# Patient Record
Sex: Male | Born: 1965
Health system: Southern US, Community
[De-identification: ages and names within clinical notes are randomized; demographics above are authoritative.]

## PROBLEM LIST (undated history)

## (undated) DIAGNOSIS — Z973 Presence of spectacles and contact lenses: Secondary | ICD-10-CM

## (undated) DIAGNOSIS — I739 Peripheral vascular disease, unspecified: Secondary | ICD-10-CM

## (undated) DIAGNOSIS — I10 Essential (primary) hypertension: Secondary | ICD-10-CM

## (undated) DIAGNOSIS — R112 Nausea with vomiting, unspecified: Secondary | ICD-10-CM

## (undated) DIAGNOSIS — I779 Disorder of arteries and arterioles, unspecified: Secondary | ICD-10-CM

## (undated) DIAGNOSIS — I2510 Atherosclerotic heart disease of native coronary artery without angina pectoris: Secondary | ICD-10-CM

## (undated) DIAGNOSIS — F32A Depression, unspecified: Secondary | ICD-10-CM

## (undated) DIAGNOSIS — Z87442 Personal history of urinary calculi: Secondary | ICD-10-CM

## (undated) DIAGNOSIS — E785 Hyperlipidemia, unspecified: Secondary | ICD-10-CM

## (undated) DIAGNOSIS — I2089 Other forms of angina pectoris: Secondary | ICD-10-CM

## (undated) DIAGNOSIS — Z9289 Personal history of other medical treatment: Secondary | ICD-10-CM

## (undated) DIAGNOSIS — Z9889 Other specified postprocedural states: Secondary | ICD-10-CM

## (undated) DIAGNOSIS — M1990 Unspecified osteoarthritis, unspecified site: Secondary | ICD-10-CM

## (undated) DIAGNOSIS — F329 Major depressive disorder, single episode, unspecified: Secondary | ICD-10-CM

## (undated) DIAGNOSIS — T148XXA Other injury of unspecified body region, initial encounter: Secondary | ICD-10-CM

## (undated) DIAGNOSIS — I119 Hypertensive heart disease without heart failure: Secondary | ICD-10-CM

## (undated) DIAGNOSIS — I208 Other forms of angina pectoris: Secondary | ICD-10-CM

## (undated) HISTORY — PX: HAND SURGERY: SHX662

## (undated) HISTORY — PX: CORONARY ARTERY BYPASS GRAFT: SHX141

## (undated) HISTORY — PX: COLONOSCOPY: SHX174

## (undated) HISTORY — DX: Hyperlipidemia, unspecified: E78.5

## (undated) HISTORY — PX: SHOULDER SURGERY: SHX246

## (undated) HISTORY — DX: Disorder of arteries and arterioles, unspecified: I77.9

## (undated) HISTORY — DX: Other forms of angina pectoris: I20.89

## (undated) HISTORY — DX: Essential (primary) hypertension: I10

## (undated) HISTORY — DX: Atherosclerotic heart disease of native coronary artery without angina pectoris: I25.10

## (undated) HISTORY — DX: Peripheral vascular disease, unspecified: I73.9

## (undated) HISTORY — DX: Hypertensive heart disease without heart failure: I11.9

## (undated) HISTORY — DX: Personal history of other medical treatment: Z92.89

## (undated) HISTORY — DX: Other forms of angina pectoris: I20.8

## (undated) HISTORY — PX: KIDNEY STONE SURGERY: SHX686

## (undated) HISTORY — PX: HIP ARTHROPLASTY: SHX981

---
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
## 1999-12-27 HISTORY — PX: SHOULDER ARTHROSCOPY: SHX128

## 2003-12-27 HISTORY — PX: CARDIAC CATHETERIZATION: SHX172

## 2011-09-19 ENCOUNTER — Other Ambulatory Visit: Payer: Self-pay | Admitting: Internal Medicine

## 2011-09-19 DIAGNOSIS — E230 Hypopituitarism: Secondary | ICD-10-CM

## 2011-09-27 ENCOUNTER — Ambulatory Visit (HOSPITAL_COMMUNITY)
Admission: RE | Admit: 2011-09-27 | Discharge: 2011-09-27 | Disposition: A | Discharge status: Home / Self Care | Payer: PRIVATE HEALTH INSURANCE | Source: Ambulatory Visit | Attending: Internal Medicine | Admitting: Internal Medicine

## 2011-09-27 DIAGNOSIS — R930 Abnormal findings on diagnostic imaging of skull and head, not elsewhere classified: Secondary | ICD-10-CM | POA: Insufficient documentation

## 2011-09-27 DIAGNOSIS — E230 Hypopituitarism: Secondary | ICD-10-CM

## 2011-09-27 DIAGNOSIS — E291 Testicular hypofunction: Secondary | ICD-10-CM | POA: Insufficient documentation

## 2011-09-27 LAB — CREATININE, SERUM
Creatinine, Ser: 1.13 mg/dL (ref 0.50–1.35)
GFR calc Af Amer: 88 mL/min — ABNORMAL LOW (ref >90)
GFR calc non Af Amer: 76 mL/min — ABNORMAL LOW (ref >90)

## 2011-09-27 MED ORDER — GADOBENATE DIMEGLUMINE 529 MG/ML IV SOLN
20.0000 mL | Freq: Once | INTRAVENOUS | Status: AC
  Administered 2011-09-27: 20 mL via INTRAVENOUS

## 2013-06-05 ENCOUNTER — Other Ambulatory Visit: Payer: Self-pay | Admitting: Internal Medicine

## 2013-06-05 NOTE — Telephone Encounter (Signed)
Need approval or denial

## 2013-06-07 NOTE — Telephone Encounter (Signed)
Just checked with the pharmacist-his medicine is still not ready-Please call-need his medicine for the week-end-Please call today!

## 2013-06-10 NOTE — Telephone Encounter (Signed)
Still have not gotten his Ambien-been waiting since last week-please call this in today-[phone]!

## 2013-06-10 NOTE — Telephone Encounter (Signed)
Advise on Ambien refills

## 2013-06-11 NOTE — Telephone Encounter (Signed)
He will need a "hard" Rx for ambien. I can sign in the office on Thursday.  -[place]

## 2013-06-12 NOTE — Telephone Encounter (Signed)
Rx for [facility] faxed to [facility] [address]

## 2013-06-12 NOTE — Telephone Encounter (Signed)
Done

## 2013-06-20 ENCOUNTER — Ambulatory Visit: Payer: PRIVATE HEALTH INSURANCE | Admitting: Internal Medicine

## 2013-06-21 ENCOUNTER — Other Ambulatory Visit: Payer: Self-pay | Admitting: Internal Medicine

## 2013-06-21 NOTE — Telephone Encounter (Signed)
Rx was sent to pharmacy electronically. 

## 2013-06-26 ENCOUNTER — Encounter: Payer: Self-pay | Admitting: Internal Medicine

## 2013-06-26 ENCOUNTER — Ambulatory Visit (INDEPENDENT_AMBULATORY_CARE_PROVIDER_SITE_OTHER): Payer: Managed Care, Other (non HMO) | Admitting: Internal Medicine

## 2013-06-26 VITALS — BP 140/100 | HR 78 | Ht 69.0 in | Wt 230.6 lb

## 2013-06-26 DIAGNOSIS — E785 Hyperlipidemia, unspecified: Secondary | ICD-10-CM

## 2013-06-26 DIAGNOSIS — I209 Angina pectoris, unspecified: Secondary | ICD-10-CM

## 2013-06-26 DIAGNOSIS — R0989 Other specified symptoms and signs involving the circulatory and respiratory systems: Secondary | ICD-10-CM

## 2013-06-26 DIAGNOSIS — R079 Chest pain, unspecified: Secondary | ICD-10-CM

## 2013-06-26 DIAGNOSIS — I10 Essential (primary) hypertension: Secondary | ICD-10-CM

## 2013-06-26 DIAGNOSIS — I208 Other forms of angina pectoris: Secondary | ICD-10-CM | POA: Insufficient documentation

## 2013-06-26 DIAGNOSIS — E669 Obesity, unspecified: Secondary | ICD-10-CM

## 2013-06-26 MED ORDER — ATORVASTATIN CALCIUM 80 MG PO TABS: ORAL_TABLET | ORAL | Status: DC

## 2013-06-26 MED ORDER — RANOLAZINE ER 1000 MG PO TB12: 1000.0000 mg | ORAL_TABLET | Freq: Two times a day (BID) | ORAL | Status: DC

## 2013-06-26 NOTE — Patient Instructions (Addendum)
Your physician has requested that you have a carotid duplex. This test is an ultrasound of the carotid arteries in your neck. It looks at blood flow through these arteries that supply the brain with blood. Allow one hour for this exam. There are no restrictions or special instructions.  We have sent a refill for Ranexa to your pharmacy.   Your physician wants you to follow-up in 2-3 weeks.

## 2013-06-26 NOTE — Progress Notes (Signed)
OFFICE NOTE  Chief Complaint:  Followup  Primary Care Physician: Romeo Rabon, MD  HPI:  Kenneth [name] is a 47 year old male who had been seen by [place] in consult previously. Dr. Rennis Golden saw him in August 2012. He had been having angina. He reportedly had normal epicardial coronaries at catheterization in [place]. He had an extensive workup there that included cardiac and GI evaluation. He was diagnosed with microvascular angina. He had been maintained on nitroglycerin, which helped. He actually even had EECP in the past which he said helped as well. He recently moved to the area and wanted to get established with a cardiologist. Dr. Rennis Golden recommended resuming his Ranexa. He also resumed his nitroglycerin patch and refilled his amlodipine. No functional studies have been done at our office. He was referred back to Dr. Yetta Barre for followup. He saw Corine Shelter, PA-C recently due to an elevated blood pressure. He has had some vague headache with this but no shortness of breath or chest pain. He had stopped taking his lisinopril. Since resumed taking his lisinopril, but his blood pressure remains elevated. Today the blood pressure was 140/100 in the left arm and 136/96 in the right arm.  [name] is also complaining about left neck pain and the feeling of pulsation in his left neck. A soft bruit has been noted at the left neck base in the past, but no evaluation has been performed. Fortunately he recently has Nurse, learning disability and is interested in restarting his Ranexa.  He has also lost 20-30 pounds recently which should help his blood pressure.  PMHx:  Past Medical History  Diagnosis Date  . Syndrome X, cardiac   . Hypertension   . Hyperlipidemia     Past Surgical History  Procedure Laterality Date  . Kidney stone surgery  age 68  . Shoulder arthroscopy  2001    L shoulder  . Cardiac catheterization  2005    FAMHx:  Family History  Problem Relation Age of Onset  . Heart attack  Father   . Cancer Father     lung  . Heart attack Mother   . Stroke Mother   . Diabetes Mother     SOCHx:   reports that he quit smoking about a year ago. His smoking use included Cigars. He has never used smokeless tobacco. He reports that he does not drink alcohol or use illicit drugs.  ALLERGIES:  Allergies  Allergen Reactions  . Tetracyclines & Related Nausea And Vomiting    ROS: A comprehensive review of systems was negative except for: Cardiovascular: positive for chest pain Musculoskeletal: positive for neck pain  HOME MEDS: Current Outpatient Prescriptions  Medication Sig Dispense Refill  . aspirin 325 MG tablet Take 325 mg by mouth 2 (two) times daily.      Marland Kitchen atorvastatin (LIPITOR) 80 MG tablet TAKE 1 TABLET AT BEDTIME  90 tablet  3  . gemfibrozil (LOPID) 600 MG tablet Take 600 mg by mouth 2 (two) times daily before a meal.      . lisinopril (PRINIVIL,ZESTRIL) 20 MG tablet Take 20 mg by mouth daily.      . nitroGLYCERIN (NITRODUR - DOSED IN MG/24 HR) 0.4 mg/hr Place 1 patch onto the skin daily.      . nitroGLYCERIN (NITROSTAT) 0.4 MG SL tablet Place 0.4 mg under the tongue every 5 (five) minutes as needed for chest pain.      . ranolazine (RANEXA) 1000 MG SR tablet Take 1 tablet (1,000 mg total) by mouth 2 (  two) times daily.  56 tablet  0  . TESTOSTERONE IM Inject 1.5 mLs into the muscle every 14 (fourteen) days.      Marland Kitchen zolpidem (AMBIEN) 10 MG tablet TAKE 1 TABLET AT BEDTIME  30 tablet  5   No current facility-administered medications for this visit.    LABS/IMAGING: No results found for this or any previous visit (from the past 48 hour(s)). No results found.  VITALS: BP 140/100  Pulse 78  Ht 5\' 9"  (1.753 m)  Wt 230 lb 9.6 oz (104.599 kg)  BMI 34.04 kg/m2  EXAM: General appearance: alert and no distress Neck: no adenopathy, no JVD, supple, symmetrical, trachea midline, thyroid not enlarged, symmetric, no tenderness/mass/nodules and Soft bruit at the left  neck base Lungs: clear to auscultation bilaterally Heart: regular rate and rhythm, S1, S2 normal, no murmur, click, rub or gallop Abdomen: soft, non-tender; bowel sounds normal; no masses,  no organomegaly Extremities: extremities normal, atraumatic, no cyanosis or edema Pulses: 2+ and symmetric Skin: Skin color, texture, turgor normal. No rashes or lesions Neurologic: Grossly normal  EKG: Normal sinus rhythm at 78  ASSESSMENT: 1. Microvascular angina 2. Hypertension-uncontrolled 3. Hyperlipidemia 4. Obesity  PLAN: 1.   Mr. Piccininni's blood pressure is improved over his last visit since restarting lisinopril, but it is not at goal. He continues to have some mild angina, but it is improved. He wishes to restart his Ranexa now that he is insurance and we will provide him with samples and a co-pay card today. I think part of his issue is high LVEDP, and he certainly could have better blood pressure control. Process seems to be a differential between both arms and blood pressure, and his could be related to the soft bruit which may in fact be subclavian stenosis. I would like to obtain a carotid Dopplers to further evaluate this. We'll see him back in a few weeks and recheck his bilateral blood pressures. We may have to add low-dose HCTZ to his lisinopril for better blood pressure control.  Chrystie Nose, MD, [facility] Attending Cardiologist [facility]  Juanmanuel Marohl C 06/26/2013, 5:19 PM

## 2013-06-27 ENCOUNTER — Other Ambulatory Visit: Payer: Self-pay | Admitting: *Deleted

## 2013-06-27 MED ORDER — LISINOPRIL 20 MG PO TABS: 20.0000 mg | ORAL_TABLET | Freq: Every day | ORAL | Status: DC

## 2013-06-27 NOTE — Telephone Encounter (Signed)
Rx refill sent to pharmacy electronically.  

## 2013-07-05 ENCOUNTER — Encounter (HOSPITAL_COMMUNITY): Payer: Managed Care, Other (non HMO)

## 2013-07-09 ENCOUNTER — Ambulatory Visit (HOSPITAL_COMMUNITY)
Admission: RE | Admit: 2013-07-09 | Discharge: 2013-07-09 | Disposition: A | Discharge status: Home / Self Care | Payer: Managed Care, Other (non HMO) | Source: Ambulatory Visit | Attending: Internal Medicine | Admitting: Internal Medicine

## 2013-07-09 DIAGNOSIS — R0989 Other specified symptoms and signs involving the circulatory and respiratory systems: Secondary | ICD-10-CM | POA: Insufficient documentation

## 2013-07-09 NOTE — Progress Notes (Signed)
Carotid Duplex Completed. °Kenneth [name] ° °

## 2013-07-16 ENCOUNTER — Encounter: Payer: Self-pay | Admitting: *Deleted

## 2013-07-18 ENCOUNTER — Ambulatory Visit (INDEPENDENT_AMBULATORY_CARE_PROVIDER_SITE_OTHER): Payer: Managed Care, Other (non HMO) | Admitting: Internal Medicine

## 2013-07-18 ENCOUNTER — Encounter: Payer: Self-pay | Admitting: Internal Medicine

## 2013-07-18 VITALS — BP 130/82 | HR 72 | Ht 69.0 in | Wt 229.5 lb

## 2013-07-18 DIAGNOSIS — E785 Hyperlipidemia, unspecified: Secondary | ICD-10-CM

## 2013-07-18 DIAGNOSIS — I208 Other forms of angina pectoris: Secondary | ICD-10-CM

## 2013-07-18 DIAGNOSIS — I209 Angina pectoris, unspecified: Secondary | ICD-10-CM

## 2013-07-18 DIAGNOSIS — I10 Essential (primary) hypertension: Secondary | ICD-10-CM

## 2013-07-18 DIAGNOSIS — M542 Cervicalgia: Secondary | ICD-10-CM

## 2013-07-18 NOTE — Patient Instructions (Addendum)
Your physician wants you to follow-up in: 1 year. You will receive a reminder letter in the mail two months in advance. If you don't receive a letter, please call our office to schedule the follow-up appointment.  Dr Rennis Golden has referred you to Dr. Gerlene Fee at [facility]

## 2013-07-18 NOTE — Progress Notes (Signed)
OFFICE NOTE  Chief Complaint:  Followup  Primary Care Physician: Romeo Rabon, MD  HPI:  Kenneth [name] is a 47 year old male who had been seen by [place] in consult previously. Dr. Rennis Golden saw him in August 2012. He had been having angina. He reportedly had normal epicardial coronaries at catheterization in [place]. He had an extensive workup there that included cardiac and GI evaluation. He was diagnosed with microvascular angina. He had been maintained on nitroglycerin, which helped. He actually even had EECP in the past which he said helped as well. He recently moved to the area and wanted to get established with a cardiologist. Dr. Rennis Golden recommended resuming his Ranexa. He also resumed his nitroglycerin patch and refilled his amlodipine. No functional studies have been done at our office. He was referred back to Dr. Yetta Barre for followup. He saw Corine Shelter, PA-C recently due to an elevated blood pressure. He has had some vague headache with this but no shortness of breath or chest pain. He had stopped taking his lisinopril. Since resumed taking his lisinopril, but his blood pressure remains elevated. Today the blood pressure was 140/100 in the left arm and 136/96 in the right arm.  Kenneth [name] is also complaining about left neck pain and the feeling of pulsation in his left neck. A soft bruit has been noted at the left neck base in the past, but no evaluation has been performed. Fortunately he recently has Nurse, learning disability and is interested in restarting his Ranexa.  He has also lost 20-30 pounds recently which should help his blood pressure.  At his last office visit, he was complaining of left posterior neck pain that radiated up the back of the left neck base. This is a somewhat throbbing in quality. It was improved with ibuprofen but not nitroglycerin. He is concerned about carotid or vertebral artery disease. We did go ahead and order carotid Dopplers which were negative for blockage, although  he did have mildly elevated velocities in both subclavian arteries.  PMHx:  Past Medical History  Diagnosis Date  . Syndrome X, cardiac   . Hypertension   . Hyperlipidemia   . History of Doppler ultrasound 07/09/2013    carotid doppler; moderate narrowing of both subclavian arteries, with normal carotid arteries    Past Surgical History  Procedure Laterality Date  . Kidney stone surgery  age 75  . Shoulder arthroscopy  2001    L shoulder  . Cardiac catheterization  2005    FAMHx:  Family History  Problem Relation Age of Onset  . Heart attack Father   . Cancer Father     lung  . Heart attack Mother   . Stroke Mother   . Diabetes Mother     SOCHx:   reports that he quit smoking about 12 months ago. His smoking use included Cigars. He has never used smokeless tobacco. He reports that he does not drink alcohol or use illicit drugs.  ALLERGIES:  Allergies  Allergen Reactions  . Tetracyclines & Related Nausea And Vomiting    ROS: A comprehensive review of systems was negative except for: Cardiovascular: positive for chest pain Musculoskeletal: positive for neck pain  HOME MEDS: Current Outpatient Prescriptions  Medication Sig Dispense Refill  . aspirin 325 MG tablet Take 325 mg by mouth 2 (two) times daily.      Marland Kitchen atorvastatin (LIPITOR) 80 MG tablet TAKE 1 TABLET AT BEDTIME  90 tablet  3  . gemfibrozil (LOPID) 600 MG tablet Take 600 mg by mouth  2 (two) times daily before a meal.      . lisinopril (PRINIVIL,ZESTRIL) 20 MG tablet Take 1 tablet (20 mg total) by mouth daily.  90 tablet  6  . nitroGLYCERIN (NITRODUR - DOSED IN MG/24 HR) 0.4 mg/hr Place 1 patch onto the skin daily.      . nitroGLYCERIN (NITROSTAT) 0.4 MG SL tablet Place 0.4 mg under the tongue every 5 (five) minutes as needed for chest pain.      . ranolazine (RANEXA) 1000 MG SR tablet Take 1 tablet (1,000 mg total) by mouth 2 (two) times daily.  56 tablet  0  . TESTOSTERONE IM Inject 1.5 mLs into the muscle  every 14 (fourteen) days.      Marland Kitchen zolpidem (AMBIEN) 10 MG tablet TAKE 1 TABLET AT BEDTIME  30 tablet  5   No current facility-administered medications for this visit.    LABS/IMAGING: No results found for this or any previous visit (from the past 48 hour(s)). No results found.  VITALS: BP 130/82  Pulse 72  Ht 5\' 9"  (1.753 m)  Wt 229 lb 8 oz (104.101 kg)  BMI 33.88 kg/m2  EXAM: deferred  EKG: deferred  ASSESSMENT: 1. Microvascular angina 2. Hypertension-uncontrolled 3. Hyperlipidemia 4. Obesity  PLAN: 1.   Kenneth [name]'s carotid Dopplers are essentially normal. 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 I recommended that he see a neurosurgeon (Dr. Gerlene Fee) for evaluation of cervical neuralgia. He may benefit from further imaging, physical therapy and/or injections. His blood pressure is better controlled today, therefore will continue his current medications. He continues to be angina free and exercise regularly. Plan to see him back annually or sooner as necessary.  Chrystie Nose, MD, [facility] Attending Cardiologist [facility]  Elza Sortor C 07/18/2013, 2:03 PM

## 2013-09-15 ENCOUNTER — Other Ambulatory Visit: Payer: Self-pay | Admitting: Internal Medicine

## 2013-09-16 NOTE — Telephone Encounter (Signed)
Rx was sent to pharmacy electronically. 

## 2013-11-01 ENCOUNTER — Other Ambulatory Visit: Payer: Self-pay | Admitting: Internal Medicine

## 2013-11-01 NOTE — Telephone Encounter (Signed)
Rx was sent to pharmacy electronically. 

## 2013-11-12 ENCOUNTER — Other Ambulatory Visit: Payer: Self-pay | Admitting: *Deleted

## 2013-11-12 MED ORDER — ZOLPIDEM TARTRATE 10 MG PO TABS: 10.0000 mg | ORAL_TABLET | Freq: Every evening | ORAL | Status: DC | PRN

## 2013-11-12 NOTE — Telephone Encounter (Signed)
Medication refilled #30 with 5 refills. Printed and faxed to [facility] [address] [place], [place]

## 2013-12-09 ENCOUNTER — Telehealth: Payer: Self-pay | Admitting: Internal Medicine

## 2013-12-09 MED ORDER — NITROGLYCERIN 0.4 MG SL SUBL: 0.4000 mg | SUBLINGUAL_TABLET | SUBLINGUAL | Status: DC | PRN

## 2013-12-09 NOTE — Telephone Encounter (Signed)
Message forwarded to J. Elkins, RN.  

## 2013-12-09 NOTE — Telephone Encounter (Signed)
Need a new prescription for sublingual nitro-glycerin 0.4mg  #100

## 2013-12-09 NOTE — Telephone Encounter (Signed)
Refill for NTG SL since #25 with 3 refills to [facility]

## 2013-12-13 ENCOUNTER — Telehealth: Payer: Self-pay | Admitting: Internal Medicine

## 2013-12-13 MED ORDER — NITROGLYCERIN 0.4 MG SL SUBL: 0.4000 mg | SUBLINGUAL_TABLET | SUBLINGUAL | Status: DC | PRN

## 2013-12-13 NOTE — Telephone Encounter (Signed)
Need new perscriptiion for Nitro stat.4mg  #100

## 2013-12-13 NOTE — Telephone Encounter (Signed)
Attempted to call patient to ask how often NTG is being taken - voicemail box is full

## 2013-12-13 NOTE — Telephone Encounter (Signed)
Refill sent for #25 w/ 3 refills on 12.15.14.  Pharmacy asking for #100.  Message forwarded to J. Jeannetta Nap, Charity fundraiser.

## 2013-12-13 NOTE — Telephone Encounter (Signed)
Rx was sent to pharmacy electronically. 

## 2014-01-28 ENCOUNTER — Encounter: Payer: Self-pay | Admitting: Internal Medicine

## 2014-01-28 ENCOUNTER — Encounter (HOSPITAL_COMMUNITY): Payer: Self-pay | Admitting: *Deleted

## 2014-01-28 ENCOUNTER — Ambulatory Visit (INDEPENDENT_AMBULATORY_CARE_PROVIDER_SITE_OTHER): Payer: Managed Care, Other (non HMO) | Admitting: Internal Medicine

## 2014-01-28 VITALS — BP 120/80 | HR 69 | Ht 69.0 in | Wt 223.9 lb

## 2014-01-28 DIAGNOSIS — R079 Chest pain, unspecified: Secondary | ICD-10-CM

## 2014-01-28 DIAGNOSIS — E785 Hyperlipidemia, unspecified: Secondary | ICD-10-CM

## 2014-01-28 DIAGNOSIS — I200 Unstable angina: Secondary | ICD-10-CM | POA: Insufficient documentation

## 2014-01-28 DIAGNOSIS — E669 Obesity, unspecified: Secondary | ICD-10-CM

## 2014-01-28 DIAGNOSIS — I10 Essential (primary) hypertension: Secondary | ICD-10-CM

## 2014-01-28 MED ORDER — NITROGLYCERIN 0.4 MG SL SUBL: 0.4000 mg | SUBLINGUAL_TABLET | SUBLINGUAL | Status: DC | PRN

## 2014-01-28 NOTE — Patient Instructions (Signed)
Your physician wants you to follow-up in: 6 months. You will receive a reminder letter in the mail two months in advance. If you don't receive a letter, please call our office to schedule the follow-up appointment.  Your physician has requested that you have en exercise stress myoview. For further information please visit [email or web address]. Please follow instruction sheet, as given.  Your physician recommends that you return for lab work at your earliest convenience. You will need to be fasting.

## 2014-01-28 NOTE — Progress Notes (Signed)
OFFICE NOTE  Chief Complaint:  Followup  Primary Care Physician: Moshe Cipro, MD  HPI:  Kenneth [name] is a 48 year old male who had been seen by [place] in consult previously. Dr. Debara Pickett saw him in August 2012. He had been having angina. He reportedly had normal epicardial coronaries at catheterization in [place]. He had an extensive workup there that included cardiac and GI evaluation. He was diagnosed with microvascular angina. He had been maintained on nitroglycerin, which helped. He actually even had EECP in the past which he said helped as well. He recently moved to the area and wanted to get established with a cardiologist. Dr. Debara Pickett recommended resuming his Ranexa. He also resumed his nitroglycerin patch and refilled his amlodipine. No functional studies have been done at our office. He was referred back to Dr. Ronnald Ramp for followup.  Since that time his blood pressure had improved significantly. Today is 120/80 as mentioned his weight has come down to 223.  He reports however about 2 weeks ago he thinks he "got out of [place] with his medications. He ran out of his nitroglycerin patch and was taking more short acting nitroglycerin. He says reestablished his medicines, but reports that he continues to have some worsening chest pain and shortness of breath. He continues to exercise but is noticing more anginal symptoms earlier in his exercise.  PMHx:  Past Medical History  Diagnosis Date  . Syndrome X, cardiac   . Hypertension   . Hyperlipidemia   . History of Doppler ultrasound 07/09/2013    carotid doppler; moderate narrowing of both subclavian arteries, with normal carotid arteries    Past Surgical History  Procedure Laterality Date  . Kidney stone surgery  age 13  . Shoulder arthroscopy  2001    L shoulder  . Cardiac catheterization  2005    FAMHx:  Family History  Problem Relation Age of Onset  . Heart attack Father   . Cancer Father     lung  . Heart attack Mother     . Stroke Mother   . Diabetes Mother     SOCHx:   reports that he quit smoking about 19 months ago. His smoking use included Cigars. He has never used smokeless tobacco. He reports that he does not drink alcohol or use illicit drugs.  ALLERGIES:  Allergies  Allergen Reactions  . Tetracyclines & Related Nausea And Vomiting    ROS: A comprehensive review of systems was negative except for: Cardiovascular: positive for exertional chest pressure/discomfort  HOME MEDS: Current Outpatient Prescriptions  Medication Sig Dispense Refill  . aspirin 81 MG tablet Take 81 mg by mouth 2 (two) times daily.      Marland Kitchen atorvastatin (LIPITOR) 80 MG tablet TAKE 1 TABLET AT BEDTIME  90 tablet  3  . gemfibrozil (LOPID) 600 MG tablet TAKE 1 TABLET BY MOUTH TWICE A DAY  60 tablet  6  . lisinopril (PRINIVIL,ZESTRIL) 20 MG tablet Take 1 tablet (20 mg total) by mouth daily.  90 tablet  6  . nitroGLYCERIN (NITRODUR - DOSED IN MG/24 HR) 0.4 mg/hr patch APPLY 2 PATCHES TO SKIN EVERY DAY  60 patch  4  . nitroGLYCERIN (NITROSTAT) 0.4 MG SL tablet Place 1 tablet (0.4 mg total) under the tongue every 5 (five) minutes as needed for chest pain.  100 tablet  2  . ranolazine (RANEXA) 1000 MG SR tablet Take 1 tablet (1,000 mg total) by mouth 2 (two) times daily.  56 tablet  0  . TESTOSTERONE  IM Inject 1.5 mLs into the muscle every 14 (fourteen) days.      Marland Kitchen zolpidem (AMBIEN) 10 MG tablet Take 1 tablet (10 mg total) by mouth at bedtime as needed for sleep.  30 tablet  5   No current facility-administered medications for this visit.    LABS/IMAGING: No results found for this or any previous visit (from the past 48 hour(s)). No results found.  VITALS: BP 120/80  Pulse 69  Ht 5\' 9"  (1.753 m)  Wt 223 lb 14.4 oz (101.56 kg)  BMI 33.05 kg/m2  EXAM: General appearance: alert and no distress Neck: no adenopathy, no JVD, supple, symmetrical, trachea midline, thyroid not enlarged, symmetric, no tenderness/mass/nodules and  Soft bruit at the left neck base Lungs: clear to auscultation bilaterally Heart: regular rate and rhythm, S1, S2 normal, no murmur, click, rub or gallop Abdomen: soft, non-tender; bowel sounds normal; no masses,  no organomegaly Extremities: extremities normal, atraumatic, no cyanosis or edema Pulses: 2+ and symmetric Skin: Skin color, texture, turgor normal. No rashes or lesions Neurologic: Grossly normal  EKG: Normal sinus rhythm at 69  ASSESSMENT: 1. Unstable angina 2. History of microvascular angina (normal coronaries by cath in 2010) - had EECP in the past 3. Hypertension-uncontrolled 4. Hyperlipidemia 5. Obesity  PLAN: 1.   Kenneth [name] is describing accelerating chest pain over the past few weeks for which he has scheduled this appointment today. He thought at first it may be due to coming off of his medicines or been out of them for short period of time, but after reestablishing his medicines his symptoms are predictably getting worse. His last stress test was in 2011 after cardiac catheterization in 2010 which was essentially normal. He has not had any workup as far as imaging test in our office since I've been seeing him in 2012. I therefore recommend a exercise nuclear stress test to further evaluate for the possible development of coronary disease, especially given his strong family history of heart disease. He is also due for another cholesterol check which will order this time. I'll contact her with the results of his stress test and cholesterol and make adjustments or further tests as necessary.  Pixie Casino, MD, [facility] Attending Cardiologist [facility] [place] [facility] 01/28/2014, 1:11 PM

## 2014-01-30 ENCOUNTER — Other Ambulatory Visit: Payer: Self-pay | Admitting: Internal Medicine

## 2014-01-30 LAB — COMPREHENSIVE METABOLIC PANEL
ALK PHOS: 45 U/L (ref 39–117)
ALT: 12 U/L (ref 0–53)
AST: 17 U/L (ref 0–37)
Albumin: 4.3 g/dL (ref 3.5–5.2)
BILIRUBIN TOTAL: 0.5 mg/dL (ref 0.2–1.2)
BUN: 23 mg/dL (ref 6–23)
CO2: 26 mEq/L (ref 19–32)
CREATININE: 1.13 mg/dL (ref 0.50–1.35)
Calcium: 9.7 mg/dL (ref 8.4–10.5)
Chloride: 102 mEq/L (ref 96–112)
Glucose, Bld: 98 mg/dL (ref 70–99)
Potassium: 4.3 mEq/L (ref 3.5–5.3)
SODIUM: 138 meq/L (ref 135–145)
TOTAL PROTEIN: 7.6 g/dL (ref 6.0–8.3)

## 2014-01-30 LAB — PSA: PSA: 0.96 ng/mL (ref <=4.00)

## 2014-01-30 LAB — CBC
HCT: 41.9 % (ref 39.0–52.0)
Hemoglobin: 14.7 g/dL (ref 13.0–17.0)
MCH: 30.7 pg (ref 26.0–34.0)
MCHC: 35.1 g/dL (ref 30.0–36.0)
MCV: 87.5 fL (ref 78.0–100.0)
PLATELETS: 288 10*3/uL (ref 150–400)
RBC: 4.79 MIL/uL (ref 4.22–5.81)
RDW: 13.9 % (ref 11.5–15.5)
WBC: 5.8 10*3/uL (ref 4.0–10.5)

## 2014-01-31 ENCOUNTER — Encounter (HOSPITAL_COMMUNITY): Payer: Managed Care, Other (non HMO)

## 2014-01-31 ENCOUNTER — Telehealth (HOSPITAL_COMMUNITY): Payer: Self-pay | Admitting: *Deleted

## 2014-01-31 LAB — NMR LIPOPROFILE WITH LIPIDS
CHOLESTEROL, TOTAL: 318 mg/dL — AB (ref <200)
HDL Particle Number: 22.8 umol/L — ABNORMAL LOW (ref >=30.5)
HDL Size: 7.8 nm — ABNORMAL LOW (ref >=9.2)
HDL-C: 42 mg/dL (ref >=40)
LDL CALC: 256 mg/dL — AB (ref <100)
LDL PARTICLE NUMBER: 2790 nmol/L — AB (ref <1000)
LDL Size: 21.1 nm (ref >20.5)
LP-IR SCORE: 43 (ref <=45)
Large HDL-P: <1.3 umol/L — ABNORMAL LOW (ref >=4.8)
Large VLDL-P: <0.8 nmol/L (ref <=2.7)
Small LDL Particle Number: 1327 nmol/L — ABNORMAL HIGH (ref <=527)
TRIGLYCERIDES: 102 mg/dL (ref <150)
VLDL SIZE: 35.5 nm (ref <=46.6)

## 2014-01-31 LAB — TESTOSTERONE, FREE, TOTAL, SHBG
SEX HORMONE BINDING: 13 nmol/L (ref 13–71)
TESTOSTERONE FREE: 82.8 pg/mL (ref 47.0–244.0)
TESTOSTERONE: 278 ng/dL — AB (ref 300–890)
Testosterone-% Free: 3 % — ABNORMAL HIGH (ref 1.6–2.9)

## 2014-02-06 NOTE — Progress Notes (Signed)
LMTCB

## 2014-02-10 ENCOUNTER — Telehealth (HOSPITAL_COMMUNITY): Payer: Self-pay | Admitting: *Deleted

## 2014-02-12 ENCOUNTER — Other Ambulatory Visit: Payer: Self-pay | Admitting: *Deleted

## 2014-02-12 DIAGNOSIS — E785 Hyperlipidemia, unspecified: Secondary | ICD-10-CM

## 2014-02-13 ENCOUNTER — Telehealth (HOSPITAL_COMMUNITY): Payer: Self-pay | Admitting: *Deleted

## 2014-02-26 ENCOUNTER — Telehealth: Payer: Self-pay | Admitting: *Deleted

## 2014-02-26 NOTE — Telephone Encounter (Signed)
Message copied by Maryan Puls on Wed Feb 26, 2014 11:16 AM ------      Message from: Fidel Levy      Created: Wed Feb 12, 2014 11:54 AM      Regarding: reschedule stress test/follow up       Kenneth [name] - patient needs to reschedule nuclear stress test            Otila Kluver - will need follow up after stress test ------

## 2014-03-07 ENCOUNTER — Telehealth (HOSPITAL_COMMUNITY): Payer: Self-pay

## 2014-03-11 ENCOUNTER — Encounter (HOSPITAL_COMMUNITY): Payer: Self-pay | Admitting: *Deleted

## 2014-03-12 ENCOUNTER — Ambulatory Visit (INDEPENDENT_AMBULATORY_CARE_PROVIDER_SITE_OTHER): Payer: Managed Care, Other (non HMO) | Admitting: Cardiovascular Disease

## 2014-03-12 ENCOUNTER — Encounter: Payer: Self-pay | Admitting: Cardiovascular Disease

## 2014-03-12 ENCOUNTER — Ambulatory Visit (HOSPITAL_COMMUNITY)
Admission: RE | Admit: 2014-03-12 | Discharge: 2014-03-12 | Disposition: A | Discharge status: Home / Self Care | Payer: Managed Care, Other (non HMO) | Source: Ambulatory Visit | Attending: Cardiovascular Disease | Admitting: Cardiovascular Disease

## 2014-03-12 VITALS — BP 131/84 | HR 84 | Ht 69.0 in | Wt 219.2 lb

## 2014-03-12 DIAGNOSIS — E669 Obesity, unspecified: Secondary | ICD-10-CM

## 2014-03-12 DIAGNOSIS — R9439 Abnormal result of other cardiovascular function study: Secondary | ICD-10-CM

## 2014-03-12 DIAGNOSIS — R5383 Other fatigue: Secondary | ICD-10-CM

## 2014-03-12 DIAGNOSIS — E785 Hyperlipidemia, unspecified: Secondary | ICD-10-CM

## 2014-03-12 DIAGNOSIS — D689 Coagulation defect, unspecified: Secondary | ICD-10-CM

## 2014-03-12 DIAGNOSIS — R079 Chest pain, unspecified: Secondary | ICD-10-CM | POA: Insufficient documentation

## 2014-03-12 DIAGNOSIS — E782 Mixed hyperlipidemia: Secondary | ICD-10-CM

## 2014-03-12 DIAGNOSIS — R5381 Other malaise: Secondary | ICD-10-CM

## 2014-03-12 DIAGNOSIS — I10 Essential (primary) hypertension: Secondary | ICD-10-CM

## 2014-03-12 MED ORDER — TECHNETIUM TC 99M SESTAMIBI GENERIC - CARDIOLITE
10.0000 | Freq: Once | INTRAVENOUS | Status: AC | PRN
  Administered 2014-03-12: 10 via INTRAVENOUS

## 2014-03-12 MED ORDER — METOPROLOL TARTRATE 25 MG PO TABS: 25.0000 mg | ORAL_TABLET | Freq: Two times a day (BID) | ORAL | Status: DC

## 2014-03-12 MED ORDER — TECHNETIUM TC 99M SESTAMIBI GENERIC - CARDIOLITE
30.0000 | Freq: Once | INTRAVENOUS | Status: AC | PRN
  Administered 2014-03-12: 30 via INTRAVENOUS

## 2014-03-12 NOTE — Patient Instructions (Signed)
Your physician has requested that you have a cardiac catheterization. Cardiac catheterization is used to diagnose and/or treat various heart conditions. Doctors may recommend this procedure for a number of different reasons. The most common reason is to evaluate chest pain. Chest pain can be a symptom of coronary artery disease (CAD), and cardiac catheterization can show whether plaque is narrowing or blocking your heart's arteries. This procedure is also used to evaluate the valves, as well as measure the blood flow and oxygen levels in different parts of your heart. For further information please visit [email or web address]. Please follow instruction sheet, as given.   A chest x-ray takes a picture of the organs and structures inside the chest, including the heart, lungs, and blood vessels. This test can show several things, including, whether the heart is enlarges; whether fluid is building up in the lungs; and whether pacemaker / defibrillator leads are still in place.  Your physician recommends that you return for lab work tomorrow.  Your physician recommends that you schedule a follow-up appointment in: 2 weeks with Dr. Debara Pickett or extender.

## 2014-03-12 NOTE — Progress Notes (Signed)
Patient ID: Karas Pickerill, male   DOB: May 19, 1966, 48 y.o.   MRN: [id number]     HPI: Javonta Gronau is a 48 y.o. male who is seen today as an add-on after he underwent an exercise Myoview study which was felt to be high risk.  Mr. Santiel Topper is a 48 year old male who is originally from [place]. He states in 2001 he first developed episodes of intermittent chest discomfort. Over several years he underwent several evaluations both cardiac as well as GI. He underwent a cardiac catheterization at least 5 years previously and was told of having normal epicardial coronary arteries. He was diagnosed with microvascular angina. He has been treated with nitroglycerin with improvement. He also completed in [facility] treatment regimen. Since moving to the [place] area, he had seen Dr.Hilty by his report several years ago and was maintained on medical regimen. Patient has continued to experience exertional chest pain that seems to have increased over the past year. There was a period when he had missed taking some of his medications. He saw Dr. Roanna Epley on 01/28/2014. At that time he describes accelerating chest pain pattern. He was referred for an exercise Myoview study which he had today in our office.  On his exercise stress test, he exercised for 10 minutes and 57 seconds achieved a 12.5 METs workload. He developed chest pain with stress and had a hypertensive diastolic blood pressure response to 140/110. He did not develop significant ST changes of ischemia. However, scintigraphic images were markedly abnormal and a significant stress-induced large in size and severe in intensity defect was present commencing in the distal inferolateral wall extending basally to involve the basal septal inferior inferior extending to the lateral inferior segment. The extent of defect was 26%. There was possible mild basal nontransmural inferior scar. Because of this scan I am now seeing him in the office for followup  evaluation.  Past Medical History  Diagnosis Date  . Syndrome X, cardiac   . Hypertension   . Hyperlipidemia   . History of Doppler ultrasound 07/09/2013    carotid doppler; moderate narrowing of both subclavian arteries, with normal carotid arteries    Past Surgical History  Procedure Laterality Date  . Kidney stone surgery  age 19  . Shoulder arthroscopy  2001    L shoulder  . Cardiac catheterization  2005    Allergies  Allergen Reactions  . Tetracyclines & Related Nausea And Vomiting    Current Outpatient Prescriptions  Medication Sig Dispense Refill  . amLODipine (NORVASC) 5 MG tablet Take 5 mg by mouth daily.      Marland Kitchen aspirin 81 MG tablet Take 81 mg by mouth 2 (two) times daily.      Marland Kitchen atorvastatin (LIPITOR) 80 MG tablet TAKE 1 TABLET AT BEDTIME  90 tablet  3  . gemfibrozil (LOPID) 600 MG tablet TAKE 1 TABLET BY MOUTH TWICE A DAY  60 tablet  6  . lisinopril (PRINIVIL,ZESTRIL) 20 MG tablet Take 1 tablet (20 mg total) by mouth daily.  90 tablet  6  . nitroGLYCERIN (NITRODUR - DOSED IN MG/24 HR) 0.4 mg/hr patch APPLY 2 PATCHES TO SKIN EVERY DAY  60 patch  4  . nitroGLYCERIN (NITROSTAT) 0.4 MG SL tablet Place 1 tablet (0.4 mg total) under the tongue every 5 (five) minutes as needed for chest pain.  100 tablet  2  . ranolazine (RANEXA) 1000 MG SR tablet Take 1 tablet (1,000 mg total) by mouth 2 (two) times daily.  56 tablet  0  . TESTOSTERONE IM Inject 1.5 mLs into the muscle every 14 (fourteen) days.      Marland Kitchen zolpidem (AMBIEN) 10 MG tablet Take 1 tablet (10 mg total) by mouth at bedtime as needed for sleep.  30 tablet  5  . metoprolol tartrate (LOPRESSOR) 25 MG tablet Take 1 tablet (25 mg total) by mouth 2 (two) times daily.  60 tablet  6   No current facility-administered medications for this visit.    History   Social History  . Marital Status: Single    Spouse Name: N/A    Number of Children: N/A  . Years of Education: N/A   Occupational History  . Not on file.    Social History Main Topics  . Smoking status: Former Smoker    Types: Cigars    Quit date: 06/26/2012  . Smokeless tobacco: Never Used  . Alcohol Use: No  . Drug Use: No  . Sexual Activity: Not on file   Other Topics Concern  . Not on file   Social History Narrative  . No narrative on file   Social history is notable that he is in his third marriage. He has 3 children of his own. He quit tobacco in 2013. He works in [employer], political [employer] and [place] to [place] works at home at his computer.   Family History  Problem Relation Age of Onset  . Heart attack Father   . Cancer Father     lung  . Heart attack Mother   . Stroke Mother   . Diabetes Mother     ROS is negative for fevers, chills or night sweats. He denies skin rash. He gets to weight gain over the years. He denies change in vision or hearing. He is unaware of lymphadenopathy. He does note exertional shortness of breath and exertional chest tightness. He denies presyncope or syncope. He denies cough or increased sputum production. He denies wheezing. There is no history of indigestion. He denies nausea vomiting or diarrhea. He is unaware of blood in stool or urine. He denies claudication. He does have hyperlipidemia and a significant history of elevated triglycerides in excess of 400 in the past. He denies edema. He is unaware of sleep disturbed breathing. He is unaware of diabetes or hypothyroidism.  Other comprehensive 14 point system review is negative.  PE BP 131/84  Pulse 84  Ht 5\' 9"  (1.753 m)  Wt 219 lb 3.2 oz (99.428 kg)  BMI 32.36 kg/m2  General: Alert, oriented, no distress.  Skin: normal turgor, no rashes HEENT: Normocephalic, atraumatic. Pupils round and reactive; sclera anicteric;no lid lag. Extraocular muscles intact;; no xanthelasmas. Nose without nasal septal hypertrophy Mouth/Parynx benign; Mallinpatti scale 2 Neck: No JVD, no carotid bruits; normal carotid upstroke Lungs: clear to ausculatation and  percussion; no wheezing or rales Chest wall: no tenderness to palpitation Heart: RRR, s1 s2 normal; faint 1/6 systolic murmur;no diastolic murmur, rub thrills or heaves Abdomen: soft, nontender; no hepatosplenomehaly, BS+; abdominal aorta nontender and not dilated by palpation. Back: no CVA tenderness Pulses 2+ Extremities: no clubbing cyanosis or edema, Homan's sign negative  Neurologic: grossly nonfocal; cranial nerves grossly normal. Psychologic: normal affect and mood.   LABS:  BMET    Component Value Date/Time   NA 138 01/28/2014 0930   K 4.3 01/28/2014 0930   CL 102 01/28/2014 0930   CO2 26 01/28/2014 0930   GLUCOSE 98 01/28/2014 0930   BUN 23 01/28/2014 0930   CREATININE 1.13 01/28/2014 0930  CREATININE 1.13 09/27/2011 1735   CALCIUM 9.7 01/28/2014 0930   GFRNONAA 76* 09/27/2011 1735   GFRAA 88* 09/27/2011 1735     Hepatic Function Panel     Component Value Date/Time   PROT 7.6 01/28/2014 0930   ALBUMIN 4.3 01/28/2014 0930   AST 17 01/28/2014 0930   ALT 12 01/28/2014 0930   ALKPHOS 45 01/28/2014 0930   BILITOT 0.5 01/28/2014 0930     CBC    Component Value Date/Time   WBC 5.8 01/28/2014 0930   RBC 4.79 01/28/2014 0930   HGB 14.7 01/28/2014 0930   HCT 41.9 01/28/2014 0930   PLT 288 01/28/2014 0930   MCV 87.5 01/28/2014 0930   MCH 30.7 01/28/2014 0930   MCHC 35.1 01/28/2014 0930   RDW 13.9 01/28/2014 0930     BNP No results found for this basename: probnp    Lipid Panel     Component Value Date/Time   TRIG 102 01/28/2014 0930   LDLCALC 256* 01/28/2014 0930     RADIOLOGY: No results found.    ASSESSMENT AND PLAN: Mr. Juddson Cobern is a 48 year old male who has a history of chest pain over at least 14 years. Prior workup in [place] apparently did not demonstrate any significant epicardial coronary obstructive disease and he was felt to have microvascular angina. Recently, he has noticed a definite change with the development of increasing exertional chest discomfort. He has been on a  medical regimen consisting of amlodipine 5 mg, ACE inhibition with lisinopril 20 mg, Ranexa 1000 mg twice a day, in addition to two 0.4 mg per hour nitrate  patches. It is does not wear the patch his chest pain symptoms are significantly more frequent and intense. I spent considerable time with him today and reviewed his nuclear perfusion stress test and exercise data. He did experience exercise induced chest pain without definitive ST-T abnormalities on the exercise electrocardiogram. However, scintigraphic images demonstrate significant ischemia highly suggestive of left circumflex or RCA involvement. Presently, I have added low-dose beta blockade with metoprolol tartrate to initiate at 25 mg twice a day. I discussed with him at length definitive cardiac catheterization with need for possible percutaneous coronary intervention. Due to his high risk nuclear perfusion study, I am scheduling this test to be done on this Friday, 03/14/2014. Tomorrow admission laboratory will be obtained. I discussed with him the potential need for percutaneous intervention if high grade obstructive disease is identified. He is aware of the risks benefits of the procedure and wishes to proceed with this definitive evaluation.     Troy Sine, MD, [facility]  03/12/2014 7:14 PM

## 2014-03-12 NOTE — Procedures (Addendum)
[facility] [address] [place] [id number] [phone]  Cardiology Nuclear Med Study  Kenneth [name] is a 48 y.o. male     MRN : [id number]     DOB: 11-19-1966  Procedure Date: 03/12/2014  Nuclear Med Background Indication for Stress Test:  Evaluation for Ischemia and n/a History:  cardiac syndrome x;cath in 2005 and 2010 both normal;microvascular angina; no respiratory history reported.;pt last had NUC study in 2011 Cardiac Risk Factors: Family History - CAD, History of Smoking, Hypertension, Lipids and Obesity  Symptoms:  Chest Pain, DOE, Fatigue, Palpitations and SOB   Nuclear Pre-Procedure Caffeine/Decaff Intake:  1:00am NPO After: 11am   IV Site: R Antecubital  IV 0.9% NS with Angio Cath:  22g  Chest Size (in):  42"  IV Started by: Azucena Cecil, RN  Height: 5\' 9"  (1.753 m)  Cup Size: n/a  BMI:  Body mass index is 32.92 kg/(m^2). Weight:  223 lb (101.152 kg)   Tech Comments:  n/a    Nuclear Med Study 1 or 2 day study: 1 day  Stress Test Type:  Stress  Order Authorizing Provider:  Lyman Bishop, MD   Resting Radionuclide: Technetium 87m Sestamibi  Resting Radionuclide Dose: 10.1 mCi   Stress Radionuclide:  Technetium 68m Sestamibi  Stress Radionuclide Dose: 30.2 mCi           Stress Protocol Rest HR:74 Stress HR: 139  Rest BP:147/92 Stress BP:202/96  Exercise Time (min): 10:57 METS: 12.50   Predicted Max HR: 171 bpm % Max HR: 81.29 bpm Rate Pressure Product: 28078  Dose of Adenosine (mg):  n/a Dose of Lexiscan: n/a mg  Dose of Atropine (mg): n/a Dose of Dobutamine: n/a mcg/kg/min (at max HR)  Stress Test Technologist: Mellody Memos, CCT Nuclear Technologist: Otho Perl, CNMT   Rest Procedure:  Myocardial perfusion imaging was performed at rest 45 minutes following the intravenous administration of Technetium 41m Sestamibi. Stress Procedure:  The patient performed treadmill exercise using a Bruce   Protocol for 10 minutes and 57 seconds. The patient stopped due to chest pain, shortness of breath and fatigue.Patient did complain of left sided chest pain and tightness.  There were no significant ST-T wave changes.  Technetium 61m Sestamibi was injected at peak exercise and myocardial perfusion imaging was performed after a brief delay.  Transient Ischemic Dilatation (Normal <1.22):  0.84 Lung/Heart Ratio (Normal <0.45):  0.31 QGS EDV:  133 ml QGS ESV:  71 ml LV Ejection Fraction: 47%      Rest ECG: NSR - Normal EKG; Q wave in avR and avL  Stress ECG: No significant change from baseline ECG  QPS Raw Data Images:  Normal; no motion artifact; normal heart/lung ratio. Stress Images:  Large,severe defect commencing in the diatal inferolateral segment and extending proximally to the basal inferoseptal, inferior, inferolateral and lateral inferior wall (extent 26%) Rest Images:  Significantly improved perfusion with mild residual basal inferior defect. Subtraction (SDS):  large area of inferior to inferolateral ischemia with mild basal inferior scar.   Impression Exercise Capacity:  Good exercise capacity. BP Response:  Hypertensive diastolic BP response to 355/732 Clinical Symptoms:  Typical chest pain. ECG Impression:  No significant ST segment change suggestive of ischemia. Comparison with Prior Nuclear Study: No images to compare  Overall Impression:  High risk stress nuclear study demonstrating a large in size and severe ischemic defect commencing in the inferolateral wall distally and extending to the basal inferoseptal, inferior, and  lateral inferior walls.  LV Wall Motion:  Mildly reduced EF at 47% without definitive wall motion abnormality   Troy Sine, MD  03/12/2014 5:57 PM

## 2014-03-13 ENCOUNTER — Encounter (HOSPITAL_COMMUNITY): Payer: Self-pay | Admitting: Pharmacy Technician

## 2014-03-13 ENCOUNTER — Ambulatory Visit
Admission: RE | Admit: 2014-03-13 | Discharge: 2014-03-13 | Disposition: A | Discharge status: Home / Self Care | Payer: Managed Care, Other (non HMO) | Source: Ambulatory Visit | Attending: Cardiovascular Disease | Admitting: Cardiovascular Disease

## 2014-03-13 ENCOUNTER — Other Ambulatory Visit: Payer: Self-pay | Admitting: *Deleted

## 2014-03-13 DIAGNOSIS — Z01818 Encounter for other preprocedural examination: Secondary | ICD-10-CM

## 2014-03-13 DIAGNOSIS — R079 Chest pain, unspecified: Secondary | ICD-10-CM

## 2014-03-13 LAB — TSH: TSH: 1.511 u[IU]/mL (ref 0.350–4.500)

## 2014-03-13 LAB — LIPID PANEL
CHOL/HDL RATIO: 5.5 ratio
CHOLESTEROL: 225 mg/dL — AB (ref 0–200)
HDL: 41 mg/dL (ref >39)
LDL Cholesterol: 162 mg/dL — ABNORMAL HIGH (ref 0–99)
Triglycerides: 112 mg/dL (ref <150)
VLDL: 22 mg/dL (ref 0–40)

## 2014-03-13 LAB — CBC
HEMATOCRIT: 40.6 % (ref 39.0–52.0)
HEMOGLOBIN: 14.1 g/dL (ref 13.0–17.0)
MCH: 30.4 pg (ref 26.0–34.0)
MCHC: 34.7 g/dL (ref 30.0–36.0)
MCV: 87.5 fL (ref 78.0–100.0)
Platelets: 252 10*3/uL (ref 150–400)
RBC: 4.64 MIL/uL (ref 4.22–5.81)
RDW: 13.4 % (ref 11.5–15.5)
WBC: 5.8 10*3/uL (ref 4.0–10.5)

## 2014-03-13 LAB — COMPREHENSIVE METABOLIC PANEL
ALK PHOS: 44 U/L (ref 39–117)
ALT: 16 U/L (ref 0–53)
AST: 16 U/L (ref 0–37)
Albumin: 3.9 g/dL (ref 3.5–5.2)
BILIRUBIN TOTAL: 0.3 mg/dL (ref 0.2–1.2)
BUN: 25 mg/dL — AB (ref 6–23)
CO2: 26 meq/L (ref 19–32)
CREATININE: 1.36 mg/dL — AB (ref 0.50–1.35)
Calcium: 9.4 mg/dL (ref 8.4–10.5)
Chloride: 104 mEq/L (ref 96–112)
GLUCOSE: 92 mg/dL (ref 70–99)
Potassium: 4.6 mEq/L (ref 3.5–5.3)
SODIUM: 139 meq/L (ref 135–145)
TOTAL PROTEIN: 6.9 g/dL (ref 6.0–8.3)

## 2014-03-13 LAB — PROTIME-INR
INR: 1.12 (ref <1.50)
Prothrombin Time: 14.3 seconds (ref 11.6–15.2)

## 2014-03-13 LAB — APTT: APTT: 29 s (ref 24–37)

## 2014-03-14 ENCOUNTER — Encounter (HOSPITAL_COMMUNITY)
Admission: RE | Disposition: A | Discharge status: Home / Self Care | Payer: Managed Care, Other (non HMO) | Source: Ambulatory Visit | Attending: Surgery

## 2014-03-14 ENCOUNTER — Inpatient Hospital Stay (HOSPITAL_COMMUNITY)
Admission: RE | Admit: 2014-03-14 | Discharge: 2014-03-24 | DRG: 234 | Disposition: A | Discharge status: Home / Self Care | Payer: Managed Care, Other (non HMO) | Source: Ambulatory Visit | Attending: Surgery | Admitting: Surgery

## 2014-03-14 ENCOUNTER — Other Ambulatory Visit: Payer: Self-pay | Admitting: *Deleted

## 2014-03-14 DIAGNOSIS — Z01818 Encounter for other preprocedural examination: Secondary | ICD-10-CM

## 2014-03-14 DIAGNOSIS — I208 Other forms of angina pectoris: Secondary | ICD-10-CM | POA: Diagnosis present

## 2014-03-14 DIAGNOSIS — I10 Essential (primary) hypertension: Secondary | ICD-10-CM | POA: Diagnosis present

## 2014-03-14 DIAGNOSIS — Z87891 Personal history of nicotine dependence: Secondary | ICD-10-CM

## 2014-03-14 DIAGNOSIS — Y832 Surgical operation with anastomosis, bypass or graft as the cause of abnormal reaction of the patient, or of later complication, without mention of misadventure at the time of the procedure: Secondary | ICD-10-CM | POA: Diagnosis not present

## 2014-03-14 DIAGNOSIS — K5900 Constipation, unspecified: Secondary | ICD-10-CM | POA: Diagnosis not present

## 2014-03-14 DIAGNOSIS — Z8249 Family history of ischemic heart disease and other diseases of the circulatory system: Secondary | ICD-10-CM

## 2014-03-14 DIAGNOSIS — Z823 Family history of stroke: Secondary | ICD-10-CM

## 2014-03-14 DIAGNOSIS — Z951 Presence of aortocoronary bypass graft: Secondary | ICD-10-CM

## 2014-03-14 DIAGNOSIS — I2582 Chronic total occlusion of coronary artery: Secondary | ICD-10-CM | POA: Diagnosis present

## 2014-03-14 DIAGNOSIS — F411 Generalized anxiety disorder: Secondary | ICD-10-CM | POA: Diagnosis present

## 2014-03-14 DIAGNOSIS — E669 Obesity, unspecified: Secondary | ICD-10-CM | POA: Diagnosis present

## 2014-03-14 DIAGNOSIS — Z881 Allergy status to other antibiotic agents status: Secondary | ICD-10-CM

## 2014-03-14 DIAGNOSIS — D62 Acute posthemorrhagic anemia: Secondary | ICD-10-CM | POA: Diagnosis not present

## 2014-03-14 DIAGNOSIS — Z833 Family history of diabetes mellitus: Secondary | ICD-10-CM

## 2014-03-14 DIAGNOSIS — I2510 Atherosclerotic heart disease of native coronary artery without angina pectoris: Principal | ICD-10-CM | POA: Diagnosis present

## 2014-03-14 DIAGNOSIS — J95811 Postprocedural pneumothorax: Secondary | ICD-10-CM | POA: Diagnosis not present

## 2014-03-14 DIAGNOSIS — Z7982 Long term (current) use of aspirin: Secondary | ICD-10-CM

## 2014-03-14 DIAGNOSIS — E8779 Other fluid overload: Secondary | ICD-10-CM | POA: Diagnosis not present

## 2014-03-14 DIAGNOSIS — R9439 Abnormal result of other cardiovascular function study: Secondary | ICD-10-CM

## 2014-03-14 DIAGNOSIS — I200 Unstable angina: Secondary | ICD-10-CM | POA: Diagnosis present

## 2014-03-14 DIAGNOSIS — E785 Hyperlipidemia, unspecified: Secondary | ICD-10-CM | POA: Diagnosis present

## 2014-03-14 DIAGNOSIS — Z6831 Body mass index (BMI) 31.0-31.9, adult: Secondary | ICD-10-CM

## 2014-03-14 DIAGNOSIS — R012 Other cardiac sounds: Secondary | ICD-10-CM

## 2014-03-14 DIAGNOSIS — J9382 Other air leak: Secondary | ICD-10-CM | POA: Diagnosis not present

## 2014-03-14 HISTORY — PX: LEFT HEART CATHETERIZATION WITH CORONARY ANGIOGRAM: SHX5451

## 2014-03-14 SURGERY — LEFT HEART CATHETERIZATION WITH CORONARY ANGIOGRAM
Anesthesia: LOCAL

## 2014-03-14 MED ORDER — FENTANYL CITRATE 0.05 MG/ML IJ SOLN
INTRAMUSCULAR | Status: AC
  Filled 2014-03-14: qty 2

## 2014-03-14 MED ORDER — AMLODIPINE BESYLATE 5 MG PO TABS
5.0000 mg | ORAL_TABLET | Freq: Every day | ORAL | Status: DC
  Administered 2014-03-15 – 2014-03-17 (×3): 5 mg via ORAL
  Filled 2014-03-14 (×4): qty 1

## 2014-03-14 MED ORDER — LIDOCAINE HCL (PF) 1 % IJ SOLN
INTRAMUSCULAR | Status: AC
  Filled 2014-03-14: qty 30

## 2014-03-14 MED ORDER — SODIUM CHLORIDE 0.9 % IJ SOLN: 3.0000 mL | Freq: Two times a day (BID) | INTRAMUSCULAR | Status: DC

## 2014-03-14 MED ORDER — DIAZEPAM 5 MG PO TABS
5.0000 mg | ORAL_TABLET | ORAL | Status: AC
  Administered 2014-03-14: 5 mg via ORAL
  Filled 2014-03-14: qty 1

## 2014-03-14 MED ORDER — GEMFIBROZIL 600 MG PO TABS
600.0000 mg | ORAL_TABLET | Freq: Two times a day (BID) | ORAL | Status: DC
  Filled 2014-03-14 (×2): qty 1

## 2014-03-14 MED ORDER — METOPROLOL TARTRATE 25 MG PO TABS
25.0000 mg | ORAL_TABLET | Freq: Three times a day (TID) | ORAL | Status: DC
  Administered 2014-03-14 – 2014-03-16 (×8): 25 mg via ORAL
  Filled 2014-03-14 (×11): qty 1

## 2014-03-14 MED ORDER — ASPIRIN 81 MG PO CHEW: 81.0000 mg | CHEWABLE_TABLET | ORAL | Status: DC

## 2014-03-14 MED ORDER — BUPROPION HCL ER (XL) 300 MG PO TB24
300.0000 mg | ORAL_TABLET | Freq: Every day | ORAL | Status: DC
  Administered 2014-03-15 – 2014-03-24 (×9): 300 mg via ORAL
  Filled 2014-03-14 (×10): qty 1

## 2014-03-14 MED ORDER — ASPIRIN EC 81 MG PO TBEC
81.0000 mg | DELAYED_RELEASE_TABLET | Freq: Every day | ORAL | Status: DC
  Administered 2014-03-14 – 2014-03-17 (×4): 81 mg via ORAL
  Filled 2014-03-14 (×5): qty 1

## 2014-03-14 MED ORDER — SODIUM CHLORIDE 0.9 % IJ SOLN: 3.0000 mL | INTRAMUSCULAR | Status: DC | PRN

## 2014-03-14 MED ORDER — LISINOPRIL 20 MG PO TABS
20.0000 mg | ORAL_TABLET | Freq: Every day | ORAL | Status: DC
  Administered 2014-03-15 – 2014-03-17 (×3): 20 mg via ORAL
  Filled 2014-03-14 (×4): qty 1

## 2014-03-14 MED ORDER — NITROGLYCERIN 0.2 MG/ML ON CALL CATH LAB
INTRAVENOUS | Status: AC
  Filled 2014-03-14: qty 1

## 2014-03-14 MED ORDER — VERAPAMIL HCL 2.5 MG/ML IV SOLN
INTRAVENOUS | Status: AC
  Filled 2014-03-14: qty 2

## 2014-03-14 MED ORDER — MIDAZOLAM HCL 2 MG/2ML IJ SOLN
INTRAMUSCULAR | Status: AC
  Filled 2014-03-14: qty 2

## 2014-03-14 MED ORDER — DEXTROSE-NACL 5-0.45 % IV SOLN: INTRAVENOUS | Status: DC

## 2014-03-14 MED ORDER — HEPARIN SODIUM (PORCINE) 1000 UNIT/ML IJ SOLN
INTRAMUSCULAR | Status: AC
  Filled 2014-03-14: qty 1

## 2014-03-14 MED ORDER — NITROGLYCERIN 0.4 MG/HR TD PT24: 0.4000 mg | MEDICATED_PATCH | Freq: Every day | TRANSDERMAL | Status: DC

## 2014-03-14 MED ORDER — SODIUM CHLORIDE 0.9 % IV SOLN
INTRAVENOUS | Status: DC
  Administered 2014-03-14: 13:00:00 via INTRAVENOUS

## 2014-03-14 MED ORDER — HEPARIN (PORCINE) IN NACL 2-0.9 UNIT/ML-% IJ SOLN
INTRAMUSCULAR | Status: AC
  Filled 2014-03-14: qty 1000

## 2014-03-14 MED ORDER — ISOSORBIDE MONONITRATE ER 60 MG PO TB24
60.0000 mg | ORAL_TABLET | ORAL | Status: DC
  Administered 2014-03-14: 60 mg via ORAL
  Filled 2014-03-14 (×2): qty 1

## 2014-03-14 MED ORDER — NITROGLYCERIN 0.4 MG/HR TD PT24
0.4000 mg | MEDICATED_PATCH | Freq: Every day | TRANSDERMAL | Status: DC
  Filled 2014-03-14: qty 1

## 2014-03-14 MED ORDER — NITROGLYCERIN 0.4 MG SL SUBL
0.4000 mg | SUBLINGUAL_TABLET | SUBLINGUAL | Status: DC | PRN
  Administered 2014-03-14 – 2014-03-15 (×2): 0.4 mg via SUBLINGUAL
  Filled 2014-03-14 (×2): qty 1

## 2014-03-14 MED ORDER — AMOXICILLIN 500 MG PO CAPS
500.0000 mg | ORAL_CAPSULE | Freq: Three times a day (TID) | ORAL | Status: DC
  Administered 2014-03-14 – 2014-03-15 (×3): 500 mg via ORAL
  Filled 2014-03-14 (×6): qty 1

## 2014-03-14 MED ORDER — ASPIRIN 81 MG PO TABS: 81.0000 mg | ORAL_TABLET | Freq: Every day | ORAL | Status: DC

## 2014-03-14 MED ORDER — ATORVASTATIN CALCIUM 80 MG PO TABS
80.0000 mg | ORAL_TABLET | Freq: Every day | ORAL | Status: DC
  Administered 2014-03-14 – 2014-03-23 (×9): 80 mg via ORAL
  Filled 2014-03-14 (×11): qty 1

## 2014-03-14 MED ORDER — RANOLAZINE ER 500 MG PO TB12
1000.0000 mg | ORAL_TABLET | Freq: Two times a day (BID) | ORAL | Status: DC
  Administered 2014-03-14 – 2014-03-17 (×7): 1000 mg via ORAL
  Filled 2014-03-14 (×9): qty 2

## 2014-03-14 MED ORDER — EZETIMIBE 10 MG PO TABS
10.0000 mg | ORAL_TABLET | ORAL | Status: DC
  Administered 2014-03-14 – 2014-03-19 (×5): 10 mg via ORAL
  Filled 2014-03-14 (×7): qty 1

## 2014-03-14 MED ORDER — MORPHINE SULFATE 2 MG/ML IJ SOLN
2.0000 mg | Freq: Once | INTRAMUSCULAR | Status: AC
  Administered 2014-03-14: 2 mg via INTRAVENOUS
  Filled 2014-03-14: qty 1

## 2014-03-14 MED ORDER — ZOLPIDEM TARTRATE 5 MG PO TABS
10.0000 mg | ORAL_TABLET | Freq: Every evening | ORAL | Status: DC | PRN
  Administered 2014-03-14 – 2014-03-16 (×3): 10 mg via ORAL
  Filled 2014-03-14 (×4): qty 2

## 2014-03-14 MED ORDER — METOPROLOL TARTRATE 25 MG PO TABS
25.0000 mg | ORAL_TABLET | Freq: Two times a day (BID) | ORAL | Status: DC
  Filled 2014-03-14: qty 1

## 2014-03-14 MED ORDER — SODIUM CHLORIDE 0.9 % IV SOLN: 250.0000 mL | INTRAVENOUS | Status: DC | PRN

## 2014-03-14 NOTE — Progress Notes (Signed)
TOOK 81MG  ASA AT 0800

## 2014-03-14 NOTE — CV Procedure (Signed)
Kenneth [name] is a 48 y.o. male    [id number]  [id number] LOCATION:  FACILITY: [facility]  PHYSICIAN: Troy Sine, MD, [facility] 05-13-1966   DATE OF PROCEDURE:  03/14/2014    CARDIAC CATHETERIZATION     HISTORY:    Kenneth [name] is a 48 year old white male who has a history of hypertension, hyper- lipidemia, and strong family history for premature coronary disease. Approximately 6 years ago, he was diagnosed as having microvascular angina while living in [place] and apparently had a catheterization which reportedly demonstrated normal epicardial coronary arteries. He has recently developed increasing episodes of chest tightness on medical therapy. He underwent a nuclear perfusion study which demonstrated marked ischemia in the inferior, lateral to lateral inferior region. Definitive cardiac catheterization was recommended.   PROCEDURE:  The patient was brought to the second floor [facility] cath lab in the postabsorptive state. He was premedicated with Versed 2 mg and fentanyl 50 mcg. A right radial approach was utilized for the procedure after the patient was found to have a positive Allen's test. A 5 French Glidesheath Slender was inserted without difficulty into the right radial artery after the right radial artery was punctured via the Seldinger technique. Radial cocktail consisting of verapamil, nitroglycerin, and lidocaine was administered. Weight-based heparin 5000 units was administered intravenously after the safety J. Wire and JR 4 catheter were danced into the central aorta. Selective angiography was done with a JR were in JL 3.5 diagnostic catheters. A 5 French pigtail catheter was used for left ventriculography. The catheters were removed from the patient. The TR band was applied at 1222 at 12 cc of air for hemostasis. The patient tolerated the procedure well.  HEMODYNAMICS:   Central Aorta: 100/63   Left Ventricle: 100/19  ANGIOGRAPHY:  The left main coronary  artery was angiographically normal and trifurcated into an LAD, a ramus intermediate vessel, and left circumflex coronary artery.  The LAD gave rise to a proximal septal perforating artery and then had a 60-70% stenosis beyond a septal vessel. There was an additional 50% mid LAD stenosis. There was significant septal collaterals supplying the distal RCA.  The Ramus  Intermediate vessel was angiographically normal.  The left circumflex cardiotomy ice to the proximal marginal vessel. This marginal vessel had 95% stenosis at its origin. It was subtotally stenosed in the distal aspect. The circumflex vessel was totally occluded almost immediately beyond the takeoff of the small marginal vessel. This appeared to be a chronic total occlusion with a long occluded segment. However, there was filling of a distal marginal and distal circumflex via collaterals.  The right coronary artery appeared to have a prior diffuse new subtotal stenosis proximally and there was extensive bridging collateralization and small vessels. There also was an aneurysmal segment with bridging collaterals seen to enter. The distal RCA was totally occluded after the acute margin branch. There were extensive left to right collaterals supplying the distal PDA and PLA vessel.  Left ventriculography revealed an ejection fraction of approximately 50%. There was mild anterolateral hypocontractility   IMPRESSION:  Low normal to mild LV dysfunction with an ejection fraction of 50% with subtle mild anterolateral hypocontractility.  Significant multivessel coronary obstructive disease with 60-70% LAD stenosis after the first septal perforating artery and 50% mid LAD stenosis, probable chronic occlusion of the left circumflex vessel with collateralization to the distal marginal branch and 95% stenosis in the first marginal branch with 99% stenosis in its distal segment; and probable previous subtotal stenosis of the  proximal RCA with bridging  collaterals and evidence for a total distal RCA stenosis with evidence for left to right collaterals.   RECOMMENDATION:  Surgical consultation for CABG revascularization surgery will be recommended.  Troy Sine, MD, [facility] 03/14/2014 7:01 PM

## 2014-03-14 NOTE — Progress Notes (Signed)
Appreciate Dr. Vivi Martens evaluation. Pt stable post cath. I have dc ntg patch and started Imdur 60 mg, increased lopressor to 25 tid and dc'c lopid and added zetia to atorvastatin. Tentative surgical date is Tuesday. He can poosible be dc'd tomorrow if surgical pre-op studies can be done and re-admit on Tuesday for CABG if ok with TTS. Troy Sine, MD 03/14/2014 8:37 PM

## 2014-03-14 NOTE — H&P (View-Only) (Signed)
Patient ID: [name], male   DOB: May 19, 1966, 48 y.o.   MRN: [id number]     HPI: [name] is a 48 y.o. male who is seen today as an add-on after he underwent an exercise Myoview study which was felt to be high risk.  [name] is a 48 year old male who is originally from [name]. He states in 2001 he first developed episodes of intermittent chest discomfort. Over several years he underwent several evaluations both cardiac as well as GI. He underwent a cardiac catheterization at least 5 years previously and was told of having normal epicardial coronary arteries. He was diagnosed with microvascular angina. He has been treated with nitroglycerin with improvement. He also completed in [facility] treatment regimen. Since moving to the [name] area, he had seen [name] by his report several years ago and was maintained on medical regimen. Patient has continued to experience exertional chest pain that seems to have increased over the past year. There was a period when he had missed taking some of his medications. He saw [name] on 01/28/2014. At that time he describes accelerating chest pain pattern. He was referred for an exercise Myoview study which he had today in our office.  On his exercise stress test, he exercised for 10 minutes and 57 seconds achieved a 12.5 METs workload. He developed chest pain with stress and had a hypertensive diastolic blood pressure response to 140/110. He did not develop significant ST changes of ischemia. However, scintigraphic images were markedly abnormal and a significant stress-induced large in size and severe in intensity defect was present commencing in the distal inferolateral wall extending basally to involve the basal septal inferior inferior extending to the lateral inferior segment. The extent of defect was 26%. There was possible mild basal nontransmural inferior scar. Because of this scan I am now seeing him in the office for followup  evaluation.  Past Medical History  Diagnosis Date  . Syndrome X, cardiac   . Hypertension   . Hyperlipidemia   . History of Doppler ultrasound 07/09/2013    carotid doppler; moderate narrowing of both subclavian arteries, with normal carotid arteries    Past Surgical History  Procedure Laterality Date  . Kidney stone surgery  age 19  . Shoulder arthroscopy  2001    L shoulder  . Cardiac catheterization  2005    Allergies  Allergen Reactions  . Tetracyclines & Related Nausea And Vomiting    Current Outpatient Prescriptions  Medication Sig Dispense Refill  . amLODipine (NORVASC) 5 MG tablet Take 5 mg by mouth daily.      Marland Kitchen aspirin 81 MG tablet Take 81 mg by mouth 2 (two) times daily.      Marland Kitchen atorvastatin (LIPITOR) 80 MG tablet TAKE 1 TABLET AT BEDTIME  90 tablet  3  . gemfibrozil (LOPID) 600 MG tablet TAKE 1 TABLET BY MOUTH TWICE A DAY  60 tablet  6  . lisinopril (PRINIVIL,ZESTRIL) 20 MG tablet Take 1 tablet (20 mg total) by mouth daily.  90 tablet  6  . nitroGLYCERIN (NITRODUR - DOSED IN MG/24 HR) 0.4 mg/hr patch APPLY 2 PATCHES TO SKIN EVERY DAY  60 patch  4  . nitroGLYCERIN (NITROSTAT) 0.4 MG SL tablet Place 1 tablet (0.4 mg total) under the tongue every 5 (five) minutes as needed for chest pain.  100 tablet  2  . ranolazine (RANEXA) 1000 MG SR tablet Take 1 tablet (1,000 mg total) by mouth 2 (two) times daily.  56 tablet  0  . TESTOSTERONE IM Inject 1.5 mLs into the muscle every 14 (fourteen) days.      . zolpidem (AMBIEN) 10 MG tablet Take 1 tablet (10 mg total) by mouth at bedtime as needed for sleep.  30 tablet  5  . metoprolol tartrate (LOPRESSOR) 25 MG tablet Take 1 tablet (25 mg total) by mouth 2 (two) times daily.  60 tablet  6   No current facility-administered medications for this visit.    History   Social History  . Marital Status: Single    Spouse Name: N/A    Number of Children: N/A  . Years of Education: N/A   Occupational History  . Not on file.    Social History Main Topics  . Smoking status: Former Smoker    Types: Cigars    Quit date: 06/26/2012  . Smokeless tobacco: Never Used  . Alcohol Use: No  . Drug Use: No  . Sexual Activity: Not on file   Other Topics Concern  . Not on file   Social History Narrative  . No narrative on file   Social history is notable that he is in his third marriage. He has 3 children of his own. He quit tobacco in 2013. 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   Family History  Problem Relation Age of Onset  . Heart attack Father   . Cancer Father     lung  . Heart attack Mother   . Stroke Mother   . Diabetes Mother     ROS is negative for fevers, chills or night sweats. He denies skin rash. He gets to weight gain over the years. He denies change in vision or hearing. He is unaware of lymphadenopathy. He does note exertional shortness of breath and exertional chest tightness. He denies presyncope or syncope. He denies cough or increased sputum production. He denies wheezing. There is no history of indigestion. He denies nausea vomiting or diarrhea. He is unaware of blood in stool or urine. He denies claudication. He does have hyperlipidemia and a significant history of elevated triglycerides in excess of 400 in the past. He denies edema. He is unaware of sleep disturbed breathing. He is unaware of diabetes or hypothyroidism.  Other comprehensive 14 point system review is negative.  PE BP 131/84  Pulse 84  Ht 5' 9" (1.753 m)  Wt 219 lb 3.2 oz (99.428 kg)  BMI 32.36 kg/m2  General: Alert, oriented, no distress.  Skin: normal turgor, no rashes HEENT: Normocephalic, atraumatic. Pupils round and reactive; sclera anicteric;no lid lag. Extraocular muscles intact;; no xanthelasmas. Nose without nasal septal hypertrophy Mouth/Parynx benign; Mallinpatti scale 2 Neck: No JVD, no carotid bruits; normal carotid upstroke Lungs: clear to ausculatation and  percussion; no wheezing or rales Chest wall: no tenderness to palpitation Heart: RRR, s1 s2 normal; faint 1/6 systolic murmur;no diastolic murmur, rub thrills or heaves Abdomen: soft, nontender; no hepatosplenomehaly, BS+; abdominal aorta nontender and not dilated by palpation. Back: no CVA tenderness Pulses 2+ Extremities: no clubbing cyanosis or edema, Homan's sign negative  Neurologic: grossly nonfocal; cranial nerves grossly normal. Psychologic: normal affect and mood.   LABS:  BMET    Component Value Date/Time   NA 138 01/28/2014 0930   K 4.3 01/28/2014 0930   CL 102 01/28/2014 0930   CO2 26 01/28/2014 0930   GLUCOSE 98 01/28/2014 0930   BUN 23 01/28/2014 0930   CREATININE 1.13 01/28/2014 0930     CREATININE 1.13 09/27/2011 1735   CALCIUM 9.7 01/28/2014 0930   GFRNONAA 76* 09/27/2011 1735   GFRAA 88* 09/27/2011 1735     Hepatic Function Panel     Component Value Date/Time   PROT 7.6 01/28/2014 0930   ALBUMIN 4.3 01/28/2014 0930   AST 17 01/28/2014 0930   ALT 12 01/28/2014 0930   ALKPHOS 45 01/28/2014 0930   BILITOT 0.5 01/28/2014 0930     CBC    Component Value Date/Time   WBC 5.8 01/28/2014 0930   RBC 4.79 01/28/2014 0930   HGB 14.7 01/28/2014 0930   HCT 41.9 01/28/2014 0930   PLT 288 01/28/2014 0930   MCV 87.5 01/28/2014 0930   MCH 30.7 01/28/2014 0930   MCHC 35.1 01/28/2014 0930   RDW 13.9 01/28/2014 0930     BNP No results found for this basename: probnp    Lipid Panel     Component Value Date/Time   TRIG 102 01/28/2014 0930   LDLCALC 256* 01/28/2014 0930     RADIOLOGY: No results found.    ASSESSMENT AND PLAN: [name] is a 48 year old male who has a history of chest pain over at least 14 years. Prior workup in [name] apparently did not demonstrate any significant epicardial coronary obstructive disease and he was felt to have microvascular angina. Recently, he has noticed a definite change with the development of increasing exertional chest discomfort. He has been on a  medical regimen consisting of amlodipine 5 mg, ACE inhibition with lisinopril 20 mg, Ranexa 1000 mg twice a day, in addition to two 0.4 mg per hour nitrate  patches. It is does not wear the patch his chest pain symptoms are significantly more frequent and intense. I spent considerable time with him today and reviewed his nuclear perfusion stress test and exercise data. He did experience exercise induced chest pain without definitive ST-T abnormalities on the exercise electrocardiogram. However, scintigraphic images demonstrate significant ischemia highly suggestive of left circumflex or RCA involvement. Presently, I have added low-dose beta blockade with metoprolol tartrate to initiate at 25 mg twice a day. I discussed with him at length definitive cardiac catheterization with need for possible percutaneous coronary intervention. Due to his high risk nuclear perfusion study, I am scheduling this test to be done on this Friday, 03/14/2014. Tomorrow admission laboratory will be obtained. I discussed with him the potential need for percutaneous intervention if high grade obstructive disease is identified. He is aware of the risks benefits of the procedure and wishes to proceed with this definitive evaluation.     Troy Sine, MD, [facility]  03/12/2014 7:14 PM

## 2014-03-14 NOTE — Progress Notes (Signed)
UR Completed Amai Cappiello Graves-Bigelow, RN,BSN [phone]  

## 2014-03-14 NOTE — Consult Note (Signed)
[address]       [name] [address]             [phone]       Reason for Consult: Severe multi-vessel coronary artery disease with progressive angina Referring Physician:  Dr. Shelva Majestic  [name] is an 48 y.o. male.  HPI:   The patient is a 48 year old gentleman with hypertension and hyperlipidemia and a strong family history of premature coronary disease who reports a history of intermittent chest discomfort since 2001 while living in [place]. He underwent several evaluations both cardiac as well as GI. He underwent a cardiac catheterization at least 5 years previously in [place] and was told of having normal epicardial coronary arteries. He was diagnosed with microvascular angina. He has been treated with nitroglycerin with improvement. He also completed in E[facility]P treatment regimen. Since moving to the [place] area, he had seen Dr.Hilty by his report several years ago and was maintained on medical regimen. He has continued to experience exertional chest pain that seems to have increased over the past year. He has been having chest pain with minimal exertion as well as after eating.  He saw Dr. Claiborne Billings on 01/28/2014. At that time he described accelerating chest pain pattern. He was referred for an exercise Myoview study that was high risk for inferior and lateral ischemia with chest pain. Cath today shows severe multi-vessel disease with occlusion of the LCX and RCA with high grade proximal LAD stenosis.  There are collat to the distal RCA and OM. LV function is good.   Past Medical History  Diagnosis Date  . Syndrome X, cardiac   . Hypertension   . Hyperlipidemia   . History of Doppler ultrasound 07/09/2013    carotid doppler; moderate narrowing of both subclavian arteries, with normal carotid arteries    Past Surgical History  Procedure Laterality Date  . Kidney stone surgery  age 58  . Shoulder arthroscopy  2001    L shoulder  . Cardiac  catheterization  2005    Family History  Problem Relation Age of Onset  . Heart attack Father   . Cancer Father     lung  . Heart attack Mother   . Stroke Mother   . Diabetes Mother     Social History:  reports that he quit smoking about 20 months ago. His smoking use included Cigars. He has never used smokeless tobacco. He reports that he does not drink alcohol or use illicit drugs.  Allergies:  Allergies  Allergen Reactions  . Tetracyclines & Related Nausea And Vomiting    Medications:  I have reviewed the patient's current medications. Prior to Admission:  Prescriptions prior to admission  Medication Sig Dispense Refill  . amLODipine (NORVASC) 5 MG tablet Take 5 mg by mouth daily.      Marland Kitchen amoxicillin (AMOXIL) 500 MG capsule Take 500 mg by mouth every 8 (eight) hours. Started 02/26/14.  14 day course of therapy.  (Pre-medication for dental work)      . aspirin 81 MG tablet Take 81 mg by mouth daily.       Marland Kitchen atorvastatin (LIPITOR) 80 MG tablet Take 80 mg by mouth at bedtime.      Marland Kitchen buPROPion (WELLBUTRIN XL) 300 MG 24 hr tablet Take 300 mg by mouth daily.      Marland Kitchen gemfibrozil (LOPID) 600 MG tablet Take 600 mg by mouth 2 (two) times daily before a meal.      .  lisinopril (PRINIVIL,ZESTRIL) 20 MG tablet Take 1 tablet (20 mg total) by mouth daily.  90 tablet  6  . metoprolol tartrate (LOPRESSOR) 25 MG tablet Take 1 tablet (25 mg total) by mouth 2 (two) times daily.  60 tablet  6  . nitroGLYCERIN (NITRODUR - DOSED IN MG/24 HR) 0.4 mg/hr patch Place 0.4-0.8 mg onto the skin daily. Patient uses 1 patch every day.  Increases to 2 patches when exercising.      . nitroGLYCERIN (NITROSTAT) 0.4 MG SL tablet Place 0.4 mg under the tongue every 5 (five) minutes as needed for chest pain.      . ranolazine (RANEXA) 1000 MG SR tablet Take 1 tablet (1,000 mg total) by mouth 2 (two) times daily.  56 tablet  0  . TESTOSTERONE IM Inject 1.5 mLs into the muscle every 7 (seven) days.       Marland Kitchen zolpidem  (AMBIEN) 10 MG tablet Take 10 mg by mouth at bedtime as needed for sleep.       Scheduled: . [START ON 03/15/2014] amLODipine  5 mg Oral Daily  . amoxicillin  500 mg Oral 3 times per day  . aspirin EC  81 mg Oral Daily  . atorvastatin  80 mg Oral QHS  . [START ON 03/15/2014] buPROPion  300 mg Oral Daily  . gemfibrozil  600 mg Oral BID AC  . [START ON 03/15/2014] lisinopril  20 mg Oral Daily  . metoprolol tartrate  25 mg Oral BID  . nitroGLYCERIN  0.4 mg Transdermal Daily  . ranolazine  1,000 mg Oral BID   Continuous: . sodium chloride 150 mL/hr at 03/14/14 1251   [id number], zolpidem  Results for orders placed in visit on 03/12/14 (from the past 48 hour(s))  CBC     Status: None   Collection Time    03/13/14 11:00 AM      Result Value Ref Range   WBC 5.8  4.0 - 10.5 K/uL   RBC 4.64  4.22 - 5.81 MIL/uL   Hemoglobin 14.1  13.0 - 17.0 g/dL   HCT 40.6  39.0 - 52.0 %   MCV 87.5  78.0 - 100.0 fL   MCH 30.4  26.0 - 34.0 pg   MCHC 34.7  30.0 - 36.0 g/dL   RDW 13.4  11.5 - 15.5 %   Platelets 252  150 - 400 K/uL  PROTIME-INR     Status: None   Collection Time    03/13/14 11:00 AM      Result Value Ref Range   Prothrombin Time 14.3  11.6 - 15.2 seconds   INR 1.12  <1.50   Comment: The INR is of principal utility in following patients on stable doses     of oral anticoagulants.  The therapeutic range is generally 2.0 to     3.0, but may be 3.0 to 4.0 in patients with mechanical cardiac valves,     recurrent embolisms and antiphospholipid antibodies (including lupus     inhibitors).  LIPID PANEL     Status: Abnormal   Collection Time    03/13/14 11:00 AM      Result Value Ref Range   Cholesterol 225 (*) 0 - 200 mg/dL   Comment: ATP III Classification:           < 200        mg/dL        Desirable          200 - 239  mg/dL        Borderline High          >= 240        mg/dL        High         Triglycerides 112  <150 mg/dL   HDL 41  >39 mg/dL   Total CHOL/HDL Ratio  5.5     VLDL 22  0 - 40 mg/dL   LDL Cholesterol 162 (*) 0 - 99 mg/dL   Comment:       Total Cholesterol/HDL Ratio:CHD Risk                            Coronary Heart Disease Risk Table                                            Men       Women              1/2 Average Risk              3.4        3.3                  Average Risk              5.0        4.4               2X Average Risk              9.6        7.1               3X Average Risk             23.4       11.0     Use the calculated Patient Ratio above and the CHD Risk table      to determine the patient's CHD Risk.     ATP III Classification (LDL):           < 100        mg/dL         Optimal          100 - 129     mg/dL         Near or Above Optimal          130 - 159     mg/dL         Borderline High          160 - 189     mg/dL         High           > 190        mg/dL         Very High        TSH     Status: None   Collection Time    03/13/14 11:00 AM      Result Value Ref Range   TSH 1.511  0.350 - 4.500 uIU/mL  COMPREHENSIVE METABOLIC PANEL     Status: Abnormal   Collection Time    03/13/14 11:00 AM      Result Value Ref Range   Sodium 139  135 - 145 mEq/L   Potassium 4.6  3.5 - 5.3 mEq/L  Chloride 104  96 - 112 mEq/L   CO2 26  19 - 32 mEq/L   Glucose, Bld 92  70 - 99 mg/dL   BUN 25 (*) 6 - 23 mg/dL   Creat 1.36 (*) 0.50 - 1.35 mg/dL   Total Bilirubin 0.3  0.2 - 1.2 mg/dL   Alkaline Phosphatase 44  39 - 117 U/L   AST 16  0 - 37 U/L   ALT 16  0 - 53 U/L   Total Protein 6.9  6.0 - 8.3 g/dL   Albumin 3.9  3.5 - 5.2 g/dL   Calcium 9.4  8.4 - 10.5 mg/dL  APTT     Status: None   Collection Time    03/13/14 11:00 AM      Result Value Ref Range   aPTT 29  24 - 37 seconds   Comment: This test is for screening purposes only; it should not be used for     therapeutic unfractionated heparin monitoring.  Please refer to     Heparin Anti-Xa ([id number]).    Dg Chest 2 View  03/13/2014   CLINICAL DATA:  Chest pain,  pre cardiac catheterization  EXAM: CHEST  2 VIEW  COMPARISON:  None.  FINDINGS: No active infiltrate or effusion is seen. Mediastinal contours and the heart are within normal limits. No bony abnormality is seen.  IMPRESSION: No active cardiopulmonary disease.   Electronically Signed   By: Ivar Drape M.D.   On: 03/13/2014 11:39    Review of Systems  Constitutional: Positive for malaise/fatigue. Negative for fever, chills and weight loss.       Has gained weight due to inactivity.  HENT: Negative.   Eyes: Negative.   Respiratory: Positive for shortness of breath. Negative for cough, hemoptysis and wheezing.   Cardiovascular: Positive for chest pain. Negative for palpitations, orthopnea, leg swelling and PND.  Gastrointestinal: Negative.   Genitourinary: Negative.   Musculoskeletal: Negative.   Skin: Negative.   Neurological: Negative.   Endo/Heme/Allergies: Negative.   Psychiatric/Behavioral: Negative.    Blood pressure 111/68, pulse 56, temperature 98 F (36.7 C), temperature source Oral, resp. rate 18, height 5\' 9"  (1.753 m), weight 99.338 kg (219 lb), SpO2 94.00%. Physical Exam  Constitutional: He is oriented to person, place, and time. He appears well-developed and well-nourished. No distress.  HENT:  Head: Normocephalic and atraumatic.  Mouth/Throat: Oropharynx is clear and moist.  Eyes: EOM are normal. Pupils are equal, round, and reactive to light.  Neck: Normal range of motion. Neck supple. No JVD present. No thyromegaly present.  Cardiovascular: Normal rate, regular rhythm, normal heart sounds and intact distal pulses.   No murmur heard. Respiratory: Effort normal and breath sounds normal. No respiratory distress. He has no rales.  GI: Soft. Bowel sounds are normal. He exhibits no distension and no mass. There is no tenderness.  Musculoskeletal: Normal range of motion. He exhibits no edema and no tenderness.  Lymphadenopathy:    He has no cervical adenopathy.  Neurological:  He is alert and oriented to person, place, and time. He has normal strength. No cranial nerve deficit or sensory deficit.  Skin: Skin is warm and dry.  Psychiatric: He has a normal mood and affect.    Assessment/Plan:  He has severe multi-vessel coronary disease with progressive life style limiting angina. I agree that CABG is the best treatment to improve his quality of life and prevent further ischemia and infarction. I discussed the operative procedure with the patient and his  wife including alternatives, benefits and risks; including but not limited to bleeding, blood transfusion, infection, stroke, myocardial infarction, graft failure, heart block requiring a permanent pacemaker, organ dysfunction, and death.  [name] understands and agrees to proceed.  We will schedule surgery for next Tuesday morning.  Cleaven Demario K 03/14/2014, 2:25 PM

## 2014-03-14 NOTE — Interval H&P Note (Signed)
Cath Lab Visit (complete for each Cath Lab visit)  Clinical Evaluation Leading to the Procedure:   ACS: no  Non-ACS:    Anginal Classification: CCS III  Anti-ischemic medical therapy: Maximal Therapy (2 or more classes of medications)  Non-Invasive Test Results: High-risk stress test findings: cardiac mortality >3%/year  Prior CABG: No previous CABG      History and Physical Interval Note:  03/14/2014 11:26 AM  [name]  has presented today for surgery, with the diagnosis of Abnormal Nuclear Stress Test  The various methods of treatment have been discussed with the patient and family. After consideration of risks, benefits and other options for treatment, the patient has consented to  Procedure(s): LEFT HEART CATHETERIZATION WITH CORONARY ANGIOGRAM (N/A) as a surgical intervention .  The patient's history has been reviewed, patient examined, no change in status, stable for surgery.  I have reviewed the patient's chart and labs.  Questions were answered to the patient's satisfaction.     [name] A

## 2014-03-15 ENCOUNTER — Encounter (HOSPITAL_COMMUNITY): Payer: Self-pay

## 2014-03-15 ENCOUNTER — Other Ambulatory Visit: Payer: Self-pay

## 2014-03-15 DIAGNOSIS — I2510 Atherosclerotic heart disease of native coronary artery without angina pectoris: Secondary | ICD-10-CM | POA: Diagnosis present

## 2014-03-15 DIAGNOSIS — Z01810 Encounter for preprocedural cardiovascular examination: Secondary | ICD-10-CM

## 2014-03-15 LAB — HEPARIN LEVEL (UNFRACTIONATED): Heparin Unfractionated: 0.32 IU/mL (ref 0.30–0.70)

## 2014-03-15 MED ORDER — OXYCODONE-ACETAMINOPHEN 5-325 MG PO TABS: 1.0000 | ORAL_TABLET | Freq: Once | ORAL | Status: AC | PRN

## 2014-03-15 MED ORDER — NITROGLYCERIN IN D5W 200-5 MCG/ML-% IV SOLN
2.0000 ug/min | INTRAVENOUS | Status: DC
  Administered 2014-03-15: 5 ug/min via INTRAVENOUS

## 2014-03-15 MED ORDER — LORAZEPAM 1 MG PO TABS
1.0000 mg | ORAL_TABLET | Freq: Three times a day (TID) | ORAL | Status: DC | PRN
  Administered 2014-03-15 – 2014-03-17 (×4): 1 mg via ORAL
  Filled 2014-03-15 (×2): qty 1
  Filled 2014-03-15: qty 2
  Filled 2014-03-15: qty 1

## 2014-03-15 MED ORDER — ALPRAZOLAM 0.5 MG PO TABS: 0.5000 mg | ORAL_TABLET | Freq: Two times a day (BID) | ORAL | Status: DC | PRN

## 2014-03-15 MED ORDER — ISOSORBIDE MONONITRATE ER 60 MG PO TB24: 60.0000 mg | ORAL_TABLET | ORAL | Status: DC

## 2014-03-15 MED ORDER — HEPARIN (PORCINE) IN NACL 100-0.45 UNIT/ML-% IJ SOLN
1300.0000 [IU]/h | INTRAMUSCULAR | Status: DC
  Administered 2014-03-15: 1300 [IU]/h via INTRAVENOUS
  Filled 2014-03-15 (×5): qty 250

## 2014-03-15 MED ORDER — ALPRAZOLAM 0.25 MG PO TABS
0.2500 mg | ORAL_TABLET | Freq: Two times a day (BID) | ORAL | Status: DC | PRN
  Administered 2014-03-15: 0.25 mg via ORAL
  Filled 2014-03-15: qty 1

## 2014-03-15 MED ORDER — ISOSORBIDE MONONITRATE ER 60 MG PO TB24
60.0000 mg | ORAL_TABLET | ORAL | Status: DC
  Administered 2014-03-15 – 2014-03-17 (×3): 60 mg via ORAL
  Filled 2014-03-15 (×4): qty 1

## 2014-03-15 MED ORDER — ALPRAZOLAM 0.25 MG PO TABS
0.2500 mg | ORAL_TABLET | Freq: Once | ORAL | Status: AC
  Administered 2014-03-15: 0.25 mg via ORAL
  Filled 2014-03-15: qty 1

## 2014-03-15 MED ORDER — NITROGLYCERIN 0.4 MG/HR TD PT24
0.4000 mg | MEDICATED_PATCH | Freq: Every day | TRANSDERMAL | Status: DC
  Filled 2014-03-15: qty 1

## 2014-03-15 MED ORDER — NITROGLYCERIN IN D5W 200-5 MCG/ML-% IV SOLN
INTRAVENOUS | Status: AC
  Administered 2014-03-15: 5 ug/min via INTRAVENOUS
  Filled 2014-03-15: qty 250

## 2014-03-15 NOTE — Progress Notes (Signed)
Called by nurse to reassess amoxil order. Started 3/4 for 14-day course premedication for dental work, can now be discontinued. [employer] PA-C

## 2014-03-15 NOTE — Progress Notes (Signed)
Pt c/o of midsternal chest pain X3, pain 4/10, non radiating, "some" shortness of breath. No diaphoresis. 1NTG tablet given with relief to 2/10 Dr. Radford Pax made aware and ordered 2mg  of morphine, pt had relief to 1/10 Pt woke up again with chest pain at 0130, Dr Radford Pax paged and ordered NTG gtt. Pt is now chest pain free. VSS

## 2014-03-15 NOTE — Progress Notes (Signed)
ANTICOAGULATION CONSULT NOTE - Initial Consult  Pharmacy Consult for heparin Indication: chest pain/ACS  Allergies  Allergen Reactions  . Tetracyclines & Related Nausea And Vomiting    Patient Measurements: Height: 5\' 9"  (175.3 cm) Weight: 219 lb (99.338 kg) IBW/kg (Calculated) : 70.7 Heparin Dosing Weight: 91kg  Vital Signs: Temp: 97.9 F (36.6 C) (03/21 0428) Temp src: Oral (03/21 0428) BP: 116/74 mmHg (03/21 0926) Pulse Rate: 58 (03/21 0926)  Labs:  Recent Labs  03/13/14 1100  HGB 14.1  HCT 40.6  PLT 252  APTT 29  LABPROT 14.3  INR 1.12  CREATININE 1.36*    Estimated Creatinine Clearance: 76.3 ml/min (by C-G formula based on Cr of 1.36).   Medical History: Past Medical History  Diagnosis Date  . Syndrome X, cardiac   . Hypertension   . Hyperlipidemia   . History of Doppler ultrasound 07/09/2013    carotid doppler; moderate narrowing of both subclavian arteries, with normal carotid arteries    Medications:  Prescriptions prior to admission  Medication Sig Dispense Refill  . amLODipine (NORVASC) 5 MG tablet Take 5 mg by mouth daily.      Marland Kitchen amoxicillin (AMOXIL) 500 MG capsule Take 500 mg by mouth every 8 (eight) hours. Started 02/26/14.  14 day course of therapy.  (Pre-medication for dental work)      . aspirin 81 MG tablet Take 81 mg by mouth daily.       Marland Kitchen atorvastatin (LIPITOR) 80 MG tablet Take 80 mg by mouth at bedtime.      Marland Kitchen buPROPion (WELLBUTRIN XL) 300 MG 24 hr tablet Take 300 mg by mouth daily.      Marland Kitchen gemfibrozil (LOPID) 600 MG tablet Take 600 mg by mouth 2 (two) times daily before a meal.      . lisinopril (PRINIVIL,ZESTRIL) 20 MG tablet Take 1 tablet (20 mg total) by mouth daily.  90 tablet  6  . metoprolol tartrate (LOPRESSOR) 25 MG tablet Take 1 tablet (25 mg total) by mouth 2 (two) times daily.  60 tablet  6  . nitroGLYCERIN (NITRODUR - DOSED IN MG/24 HR) 0.4 mg/hr patch Place 0.4-0.8 mg onto the skin daily. Patient uses 1 patch every day.   Increases to 2 patches when exercising.      . nitroGLYCERIN (NITROSTAT) 0.4 MG SL tablet Place 0.4 mg under the tongue every 5 (five) minutes as needed for chest pain.      . ranolazine (RANEXA) 1000 MG SR tablet Take 1 tablet (1,000 mg total) by mouth 2 (two) times daily.  56 tablet  0  . TESTOSTERONE IM Inject 1.5 mLs into the muscle every 7 (seven) days.       Marland Kitchen zolpidem (AMBIEN) 10 MG tablet Take 10 mg by mouth at bedtime as needed for sleep.       Scheduled:  . amLODipine  5 mg Oral Daily  . amoxicillin  500 mg Oral 3 times per day  . aspirin EC  81 mg Oral Daily  . atorvastatin  80 mg Oral QHS  . buPROPion  300 mg Oral Daily  . ezetimibe  10 mg Oral Q24H  . isosorbide mononitrate  60 mg Oral Q24H  . lisinopril  20 mg Oral Daily  . metoprolol tartrate  25 mg Oral TID  . ranolazine  1,000 mg Oral BID    Assessment: 48 yo male s/p cath (03/14/14) with 3V CAD noted for CABG on 3/24 to begin heparin until CABG.  Goal of Therapy:  Heparin level 0.3-0.7 units/ml Monitor platelets by anticoagulation protocol: Yes   Plan:  -No heparin bolus due to recent cath -Begin heparin at 1300 units/hr -Heparin level in 6 hours and daily wth CBC daily  Hildred Laser, Pharm D 03/15/2014 11:32 AM

## 2014-03-15 NOTE — Progress Notes (Signed)
Cardiac Rehab Phase I  Reviewed pre-op education with pt.  Discussed importance of ambulation and not using arms post-op.  Gave pt information sheet and encouraged him to watch pre-op video before Tues.   Alberteen Sam, MA, ACSM RCEP

## 2014-03-15 NOTE — Progress Notes (Signed)
Subjective:  Chest pain at rest occurring last night requiring intravenous nitroglycerin. He had some additional rest chest discomfort this morning. He states that he has pain at rest at home that sometimes will occur after he takes his nitroglycerin patch off. He was upset that his medicines have been changed but was having recurrent pain this morning. His surgery is scheduled for Tuesday.  Objective:  Vital Signs in the last 24 hours: BP 116/74  Pulse 58  Temp(Src) 97.9 F (36.6 C) (Oral)  Resp 18  Ht 5\' 9"  (1.753 m)  Wt 99.338 kg (219 lb)  BMI 32.33 kg/m2  SpO2 96%  Physical Exam: Somewhat anxious white male in no acute distress Lungs:  Clear Cardiac:  Regular rhythm, normal S1 and S2, no S3 Extremities:  Radial cath site stable  Intake/Output from previous day: 03/20 0701 - 03/21 0700 In: 600 [P.O.:600] Out: -   Weight Filed Weights   03/14/14 0933  Weight: 99.338 kg (219 lb)    Lab Results: Basic Metabolic Panel:  Recent Labs  03/13/14 1100  NA 139  K 4.6  CL 104  CO2 26  GLUCOSE 92  BUN 25*  CREATININE 1.36*   CBC:  Recent Labs  03/13/14 1100  WBC 5.8  HGB 14.1  HCT 40.6  MCV 87.5  PLT 252   Telemetry: Sinus rhythm  Assessment/Plan:  1. Unstable angina with rest angina 2. Severe three-vessel coronary artery disease  Recommendations:  Long discussion with patient and wife concerning rest angina. He is upset that his patch was discontinued. He continues to have angina on her venous nitroglycerin and recommended he stay in the hospital and be placed on intravenous anticoagulation as well as titration of nitroglycerin for recurrent pain. He after a long discussion was agreeable to stay.  Kerry Hough  MD [facility]  03/15/2014, 10:55 AM

## 2014-03-15 NOTE — Progress Notes (Signed)
Pt in chest pain. Scheduled Imdur given see MAR. Vitals stable. EKG obtained and seen by oncall cardiologist. New order received. Advised to not increase Ntg gtt with PO Imdur. Pt informed and requesting SL ntg. Pt states, " that's what helped me last night". MD paged and said it was ok to give if SBP>100. Will continue to monitor.

## 2014-03-15 NOTE — Progress Notes (Addendum)
Pt became extremely anxious about staying and was considering leaving AMA. Dr. Wynonia Lawman had long discussion with him about this this morning. I had a discussion remotely over the phone with the patient to discuss his thoughts - he is understandably quite anxious about being in the hospital and having to undergo heart surgery. I explained why we would like to keep him in the hospital (continues to have intermittent rest pain). He is on Wellbutrin chronically which for some patients can ramp up their anxiety further in stressful situations. Xanax was ineffective this morning. Offered the patient to try a different anxiolytic - Ativan - and he is amenable. I told him we are not trying to sedate him completely, but do have options to help him be less nervous. I reaffirmed that the nervousness he is feeling is completely normal. Will try giving Ativan and told him to let us know if there is anything else we can do to make his stay less stressful/anxious. He appreciates the care that the team has given him including his nurse.   Kenneth Hreha PA-C  Addendum: this morning he was upset that his NTG paste had been changed to Imdur. D/w Dr. Wynonia Lawman this morning to confirm that it was OK to continue Imdur while on low dose NTG drip since he was stable on this. We initially offered to change him back to his NTG paste but he later declined and said he would like to continue the Imdur because he's felt OK today since getting it last night.   Kenneth Cypress PA-C

## 2014-03-15 NOTE — Progress Notes (Signed)
[place] for heparin Indication: CAD  Allergies  Allergen Reactions  . Tetracyclines & Related Nausea And Vomiting    Patient Measurements: Height: 5\' 9"  (175.3 cm) Weight: 219 lb (99.338 kg) IBW/kg (Calculated) : 70.7 Heparin Dosing Weight: 91kg  Vital Signs: Temp: 97.5 F (36.4 C) (03/21 2030) Temp src: Oral (03/21 1324) BP: 135/70 mmHg (03/21 2157) Pulse Rate: 59 (03/21 2157)  Labs:  Recent Labs  03/13/14 1100 03/15/14 2205  HGB 14.1  --   HCT 40.6  --   PLT 252  --   APTT 29  --   LABPROT 14.3  --   INR 1.12  --   HEPARINUNFRC  --  0.32  CREATININE 1.36*  --     Estimated Creatinine Clearance: 76.3 ml/min (by C-G formula based on Cr of 1.36).  Assessment: 48 yo male with 3V CAD s/p cath, awaiting CABG 3/24, for heparin  Goal of Therapy:  Heparin level 0.3-0.7 units/ml Monitor platelets by anticoagulation protocol: Yes   Plan:  Continue Heparin at current rate Follow-up am labs.   Phillis Knack, PharmD, BCPS    03/15/2014 11:25 PM

## 2014-03-15 NOTE — Progress Notes (Addendum)
Pre-op Cardiac Surgery  Carotid Findings:  1-39% ICA stenosis.  Vertebral artery flow is antegrade.  Upper Extremity Right Left  Brachial Pressures 112T 115T  Radial Waveforms T T  Ulnar Waveforms T T  Palmar Arch (Allen's Test) WNL WNL   Findings:      Lower  Extremity Right Left  Dorsalis Pedis    Anterior Tibial    Posterior Tibial    Ankle/Brachial Indices      Findings:

## 2014-03-15 NOTE — Progress Notes (Signed)
Utilization Review Completed.Nobie Alleyne T3/21/2015  

## 2014-03-16 LAB — BASIC METABOLIC PANEL
BUN: 23 mg/dL (ref 6–23)
CALCIUM: 9.5 mg/dL (ref 8.4–10.5)
CO2: 23 meq/L (ref 19–32)
CREATININE: 1.05 mg/dL (ref 0.50–1.35)
Chloride: 102 mEq/L (ref 96–112)
GFR calc non Af Amer: 82 mL/min — ABNORMAL LOW (ref >90)
Glucose, Bld: 99 mg/dL (ref 70–99)
Potassium: 4.5 mEq/L (ref 3.7–5.3)
Sodium: 138 mEq/L (ref 137–147)

## 2014-03-16 LAB — CBC
HEMATOCRIT: 40.7 % (ref 39.0–52.0)
Hemoglobin: 14.2 g/dL (ref 13.0–17.0)
MCH: 30.7 pg (ref 26.0–34.0)
MCHC: 34.9 g/dL (ref 30.0–36.0)
MCV: 87.9 fL (ref 78.0–100.0)
PLATELETS: 215 10*3/uL (ref 150–400)
RBC: 4.63 MIL/uL (ref 4.22–5.81)
RDW: 12.5 % (ref 11.5–15.5)
WBC: 6.8 10*3/uL (ref 4.0–10.5)

## 2014-03-16 LAB — HEPARIN LEVEL (UNFRACTIONATED): Heparin Unfractionated: 0.48 IU/mL (ref 0.30–0.70)

## 2014-03-16 NOTE — Progress Notes (Signed)
[place] for heparin Indication: CAD  Allergies  Allergen Reactions  . Tetracyclines & Related Nausea And Vomiting    Patient Measurements: Height: 5\' 9"  (175.3 cm) Weight: 219 lb (99.338 kg) IBW/kg (Calculated) : 70.7 Heparin Dosing Weight: 91kg  Vital Signs: Temp: 97.5 F (36.4 C) (03/22 0500) BP: 99/60 mmHg (03/22 0500) Pulse Rate: 50 (03/22 0500)  Labs:  Recent Labs  03/13/14 1100 03/15/14 2205 03/16/14 0340  HGB 14.1  --  14.2  HCT 40.6  --  40.7  PLT 252  --  215  APTT 29  --   --   LABPROT 14.3  --   --   INR 1.12  --   --   HEPARINUNFRC  --  0.32 0.48  CREATININE 1.36*  --   --     Estimated Creatinine Clearance: 76.3 ml/min (by C-G formula based on Cr of 1.36).  Assessment: 48 yo male with 3V CAD s/p cath, awaiting CABG 3/24 on heparin and noted at goal (HL= 0.48)  Goal of Therapy:  Heparin level 0.3-0.7 units/ml Monitor platelets by anticoagulation protocol: Yes   Plan:  -Continue Heparin at current rate -Follow-up am labs.   Hildred Laser, Pharm D 03/16/2014 9:55 AM

## 2014-03-16 NOTE — Progress Notes (Signed)
Subjective:   he was seen a couple of times by the physician's assistant yesterday for reassurance. One episode of chest pain at rest relieved with nitroglycerin sublingual last night. He appears to be willing to stay at the present time. No rest pain today.  Objective:  Vital Signs in the last 24 hours: BP 99/60  Pulse 50  Temp(Src) 97.5 F (36.4 C) (Oral)  Resp 18  Ht 5\' 9"  (1.753 m)  Wt 99.338 kg (219 lb)  BMI 32.33 kg/m2  SpO2 98%  Physical Exam: Pleasant white male in no acute distress Lungs:  Clear Cardiac:  Regular rhythm, normal S1 and S2, no S3 Extremities:  Radial cath site stable  Intake/Output from previous day: 03/21 0701 - 03/22 0700 In: 360 [P.O.:360] Out: -   Weight Filed Weights   03/14/14 0933  Weight: 99.338 kg (219 lb)   CBC:  Recent Labs  03/16/14 0340  WBC 6.8  HGB 14.2  HCT 40.7  MCV 87.9  PLT 215   Telemetry: Sinus rhythm  Assessment/Plan:  1. Unstable angina with rest angina 2. Severe three-vessel coronary artery disease  Recommendations:  Pain appears to settle down on intravenous nitroglycerin and heparin. CABG planned for Tuesday.  Kerry Hough  MD [facility]  03/16/2014, 11:22 AM

## 2014-03-17 ENCOUNTER — Encounter: Payer: Self-pay | Admitting: Cardiovascular Disease

## 2014-03-17 ENCOUNTER — Inpatient Hospital Stay (HOSPITAL_COMMUNITY): Payer: Managed Care, Other (non HMO)

## 2014-03-17 DIAGNOSIS — I200 Unstable angina: Secondary | ICD-10-CM

## 2014-03-17 LAB — PULMONARY FUNCTION TEST
DL/VA % pred: 126 %
DL/VA: 5.77 ml/min/mmHg/L
DLCO cor % pred: 130 %
DLCO cor: 40.45 ml/min/mmHg
DLCO unc % pred: 127 %
DLCO unc: 39.51 ml/min/mmHg
FEF 25-75 Post: 4.08 L/sec
FEF 25-75 Pre: 2.6 L/sec
FEF2575-%CHANGE-POST: 57 %
FEF2575-%Pred-Post: 119 %
FEF2575-%Pred-Pre: 76 %
FEV1-%Change-Post: 12 %
FEV1-%PRED-PRE: 85 %
FEV1-%Pred-Post: 96 %
FEV1-POST: 3.67 L
FEV1-Pre: 3.26 L
FEV1FVC-%CHANGE-POST: 4 %
FEV1FVC-%Pred-Pre: 96 %
FEV6-%Change-Post: 8 %
FEV6-%Pred-Post: 97 %
FEV6-%Pred-Pre: 90 %
FEV6-PRE: 4.27 L
FEV6-Post: 4.64 L
FEV6FVC-%Change-Post: 0 %
FEV6FVC-%Pred-Post: 103 %
FEV6FVC-%Pred-Pre: 102 %
FVC-%Change-Post: 7 %
FVC-%Pred-Post: 94 %
FVC-%Pred-Pre: 87 %
FVC-PRE: 4.31 L
FVC-Post: 4.64 L
POST FEV1/FVC RATIO: 79 %
POST FEV6/FVC RATIO: 100 %
Pre FEV1/FVC ratio: 76 %
Pre FEV6/FVC Ratio: 99 %
RV % pred: 120 %
RV: 2.38 L
TLC % PRED: 102 %
TLC: 6.9 L

## 2014-03-17 LAB — BLOOD GAS, ARTERIAL
Acid-Base Excess: 1 mmol/L (ref 0.0–2.0)
BICARBONATE: 25.4 meq/L — AB (ref 20.0–24.0)
Drawn by: 347641
FIO2: 0.21 %
O2 Saturation: 95.7 %
PCO2 ART: 42.3 mmHg (ref 35.0–45.0)
PH ART: 7.395 (ref 7.350–7.450)
PO2 ART: 78.6 mmHg — AB (ref 80.0–100.0)
Patient temperature: 98.6
TCO2: 26.6 mmol/L (ref 0–100)

## 2014-03-17 LAB — URINALYSIS, ROUTINE W REFLEX MICROSCOPIC
Bilirubin Urine: NEGATIVE
Glucose, UA: NEGATIVE mg/dL
Hgb urine dipstick: NEGATIVE
Ketones, ur: NEGATIVE mg/dL
LEUKOCYTES UA: NEGATIVE
NITRITE: NEGATIVE
PROTEIN: 30 mg/dL — AB
Specific Gravity, Urine: 1.024 (ref 1.005–1.030)
Urobilinogen, UA: 0.2 mg/dL (ref 0.0–1.0)
pH: 5 (ref 5.0–8.0)

## 2014-03-17 LAB — URINE MICROSCOPIC-ADD ON

## 2014-03-17 LAB — CBC
HEMATOCRIT: 39.3 % (ref 39.0–52.0)
Hemoglobin: 13.8 g/dL (ref 13.0–17.0)
MCH: 31 pg (ref 26.0–34.0)
MCHC: 35.1 g/dL (ref 30.0–36.0)
MCV: 88.3 fL (ref 78.0–100.0)
Platelets: 208 10*3/uL (ref 150–400)
RBC: 4.45 MIL/uL (ref 4.22–5.81)
RDW: 12.5 % (ref 11.5–15.5)
WBC: 7.9 10*3/uL (ref 4.0–10.5)

## 2014-03-17 LAB — TYPE AND SCREEN
ABO/RH(D): A POS
ANTIBODY SCREEN: NEGATIVE

## 2014-03-17 LAB — HEPARIN LEVEL (UNFRACTIONATED): Heparin Unfractionated: 0.46 IU/mL (ref 0.30–0.70)

## 2014-03-17 MED ORDER — BISACODYL 5 MG PO TBEC
5.0000 mg | DELAYED_RELEASE_TABLET | Freq: Once | ORAL | Status: AC
  Administered 2014-03-17: 5 mg via ORAL
  Filled 2014-03-17: qty 1

## 2014-03-17 MED ORDER — PHENYLEPHRINE HCL 10 MG/ML IJ SOLN
30.0000 ug/min | INTRAMUSCULAR | Status: DC
  Filled 2014-03-17: qty 2

## 2014-03-17 MED ORDER — ALBUTEROL SULFATE (2.5 MG/3ML) 0.083% IN NEBU
2.5000 mg | INHALATION_SOLUTION | Freq: Once | RESPIRATORY_TRACT | Status: AC
  Administered 2014-03-17: 2.5 mg via RESPIRATORY_TRACT

## 2014-03-17 MED ORDER — CHLORHEXIDINE GLUCONATE 4 % EX LIQD
CUTANEOUS | Status: AC
  Administered 2014-03-17: 23:00:00
  Filled 2014-03-17: qty 30

## 2014-03-17 MED ORDER — DOPAMINE-DEXTROSE 3.2-5 MG/ML-% IV SOLN
2.0000 ug/kg/min | INTRAVENOUS | Status: DC
  Filled 2014-03-17: qty 250

## 2014-03-17 MED ORDER — VANCOMYCIN HCL 10 G IV SOLR
1250.0000 mg | INTRAVENOUS | Status: DC
  Administered 2014-03-18: 1250 mg via INTRAVENOUS
  Filled 2014-03-17: qty 1250

## 2014-03-17 MED ORDER — DIAZEPAM 5 MG PO TABS
5.0000 mg | ORAL_TABLET | ORAL | Status: DC | PRN
  Administered 2014-03-17 – 2014-03-20 (×3): 5 mg via ORAL
  Filled 2014-03-17 (×3): qty 1

## 2014-03-17 MED ORDER — METOPROLOL TARTRATE 25 MG PO TABS
25.0000 mg | ORAL_TABLET | Freq: Two times a day (BID) | ORAL | Status: DC
  Administered 2014-03-17 (×2): 25 mg via ORAL
  Filled 2014-03-17 (×4): qty 1

## 2014-03-17 MED ORDER — DEXMEDETOMIDINE HCL IN NACL 400 MCG/100ML IV SOLN
0.1000 ug/kg/h | INTRAVENOUS | Status: DC
  Administered 2014-03-18: 0.2 ug/kg/h via INTRAVENOUS
  Filled 2014-03-17: qty 100

## 2014-03-17 MED ORDER — EPINEPHRINE HCL 1 MG/ML IJ SOLN
0.5000 ug/min | INTRAVENOUS | Status: DC
  Filled 2014-03-17: qty 4

## 2014-03-17 MED ORDER — PLASMA-LYTE 148 IV SOLN
INTRAVENOUS | Status: DC
  Filled 2014-03-17: qty 2.5

## 2014-03-17 MED ORDER — SODIUM CHLORIDE 0.9 % IV SOLN
INTRAVENOUS | Status: DC
  Administered 2014-03-18: 1 [IU]/h via INTRAVENOUS
  Filled 2014-03-17: qty 1

## 2014-03-17 MED ORDER — NITROGLYCERIN IN D5W 200-5 MCG/ML-% IV SOLN
2.0000 ug/min | INTRAVENOUS | Status: DC
  Administered 2014-03-18 (×2): 10 ug/min via INTRAVENOUS
  Filled 2014-03-17: qty 250

## 2014-03-17 MED ORDER — METOPROLOL TARTRATE 12.5 MG HALF TABLET
12.5000 mg | ORAL_TABLET | Freq: Once | ORAL | Status: DC
  Filled 2014-03-17: qty 1

## 2014-03-17 MED ORDER — DIAZEPAM 5 MG PO TABS
10.0000 mg | ORAL_TABLET | Freq: Once | ORAL | Status: AC
  Administered 2014-03-18: 10 mg via ORAL
  Filled 2014-03-17: qty 2

## 2014-03-17 MED ORDER — POTASSIUM CHLORIDE 2 MEQ/ML IV SOLN
80.0000 meq | INTRAVENOUS | Status: DC
  Filled 2014-03-17: qty 40

## 2014-03-17 MED ORDER — CHLORHEXIDINE GLUCONATE CLOTH 2 % EX PADS
6.0000 | MEDICATED_PAD | Freq: Once | CUTANEOUS | Status: AC
  Administered 2014-03-17: 6 via TOPICAL

## 2014-03-17 MED ORDER — DEXTROSE 5 % IV SOLN
750.0000 mg | INTRAVENOUS | Status: DC
  Filled 2014-03-17 (×2): qty 750

## 2014-03-17 MED ORDER — CHLORHEXIDINE GLUCONATE CLOTH 2 % EX PADS
6.0000 | MEDICATED_PAD | Freq: Once | CUTANEOUS | Status: AC
  Administered 2014-03-18: 6 via TOPICAL

## 2014-03-17 MED ORDER — HEPARIN SODIUM (PORCINE) 1000 UNIT/ML IJ SOLN
INTRAMUSCULAR | Status: DC
Start: 2014-03-18 — End: 2014-03-18
  Filled 2014-03-17: qty 30

## 2014-03-17 MED ORDER — DEXTROSE 5 % IV SOLN
1.5000 g | INTRAVENOUS | Status: DC
  Administered 2014-03-18: 1.5 g via INTRAVENOUS
  Administered 2014-03-18: .75 g via INTRAVENOUS
  Filled 2014-03-17 (×2): qty 1.5

## 2014-03-17 MED ORDER — MAGNESIUM SULFATE 50 % IJ SOLN
40.0000 meq | INTRAMUSCULAR | Status: DC
  Filled 2014-03-17: qty 10

## 2014-03-17 MED ORDER — TEMAZEPAM 15 MG PO CAPS: 15.0000 mg | ORAL_CAPSULE | Freq: Once | ORAL | Status: AC | PRN

## 2014-03-17 MED ORDER — SODIUM CHLORIDE 0.9 % IV SOLN
INTRAVENOUS | Status: DC
  Administered 2014-03-18: 69.8 mL/h via INTRAVENOUS
  Filled 2014-03-17: qty 40

## 2014-03-17 NOTE — Progress Notes (Signed)
3 Days Post-Op Procedure(s) (LRB): LEFT HEART CATHETERIZATION WITH CORONARY ANGIOGRAM (N/A) Subjective: Doing well today. Had some chest pain Saturday responding to SL NTG, no chest pain yesterday.  Objective: Vital signs in last 24 hours: Temp:  [97.6 F (36.4 C)-98.3 F (36.8 C)] 97.6 F (36.4 C) (03/23 0546) Pulse Rate:  [64-68] 68 (03/23 0546) Cardiac Rhythm:  [-] Sinus bradycardia (03/23 0745) Resp:  [18] 18 (03/23 0546) BP: (98-122)/(51-66) 98/51 mmHg (03/23 0546) SpO2:  [98 %-100 %] 98 % (03/23 0546)  Hemodynamic parameters for last 24 hours:    Intake/Output from previous day: 03/22 0701 - 03/23 0700 In: 926.8 [P.O.:360; I.V.:566.8] Out: -  Intake/Output this shift:    General appearance: alert and cooperative Heart: regular rate and rhythm, S1, S2 normal, no murmur, click, rub or gallop Lungs: clear to auscultation bilaterally  Lab Results:  Recent Labs  03/16/14 0340 03/17/14 0635  WBC 6.8 7.9  HGB 14.2 13.8  HCT 40.7 39.3  PLT 215 208   BMET:  Recent Labs  03/16/14 1125  NA 138  K 4.5  CL 102  CO2 23  GLUCOSE 99  BUN 23  CREATININE 1.05  CALCIUM 9.5    PT/INR: No results found for this basename: LABPROT, INR,  in the last 72 hours ABG No results found for this basename: phart, pco2, po2, hco3, tco2, acidbasedef, o2sat   CBG (last 3)  No results found for this basename: GLUCAP,  in the last 72 hours  Assessment/Plan: S/P Procedure(s) (LRB): LEFT HEART CATHETERIZATION WITH CORONARY ANGIOGRAM (N/A) Severe multi-vessel coronary disease  Plan CABG in the am. I discussed the operative procedure with the patient including alternatives, benefits and risks; including but not limited to bleeding, blood transfusion, infection, stroke, myocardial infarction, graft failure, heart block requiring a permanent pacemaker, organ dysfunction, and death.  Kenneth [name] understands and agrees to proceed.     LOS: 3 days    Gilford Raid  K 03/17/2014

## 2014-03-17 NOTE — Progress Notes (Signed)
    Subjective:  No chest pain overnight  Objective:  Vital Signs in the last 24 hours: Temp:  [97.6 F (36.4 C)-98.3 F (36.8 C)] 97.6 F (36.4 C) (03/23 0546) Pulse Rate:  [56-68] 68 (03/23 0546) Resp:  [18-19] 18 (03/23 0546) BP: (98-122)/(51-67) 98/51 mmHg (03/23 0546) SpO2:  [98 %-100 %] 98 % (03/23 0546)  Intake/Output from previous day:  Intake/Output Summary (Last 24 hours) at 03/17/14 0655 Last data filed at 03/16/14 0900  Gross per 24 hour  Intake    360 ml  Output      0 ml  Net    360 ml    Physical Exam: General appearance: alert, cooperative and no distress Lungs: clear to auscultation bilaterally Heart: regular rate and rhythm   Rate: 42-55  Rhythm: normal sinus rhythm and sinus bradycardia  Lab Results:  Recent Labs  03/16/14 0340  WBC 6.8  HGB 14.2  PLT 215    Recent Labs  03/16/14 1125  NA 138  K 4.5  CL 102  CO2 23  GLUCOSE 99  BUN 23  CREATININE 1.05   No results found for this basename: TROPONINI, CK, MB,  in the last 72 hours No results found for this basename: INR,  in the last 72 hours  Imaging: Imaging results have been reviewed  Cardiac Studies:  Assessment/Plan:   Principal Problem:   Unstable angina Active Problems:   Abnormal nuclear stress test   Coronary artery disease-3 V CAD at cath   HTN (hypertension)   Syndrome X, cardiac- prior cath '05, 2010   Dyslipidemia   Obesity (BMI 30-39.9)    PLAN: I reduced his Metoprolol to 25 mg BID, (HR down to 42 overnight). CABG in am.   Kerin Ransom PA-C [facility] [phone] 03/17/2014, 6:55 AM   Patient seen, examined. Available data reviewed. Agree with findings, assessment, and plan as outlined by Kerin Ransom, PA-C. Exam reveals an alert, oriented male in no acute distress. His lungs are clear. Heart is regular rate and rhythm without murmur or gallop. There is no peripheral edema. The patient has been chest pain-free overnight. Telemetry demonstrated episodes of  bradycardia, and his beta blocker has been reduced. Plans noted for CABG tomorrow morning. The patient has had resting chest pain requiring IV heparin and nitroglycerin. He is now stable and awaiting cardiac surgery. All of his questions were answered this morning. We'll continue same therapies with plans for surgery tomorrow.  Sherren Mocha, M.D. 03/17/2014 7:36 AM

## 2014-03-18 ENCOUNTER — Inpatient Hospital Stay (HOSPITAL_COMMUNITY): Payer: Managed Care, Other (non HMO)

## 2014-03-18 ENCOUNTER — Inpatient Hospital Stay (HOSPITAL_COMMUNITY): Payer: Managed Care, Other (non HMO) | Admitting: Certified Registered Nurse Anesthetist

## 2014-03-18 ENCOUNTER — Encounter (HOSPITAL_COMMUNITY): Payer: Self-pay | Admitting: Certified Registered Nurse Anesthetist

## 2014-03-18 ENCOUNTER — Encounter (HOSPITAL_COMMUNITY): Payer: Managed Care, Other (non HMO) | Admitting: Certified Registered Nurse Anesthetist

## 2014-03-18 ENCOUNTER — Encounter (HOSPITAL_COMMUNITY)
Admission: RE | Disposition: A | Discharge status: Home / Self Care | Payer: Managed Care, Other (non HMO) | Source: Ambulatory Visit | Attending: Surgery

## 2014-03-18 DIAGNOSIS — Z951 Presence of aortocoronary bypass graft: Secondary | ICD-10-CM

## 2014-03-18 HISTORY — PX: CORONARY ARTERY BYPASS GRAFT: SHX141

## 2014-03-18 HISTORY — PX: INTRAOPERATIVE TRANSESOPHAGEAL ECHOCARDIOGRAM: SHX5062

## 2014-03-18 LAB — POCT I-STAT 3, ART BLOOD GAS (G3+)
Acid-Base Excess: 3 mmol/L — ABNORMAL HIGH (ref 0.0–2.0)
Acid-Base Excess: 3 mmol/L — ABNORMAL HIGH (ref 0.0–2.0)
Acid-Base Excess: 4 mmol/L — ABNORMAL HIGH (ref 0.0–2.0)
Acid-base deficit: 1 mmol/L (ref 0.0–2.0)
BICARBONATE: 28.7 meq/L — AB (ref 20.0–24.0)
BICARBONATE: 28 meq/L — AB (ref 20.0–24.0)
BICARBONATE: 24.7 meq/L — AB (ref 20.0–24.0)
Bicarbonate: 23.8 mEq/L (ref 20.0–24.0)
Bicarbonate: 26.3 mEq/L — ABNORMAL HIGH (ref 20.0–24.0)
Bicarbonate: 30.2 mEq/L — ABNORMAL HIGH (ref 20.0–24.0)
O2 SAT: 100 %
O2 Saturation: 100 %
O2 Saturation: 100 %
O2 Saturation: 97 %
O2 Saturation: 96 %
O2 Saturation: 94 %
PCO2 ART: 37.9 mmHg (ref 35.0–45.0)
PH ART: 7.407 (ref 7.350–7.450)
PH ART: 7.4 (ref 7.350–7.450)
PH ART: 7.362 (ref 7.350–7.450)
PH ART: 7.357 (ref 7.350–7.450)
PO2 ART: 524 mmHg — AB (ref 80.0–100.0)
PO2 ART: 170 mmHg — AB (ref 80.0–100.0)
PO2 ART: 86 mmHg (ref 80.0–100.0)
Patient temperature: 35.9
Patient temperature: 37.1
TCO2: 30 mmol/L (ref 0–100)
TCO2: 29 mmol/L (ref 0–100)
TCO2: 26 mmol/L (ref 0–100)
TCO2: 25 mmol/L (ref 0–100)
TCO2: 28 mmol/L (ref 0–100)
TCO2: 32 mmol/L (ref 0–100)
pCO2 arterial: 48.4 mmHg — ABNORMAL HIGH (ref 35.0–45.0)
pCO2 arterial: 44.4 mmHg (ref 35.0–45.0)
pCO2 arterial: 41.2 mmHg (ref 35.0–45.0)
pCO2 arterial: 46.3 mmHg — ABNORMAL HIGH (ref 35.0–45.0)
pCO2 arterial: 53.9 mmHg — ABNORMAL HIGH (ref 35.0–45.0)
pH, Arterial: 7.381 (ref 7.350–7.450)
pH, Arterial: 7.385 (ref 7.350–7.450)
pO2, Arterial: 468 mmHg — ABNORMAL HIGH (ref 80.0–100.0)
pO2, Arterial: 89 mmHg (ref 80.0–100.0)
pO2, Arterial: 74 mmHg — ABNORMAL LOW (ref 80.0–100.0)

## 2014-03-18 LAB — BASIC METABOLIC PANEL
BUN: 21 mg/dL (ref 6–23)
CO2: 26 meq/L (ref 19–32)
Calcium: 9.4 mg/dL (ref 8.4–10.5)
Chloride: 102 mEq/L (ref 96–112)
Creatinine, Ser: 1.26 mg/dL (ref 0.50–1.35)
GFR calc Af Amer: 76 mL/min — ABNORMAL LOW (ref >90)
GFR calc non Af Amer: 65 mL/min — ABNORMAL LOW (ref >90)
GLUCOSE: 83 mg/dL (ref 70–99)
POTASSIUM: 4.1 meq/L (ref 3.7–5.3)
Sodium: 141 mEq/L (ref 137–147)

## 2014-03-18 LAB — CBC
HCT: 33.9 % — ABNORMAL LOW (ref 39.0–52.0)
HEMATOCRIT: 40.1 % (ref 39.0–52.0)
HEMATOCRIT: 33.3 % — AB (ref 39.0–52.0)
HEMOGLOBIN: 14 g/dL (ref 13.0–17.0)
Hemoglobin: 11.9 g/dL — ABNORMAL LOW (ref 13.0–17.0)
Hemoglobin: 11.8 g/dL — ABNORMAL LOW (ref 13.0–17.0)
MCH: 30.6 pg (ref 26.0–34.0)
MCH: 31.2 pg (ref 26.0–34.0)
MCH: 30.3 pg (ref 26.0–34.0)
MCHC: 34.9 g/dL (ref 30.0–36.0)
MCHC: 35.7 g/dL (ref 30.0–36.0)
MCHC: 34.8 g/dL (ref 30.0–36.0)
MCV: 87.6 fL (ref 78.0–100.0)
MCV: 87.2 fL (ref 78.0–100.0)
MCV: 86.9 fL (ref 78.0–100.0)
PLATELETS: 155 10*3/uL (ref 150–400)
Platelets: 214 10*3/uL (ref 150–400)
Platelets: 134 10*3/uL — ABNORMAL LOW (ref 150–400)
RBC: 4.58 MIL/uL (ref 4.22–5.81)
RBC: 3.82 MIL/uL — AB (ref 4.22–5.81)
RBC: 3.9 MIL/uL — ABNORMAL LOW (ref 4.22–5.81)
RDW: 12.6 % (ref 11.5–15.5)
RDW: 12.5 % (ref 11.5–15.5)
RDW: 12.5 % (ref 11.5–15.5)
WBC: 7.6 10*3/uL (ref 4.0–10.5)
WBC: 11.6 10*3/uL — ABNORMAL HIGH (ref 4.0–10.5)
WBC: 17 10*3/uL — ABNORMAL HIGH (ref 4.0–10.5)

## 2014-03-18 LAB — POCT I-STAT 4, (NA,K, GLUC, HGB,HCT)
GLUCOSE: 117 mg/dL — AB (ref 70–99)
GLUCOSE: 114 mg/dL — AB (ref 70–99)
GLUCOSE: 118 mg/dL — AB (ref 70–99)
GLUCOSE: 100 mg/dL — AB (ref 70–99)
Glucose, Bld: 111 mg/dL — ABNORMAL HIGH (ref 70–99)
Glucose, Bld: 104 mg/dL — ABNORMAL HIGH (ref 70–99)
Glucose, Bld: 118 mg/dL — ABNORMAL HIGH (ref 70–99)
HCT: 39 % (ref 39.0–52.0)
HCT: 33 % — ABNORMAL LOW (ref 39.0–52.0)
HCT: 32 % — ABNORMAL LOW (ref 39.0–52.0)
HCT: 32 % — ABNORMAL LOW (ref 39.0–52.0)
HCT: 30 % — ABNORMAL LOW (ref 39.0–52.0)
HCT: 33 % — ABNORMAL LOW (ref 39.0–52.0)
HEMATOCRIT: 37 % — AB (ref 39.0–52.0)
HEMOGLOBIN: 13.3 g/dL (ref 13.0–17.0)
HEMOGLOBIN: 10.9 g/dL — AB (ref 13.0–17.0)
HEMOGLOBIN: 11.2 g/dL — AB (ref 13.0–17.0)
Hemoglobin: 12.6 g/dL — ABNORMAL LOW (ref 13.0–17.0)
Hemoglobin: 11.2 g/dL — ABNORMAL LOW (ref 13.0–17.0)
Hemoglobin: 10.9 g/dL — ABNORMAL LOW (ref 13.0–17.0)
Hemoglobin: 10.2 g/dL — ABNORMAL LOW (ref 13.0–17.0)
POTASSIUM: 4.3 meq/L (ref 3.7–5.3)
POTASSIUM: 4.4 meq/L (ref 3.7–5.3)
POTASSIUM: 5.8 meq/L — AB (ref 3.7–5.3)
POTASSIUM: 3.9 meq/L (ref 3.7–5.3)
Potassium: 4.4 mEq/L (ref 3.7–5.3)
Potassium: 5 mEq/L (ref 3.7–5.3)
Potassium: 5 mEq/L (ref 3.7–5.3)
Sodium: 139 mEq/L (ref 137–147)
Sodium: 138 mEq/L (ref 137–147)
Sodium: 139 mEq/L (ref 137–147)
Sodium: 135 mEq/L — ABNORMAL LOW (ref 137–147)
Sodium: 136 mEq/L — ABNORMAL LOW (ref 137–147)
Sodium: 136 mEq/L — ABNORMAL LOW (ref 137–147)
Sodium: 138 mEq/L (ref 137–147)

## 2014-03-18 LAB — PROTIME-INR
INR: 1.37 (ref 0.00–1.49)
Prothrombin Time: 16.5 seconds — ABNORMAL HIGH (ref 11.6–15.2)

## 2014-03-18 LAB — GLUCOSE, CAPILLARY
GLUCOSE-CAPILLARY: 129 mg/dL — AB (ref 70–99)
Glucose-Capillary: 102 mg/dL — ABNORMAL HIGH (ref 70–99)
Glucose-Capillary: 108 mg/dL — ABNORMAL HIGH (ref 70–99)
Glucose-Capillary: 111 mg/dL — ABNORMAL HIGH (ref 70–99)
Glucose-Capillary: 118 mg/dL — ABNORMAL HIGH (ref 70–99)
Glucose-Capillary: 99 mg/dL (ref 70–99)
Glucose-Capillary: 132 mg/dL — ABNORMAL HIGH (ref 70–99)
Glucose-Capillary: 114 mg/dL — ABNORMAL HIGH (ref 70–99)

## 2014-03-18 LAB — POCT I-STAT, CHEM 8
BUN: 17 mg/dL (ref 6–23)
CALCIUM ION: 1.22 mmol/L (ref 1.12–1.23)
Chloride: 104 mEq/L (ref 96–112)
Creatinine, Ser: 1.2 mg/dL (ref 0.50–1.35)
Glucose, Bld: 118 mg/dL — ABNORMAL HIGH (ref 70–99)
HCT: 34 % — ABNORMAL LOW (ref 39.0–52.0)
HEMOGLOBIN: 11.6 g/dL — AB (ref 13.0–17.0)
Potassium: 4.4 mEq/L (ref 3.7–5.3)
SODIUM: 142 meq/L (ref 137–147)
TCO2: 24 mmol/L (ref 0–100)

## 2014-03-18 LAB — MAGNESIUM: Magnesium: 2.9 mg/dL — ABNORMAL HIGH (ref 1.5–2.5)

## 2014-03-18 LAB — CREATININE, SERUM
Creatinine, Ser: 1.11 mg/dL (ref 0.50–1.35)
GFR calc Af Amer: 88 mL/min — ABNORMAL LOW (ref >90)
GFR calc non Af Amer: 76 mL/min — ABNORMAL LOW (ref >90)

## 2014-03-18 LAB — ABO/RH: ABO/RH(D): A POS

## 2014-03-18 LAB — POCT I-STAT 3, VENOUS BLOOD GAS (G3P V)
Acid-base deficit: 1 mmol/L (ref 0.0–2.0)
BICARBONATE: 25.4 meq/L — AB (ref 20.0–24.0)
O2 Saturation: 83 %
TCO2: 27 mmol/L (ref 0–100)
pCO2, Ven: 47.7 mmHg (ref 45.0–50.0)
pH, Ven: 7.335 — ABNORMAL HIGH (ref 7.250–7.300)
pO2, Ven: 51 mmHg — ABNORMAL HIGH (ref 30.0–45.0)

## 2014-03-18 LAB — APTT: aPTT: 30 seconds (ref 24–37)

## 2014-03-18 LAB — HEMOGLOBIN AND HEMATOCRIT, BLOOD
HCT: 31.7 % — ABNORMAL LOW (ref 39.0–52.0)
Hemoglobin: 11.3 g/dL — ABNORMAL LOW (ref 13.0–17.0)

## 2014-03-18 LAB — HEMOGLOBIN A1C
HEMOGLOBIN A1C: 5.5 % (ref <5.7)
MEAN PLASMA GLUCOSE: 111 mg/dL (ref <117)

## 2014-03-18 LAB — PLATELET COUNT: PLATELETS: 169 10*3/uL (ref 150–400)

## 2014-03-18 LAB — HEPARIN LEVEL (UNFRACTIONATED): Heparin Unfractionated: 0.35 IU/mL (ref 0.30–0.70)

## 2014-03-18 SURGERY — CORONARY ARTERY BYPASS GRAFTING (CABG)
Anesthesia: General | Site: Chest

## 2014-03-18 MED ORDER — VANCOMYCIN HCL IN DEXTROSE 1-5 GM/200ML-% IV SOLN
1000.0000 mg | Freq: Once | INTRAVENOUS | Status: AC
  Administered 2014-03-18: 1000 mg via INTRAVENOUS
  Filled 2014-03-18: qty 200

## 2014-03-18 MED ORDER — SODIUM CHLORIDE 0.9 % IV SOLN
INTRAVENOUS | Status: DC
  Administered 2014-03-18: 14:00:00 via INTRAVENOUS

## 2014-03-18 MED ORDER — PLASMA-LYTE 148 IV SOLN
INTRAVENOUS | Status: DC | PRN
  Administered 2014-03-18: 09:00:00 via INTRAVASCULAR

## 2014-03-18 MED ORDER — PANTOPRAZOLE SODIUM 40 MG PO TBEC
40.0000 mg | DELAYED_RELEASE_TABLET | Freq: Every day | ORAL | Status: DC
  Administered 2014-03-20: 40 mg via ORAL
  Filled 2014-03-18 (×2): qty 1

## 2014-03-18 MED ORDER — PROPOFOL 10 MG/ML IV BOLUS
INTRAVENOUS | Status: AC
  Filled 2014-03-18: qty 20

## 2014-03-18 MED ORDER — LACTATED RINGERS IV SOLN
INTRAVENOUS | Status: DC | PRN
  Administered 2014-03-18: 07:00:00 via INTRAVENOUS

## 2014-03-18 MED ORDER — METOCLOPRAMIDE HCL 5 MG/ML IJ SOLN
10.0000 mg | Freq: Four times a day (QID) | INTRAMUSCULAR | Status: AC
  Administered 2014-03-18 – 2014-03-19 (×3): 10 mg via INTRAVENOUS
  Filled 2014-03-18 (×2): qty 2

## 2014-03-18 MED ORDER — POTASSIUM CHLORIDE 10 MEQ/50ML IV SOLN: 10.0000 meq | INTRAVENOUS | Status: DC | PRN

## 2014-03-18 MED ORDER — HEMOSTATIC AGENTS (NO CHARGE) OPTIME
TOPICAL | Status: DC | PRN
  Administered 2014-03-18: 1 via TOPICAL

## 2014-03-18 MED ORDER — DEXMEDETOMIDINE HCL IN NACL 400 MCG/100ML IV SOLN
0.4000 ug/kg/h | INTRAVENOUS | Status: DC
Start: 2014-03-18 — End: 2014-03-18
  Filled 2014-03-18: qty 100

## 2014-03-18 MED ORDER — GLYCOPYRROLATE 0.2 MG/ML IJ SOLN
INTRAMUSCULAR | Status: AC
  Filled 2014-03-18: qty 2

## 2014-03-18 MED ORDER — ROCURONIUM BROMIDE 100 MG/10ML IV SOLN
INTRAVENOUS | Status: DC | PRN
  Administered 2014-03-18: 40 mg via INTRAVENOUS
  Administered 2014-03-18: 50 mg via INTRAVENOUS
  Administered 2014-03-18: 60 mg via INTRAVENOUS
  Administered 2014-03-18: 50 mg via INTRAVENOUS

## 2014-03-18 MED ORDER — VECURONIUM BROMIDE 10 MG IV SOLR
INTRAVENOUS | Status: DC | PRN
  Administered 2014-03-18 (×2): 5 mg via INTRAVENOUS

## 2014-03-18 MED ORDER — ACETAMINOPHEN 160 MG/5ML PO SOLN: 1000.0000 mg | Freq: Four times a day (QID) | ORAL | Status: DC

## 2014-03-18 MED ORDER — FENTANYL CITRATE 0.05 MG/ML IJ SOLN
INTRAMUSCULAR | Status: AC
  Filled 2014-03-18: qty 5

## 2014-03-18 MED ORDER — MAGNESIUM SULFATE 40 MG/ML IJ SOLN
INTRAMUSCULAR | Status: AC
  Administered 2014-03-18: 4 g via INTRAVENOUS
  Filled 2014-03-18: qty 100

## 2014-03-18 MED ORDER — BISACODYL 5 MG PO TBEC
10.0000 mg | DELAYED_RELEASE_TABLET | Freq: Every day | ORAL | Status: DC
  Administered 2014-03-19 – 2014-03-20 (×2): 10 mg via ORAL
  Filled 2014-03-18 (×2): qty 2

## 2014-03-18 MED ORDER — SODIUM CHLORIDE 0.9 % IV SOLN: 250.0000 mL | INTRAVENOUS | Status: DC

## 2014-03-18 MED ORDER — PROPOFOL 10 MG/ML IV BOLUS
INTRAVENOUS | Status: DC | PRN
  Administered 2014-03-18: 150 mg via INTRAVENOUS

## 2014-03-18 MED ORDER — 0.9 % SODIUM CHLORIDE (POUR BTL) OPTIME
TOPICAL | Status: DC | PRN
  Administered 2014-03-18: 6000 mL

## 2014-03-18 MED ORDER — BISACODYL 10 MG RE SUPP: 10.0000 mg | Freq: Every day | RECTAL | Status: DC

## 2014-03-18 MED ORDER — THROMBIN 20000 UNITS EX SOLR
CUTANEOUS | Status: AC
  Filled 2014-03-18: qty 20000

## 2014-03-18 MED ORDER — MORPHINE SULFATE 2 MG/ML IJ SOLN
1.0000 mg | INTRAMUSCULAR | Status: AC | PRN
  Filled 2014-03-18: qty 1

## 2014-03-18 MED ORDER — SODIUM CHLORIDE 0.9 % IV SOLN
INTRAVENOUS | Status: DC
  Administered 2014-03-18: 1.7 [IU]/h via INTRAVENOUS
  Filled 2014-03-18: qty 1

## 2014-03-18 MED ORDER — LACTATED RINGERS IV SOLN
INTRAVENOUS | Status: DC
  Administered 2014-03-18: 14:00:00 via INTRAVENOUS

## 2014-03-18 MED ORDER — DOCUSATE SODIUM 100 MG PO CAPS
200.0000 mg | ORAL_CAPSULE | Freq: Every day | ORAL | Status: DC
  Administered 2014-03-19 – 2014-03-20 (×2): 200 mg via ORAL
  Filled 2014-03-18 (×2): qty 2

## 2014-03-18 MED ORDER — METOPROLOL TARTRATE 1 MG/ML IV SOLN: 2.5000 mg | INTRAVENOUS | Status: DC | PRN

## 2014-03-18 MED ORDER — VECURONIUM BROMIDE 10 MG IV SOLR
INTRAVENOUS | Status: AC
  Filled 2014-03-18: qty 10

## 2014-03-18 MED ORDER — SODIUM CHLORIDE 0.9 % IV SOLN
INTRAVENOUS | Status: DC | PRN
  Administered 2014-03-18: 12:00:00 via INTRAVENOUS

## 2014-03-18 MED ORDER — DEXTROSE 5 % IV SOLN
1.5000 g | Freq: Two times a day (BID) | INTRAVENOUS | Status: AC
  Administered 2014-03-18 – 2014-03-20 (×4): 1.5 g via INTRAVENOUS
  Filled 2014-03-18 (×4): qty 1.5

## 2014-03-18 MED ORDER — METOPROLOL TARTRATE 12.5 MG HALF TABLET
ORAL_TABLET | ORAL | Status: DC | PRN
  Administered 2014-03-18: 12.5 mg via ORAL

## 2014-03-18 MED ORDER — LIDOCAINE HCL (CARDIAC) 20 MG/ML IV SOLN
INTRAVENOUS | Status: AC
  Filled 2014-03-18: qty 5

## 2014-03-18 MED ORDER — ALBUMIN HUMAN 5 % IV SOLN
INTRAVENOUS | Status: DC | PRN
  Administered 2014-03-18 (×2): via INTRAVENOUS

## 2014-03-18 MED ORDER — LACTATED RINGERS IV SOLN: 500.0000 mL | Freq: Once | INTRAVENOUS | Status: AC | PRN

## 2014-03-18 MED ORDER — THROMBIN 20000 UNITS EX SOLR
OROMUCOSAL | Status: DC | PRN
  Administered 2014-03-18 (×3): via TOPICAL

## 2014-03-18 MED ORDER — ALBUMIN HUMAN 5 % IV SOLN
250.0000 mL | INTRAVENOUS | Status: AC | PRN
  Administered 2014-03-18 (×4): 250 mL via INTRAVENOUS
  Filled 2014-03-18 (×2): qty 250

## 2014-03-18 MED ORDER — ONDANSETRON HCL 4 MG/2ML IJ SOLN
4.0000 mg | Freq: Four times a day (QID) | INTRAMUSCULAR | Status: DC | PRN
  Administered 2014-03-18 – 2014-03-20 (×2): 4 mg via INTRAVENOUS
  Filled 2014-03-18 (×2): qty 2

## 2014-03-18 MED ORDER — MIDAZOLAM HCL 2 MG/2ML IJ SOLN
INTRAMUSCULAR | Status: AC
  Filled 2014-03-18: qty 2

## 2014-03-18 MED ORDER — SUCCINYLCHOLINE CHLORIDE 20 MG/ML IJ SOLN
INTRAMUSCULAR | Status: AC
  Filled 2014-03-18: qty 1

## 2014-03-18 MED ORDER — ACETAMINOPHEN 500 MG PO TABS
1000.0000 mg | ORAL_TABLET | Freq: Four times a day (QID) | ORAL | Status: DC
  Administered 2014-03-18 – 2014-03-20 (×7): 1000 mg via ORAL
  Filled 2014-03-18 (×11): qty 2

## 2014-03-18 MED ORDER — MAGNESIUM SULFATE 4000MG/100ML IJ SOLN
4.0000 g | Freq: Once | INTRAMUSCULAR | Status: AC
  Administered 2014-03-18: 4 g via INTRAVENOUS

## 2014-03-18 MED ORDER — ARTIFICIAL TEARS OP OINT
TOPICAL_OINTMENT | OPHTHALMIC | Status: AC
  Filled 2014-03-18: qty 3.5

## 2014-03-18 MED ORDER — METOPROLOL TARTRATE 25 MG/10 ML ORAL SUSPENSION
12.5000 mg | Freq: Two times a day (BID) | ORAL | Status: DC
  Administered 2014-03-18: 12.5 mg
  Filled 2014-03-18 (×5): qty 5

## 2014-03-18 MED ORDER — ASPIRIN 81 MG PO CHEW: 324.0000 mg | CHEWABLE_TABLET | Freq: Every day | ORAL | Status: DC

## 2014-03-18 MED ORDER — ROCURONIUM BROMIDE 50 MG/5ML IV SOLN
INTRAVENOUS | Status: AC
  Filled 2014-03-18: qty 1

## 2014-03-18 MED ORDER — SODIUM CHLORIDE 0.45 % IV SOLN
INTRAVENOUS | Status: DC
  Administered 2014-03-18: 14:00:00 via INTRAVENOUS

## 2014-03-18 MED ORDER — DEXMEDETOMIDINE HCL IN NACL 200 MCG/50ML IV SOLN
0.1000 ug/kg/h | INTRAVENOUS | Status: DC
  Administered 2014-03-18: 0.1 ug/kg/h via INTRAVENOUS

## 2014-03-18 MED ORDER — SODIUM CHLORIDE 0.9 % IJ SOLN
3.0000 mL | Freq: Two times a day (BID) | INTRAMUSCULAR | Status: DC
  Administered 2014-03-19: 3 mL via INTRAVENOUS
  Administered 2014-03-20: 09:00:00 via INTRAVENOUS

## 2014-03-18 MED ORDER — SODIUM CHLORIDE 0.9 % IJ SOLN: 3.0000 mL | INTRAMUSCULAR | Status: DC | PRN

## 2014-03-18 MED ORDER — GLYCOPYRROLATE 0.2 MG/ML IJ SOLN
INTRAMUSCULAR | Status: DC | PRN
  Administered 2014-03-18: 0.4 mg via INTRAVENOUS

## 2014-03-18 MED ORDER — MIDAZOLAM HCL 2 MG/2ML IJ SOLN: 2.0000 mg | INTRAMUSCULAR | Status: DC | PRN

## 2014-03-18 MED ORDER — PROTAMINE SULFATE 10 MG/ML IV SOLN
INTRAVENOUS | Status: DC | PRN
  Administered 2014-03-18: 350 mg via INTRAVENOUS

## 2014-03-18 MED ORDER — PHENYLEPHRINE HCL 10 MG/ML IJ SOLN
10.0000 mg | INTRAVENOUS | Status: DC | PRN
  Administered 2014-03-18: 20 ug/min via INTRAVENOUS

## 2014-03-18 MED ORDER — ASPIRIN EC 325 MG PO TBEC
325.0000 mg | DELAYED_RELEASE_TABLET | Freq: Every day | ORAL | Status: DC
  Administered 2014-03-19 – 2014-03-20 (×2): 325 mg via ORAL
  Filled 2014-03-18 (×2): qty 1

## 2014-03-18 MED ORDER — HEPARIN SODIUM (PORCINE) 1000 UNIT/ML IJ SOLN
INTRAMUSCULAR | Status: AC
  Filled 2014-03-18: qty 1

## 2014-03-18 MED ORDER — OXYCODONE HCL 5 MG PO TABS
5.0000 mg | ORAL_TABLET | ORAL | Status: DC | PRN
  Administered 2014-03-18 – 2014-03-19 (×4): 10 mg via ORAL
  Administered 2014-03-19: 5 mg via ORAL
  Administered 2014-03-19 – 2014-03-20 (×3): 10 mg via ORAL
  Administered 2014-03-20: 5 mg via ORAL
  Filled 2014-03-18 (×8): qty 2
  Filled 2014-03-18: qty 1

## 2014-03-18 MED ORDER — METOPROLOL TARTRATE 12.5 MG HALF TABLET
12.5000 mg | ORAL_TABLET | Freq: Two times a day (BID) | ORAL | Status: DC
  Administered 2014-03-19 – 2014-03-20 (×2): 12.5 mg via ORAL
  Filled 2014-03-18 (×5): qty 1

## 2014-03-18 MED ORDER — NITROGLYCERIN IN D5W 200-5 MCG/ML-% IV SOLN
0.0000 ug/min | INTRAVENOUS | Status: DC
  Administered 2014-03-18: 10 ug/min via INTRAVENOUS

## 2014-03-18 MED ORDER — ACETAMINOPHEN 650 MG RE SUPP: 650.0000 mg | Freq: Once | RECTAL | Status: AC

## 2014-03-18 MED ORDER — MIDAZOLAM HCL 5 MG/5ML IJ SOLN
INTRAMUSCULAR | Status: DC | PRN
  Administered 2014-03-18: 3 mg via INTRAVENOUS
  Administered 2014-03-18: 1 mg via INTRAVENOUS
  Administered 2014-03-18: 2 mg via INTRAVENOUS
  Administered 2014-03-18 (×2): 3 mg via INTRAVENOUS

## 2014-03-18 MED ORDER — PROTAMINE SULFATE 10 MG/ML IV SOLN
INTRAVENOUS | Status: AC
  Filled 2014-03-18: qty 25

## 2014-03-18 MED ORDER — ACETAMINOPHEN 160 MG/5ML PO SOLN
650.0000 mg | Freq: Once | ORAL | Status: AC
  Administered 2014-03-18: 650 mg

## 2014-03-18 MED ORDER — MIDAZOLAM HCL 10 MG/2ML IJ SOLN
INTRAMUSCULAR | Status: AC
  Filled 2014-03-18: qty 2

## 2014-03-18 MED ORDER — KETOROLAC TROMETHAMINE 15 MG/ML IJ SOLN
15.0000 mg | Freq: Four times a day (QID) | INTRAMUSCULAR | Status: AC | PRN
  Administered 2014-03-18 – 2014-03-22 (×9): 15 mg via INTRAVENOUS
  Filled 2014-03-18 (×9): qty 1

## 2014-03-18 MED ORDER — HEPARIN SODIUM (PORCINE) 1000 UNIT/ML IJ SOLN
INTRAMUSCULAR | Status: DC | PRN
  Administered 2014-03-18: 35000 [IU] via INTRAVENOUS

## 2014-03-18 MED ORDER — INSULIN REGULAR BOLUS VIA INFUSION
0.0000 [IU] | Freq: Three times a day (TID) | INTRAVENOUS | Status: DC
  Filled 2014-03-18: qty 10

## 2014-03-18 MED ORDER — FENTANYL CITRATE 0.05 MG/ML IJ SOLN
INTRAMUSCULAR | Status: DC | PRN
  Administered 2014-03-18 (×2): 100 ug via INTRAVENOUS
  Administered 2014-03-18: 50 ug via INTRAVENOUS
  Administered 2014-03-18 (×4): 100 ug via INTRAVENOUS
  Administered 2014-03-18: 150 ug via INTRAVENOUS
  Administered 2014-03-18: 425 ug via INTRAVENOUS
  Administered 2014-03-18: 100 ug via INTRAVENOUS
  Administered 2014-03-18: 75 ug via INTRAVENOUS
  Administered 2014-03-18: 100 ug via INTRAVENOUS

## 2014-03-18 MED ORDER — MORPHINE SULFATE 2 MG/ML IJ SOLN
2.0000 mg | INTRAMUSCULAR | Status: DC | PRN
  Administered 2014-03-18 (×2): 2 mg via INTRAVENOUS
  Administered 2014-03-18: 4 mg via INTRAVENOUS
  Administered 2014-03-18: 2 mg via INTRAVENOUS
  Administered 2014-03-18 – 2014-03-20 (×12): 4 mg via INTRAVENOUS
  Administered 2014-03-20: 2 mg via INTRAVENOUS
  Administered 2014-03-20 (×3): 4 mg via INTRAVENOUS
  Administered 2014-03-20: 2 mg via INTRAVENOUS
  Filled 2014-03-18: qty 2
  Filled 2014-03-18: qty 1
  Filled 2014-03-18 (×6): qty 2
  Filled 2014-03-18 (×2): qty 1
  Filled 2014-03-18 (×6): qty 2
  Filled 2014-03-18: qty 1
  Filled 2014-03-18 (×4): qty 2

## 2014-03-18 MED ORDER — FAMOTIDINE IN NACL 20-0.9 MG/50ML-% IV SOLN
20.0000 mg | Freq: Two times a day (BID) | INTRAVENOUS | Status: AC
  Administered 2014-03-18: 20 mg via INTRAVENOUS

## 2014-03-18 MED ORDER — POTASSIUM CHLORIDE 10 MEQ/50ML IV SOLN
10.0000 meq | INTRAVENOUS | Status: AC
  Administered 2014-03-18 (×3): 10 meq via INTRAVENOUS

## 2014-03-18 MED ORDER — PHENYLEPHRINE HCL 10 MG/ML IJ SOLN: INTRAMUSCULAR | Status: DC | PRN

## 2014-03-18 MED ORDER — ARTIFICIAL TEARS OP OINT
TOPICAL_OINTMENT | OPHTHALMIC | Status: DC | PRN
  Administered 2014-03-18: 1 via OPHTHALMIC

## 2014-03-18 MED ORDER — PHENYLEPHRINE HCL 10 MG/ML IJ SOLN
0.0000 ug/min | INTRAVENOUS | Status: DC
  Administered 2014-03-18: 10 ug/min via INTRAVENOUS
  Administered 2014-03-19: 40 ug/min via INTRAVENOUS
  Filled 2014-03-18 (×2): qty 2

## 2014-03-18 MED FILL — Sodium Chloride IV Soln 0.9%: INTRAVENOUS | Qty: 2000 | Status: AC

## 2014-03-18 MED FILL — Electrolyte-R (PH 7.4) Solution: INTRAVENOUS | Qty: 3000 | Status: AC

## 2014-03-18 MED FILL — Sodium Bicarbonate IV Soln 8.4%: INTRAVENOUS | Qty: 50 | Status: AC

## 2014-03-18 MED FILL — Mannitol IV Soln 20%: INTRAVENOUS | Qty: 500 | Status: AC

## 2014-03-18 MED FILL — Lidocaine HCl IV Inj 20 MG/ML: INTRAVENOUS | Qty: 5 | Status: AC

## 2014-03-18 MED FILL — Heparin Sodium (Porcine) Inj 1000 Unit/ML: INTRAMUSCULAR | Qty: 10 | Status: AC

## 2014-03-18 SURGICAL SUPPLY — 106 items
ATTRACTOMAT 16X20 MAGNETIC DRP (DRAPES) ×2 IMPLANT
BAG DECANTER FOR FLEXI CONT (MISCELLANEOUS) ×2 IMPLANT
BANDAGE ELASTIC 4 VELCRO ST LF (GAUZE/BANDAGES/DRESSINGS) ×2 IMPLANT
BANDAGE ELASTIC 6 VELCRO ST LF (GAUZE/BANDAGES/DRESSINGS) ×2 IMPLANT
BANDAGE GAUZE ELAST BULKY 4 IN (GAUZE/BANDAGES/DRESSINGS) ×2 IMPLANT
BASKET HEART (ORDER IN 25'S) (MISCELLANEOUS) ×1
BASKET HEART (ORDER IN 25S) (MISCELLANEOUS) ×1 IMPLANT
BLADE STERNUM SYSTEM 6 (BLADE) ×2 IMPLANT
BLADE SURG 11 STRL SS (BLADE) ×2 IMPLANT
CANISTER SUCTION 2500CC (MISCELLANEOUS) ×2 IMPLANT
CANNULA ARTERIAL VENT 3/8 20FR (CANNULA) ×2 IMPLANT
CARDIAC SUCTION (MISCELLANEOUS) ×2 IMPLANT
CATH ROBINSON RED A/P 18FR (CATHETERS) ×4 IMPLANT
CATH THORACIC 28FR (CATHETERS) ×2 IMPLANT
CATH THORACIC 36FR (CATHETERS) ×2 IMPLANT
CATH THORACIC 36FR RT ANG (CATHETERS) ×2 IMPLANT
CLIP TI MEDIUM 24 (CLIP) IMPLANT
CLIP TI WIDE RED SMALL 24 (CLIP) ×4 IMPLANT
COVER SURGICAL LIGHT HANDLE (MISCELLANEOUS) ×2 IMPLANT
CRADLE DONUT ADULT HEAD (MISCELLANEOUS) ×2 IMPLANT
DERMABOND ADVANCED (GAUZE/BANDAGES/DRESSINGS) ×1
DERMABOND ADVANCED .7 DNX12 (GAUZE/BANDAGES/DRESSINGS) ×1 IMPLANT
DRAPE CARDIOVASCULAR INCISE (DRAPES) ×1
DRAPE SLUSH/WARMER DISC (DRAPES) ×2 IMPLANT
DRAPE SRG 135X102X78XABS (DRAPES) ×1 IMPLANT
DRSG COVADERM 4X14 (GAUZE/BANDAGES/DRESSINGS) ×2 IMPLANT
ELECT CAUTERY BLADE 6.4 (BLADE) ×2 IMPLANT
ELECT REM PT RETURN 9FT ADLT (ELECTROSURGICAL) ×4
ELECTRODE REM PT RTRN 9FT ADLT (ELECTROSURGICAL) ×2 IMPLANT
GLOVE BIO SURGEON STRL SZ 6 (GLOVE) ×6 IMPLANT
GLOVE BIO SURGEON STRL SZ 6.5 (GLOVE) ×14 IMPLANT
GLOVE BIO SURGEON STRL SZ7 (GLOVE) IMPLANT
GLOVE BIO SURGEON STRL SZ7.5 (GLOVE) IMPLANT
GLOVE BIOGEL PI IND STRL 6 (GLOVE) ×5 IMPLANT
GLOVE BIOGEL PI IND STRL 6.5 (GLOVE) IMPLANT
GLOVE BIOGEL PI IND STRL 7.0 (GLOVE) IMPLANT
GLOVE BIOGEL PI INDICATOR 6 (GLOVE) ×5
GLOVE BIOGEL PI INDICATOR 6.5 (GLOVE)
GLOVE BIOGEL PI INDICATOR 7.0 (GLOVE)
GLOVE EUDERMIC 7 POWDERFREE (GLOVE) ×4 IMPLANT
GLOVE ORTHO TXT STRL SZ7.5 (GLOVE) IMPLANT
GOWN STRL REUS W/ TWL LRG LVL3 (GOWN DISPOSABLE) ×4 IMPLANT
GOWN STRL REUS W/ TWL XL LVL3 (GOWN DISPOSABLE) ×1 IMPLANT
GOWN STRL REUS W/TWL LRG LVL3 (GOWN DISPOSABLE) ×4
GOWN STRL REUS W/TWL XL LVL3 (GOWN DISPOSABLE) ×1
HEMOSTAT POWDER SURGIFOAM 1G (HEMOSTASIS) ×6 IMPLANT
HEMOSTAT SURGICEL 2X14 (HEMOSTASIS) ×2 IMPLANT
INSERT FOGARTY 61MM (MISCELLANEOUS) IMPLANT
INSERT FOGARTY XLG (MISCELLANEOUS) IMPLANT
KIT BASIN OR (CUSTOM PROCEDURE TRAY) ×2 IMPLANT
KIT CATH CPB BARTLE (MISCELLANEOUS) ×2 IMPLANT
KIT ROOM TURNOVER OR (KITS) ×2 IMPLANT
KIT SUCTION CATH 14FR (SUCTIONS) ×2 IMPLANT
KIT VASOVIEW W/TROCAR VH 2000 (KITS) ×2 IMPLANT
NS IRRIG 1000ML POUR BTL (IV SOLUTION) ×12 IMPLANT
PACK OPEN HEART (CUSTOM PROCEDURE TRAY) ×2 IMPLANT
PAD ARMBOARD 7.5X6 YLW CONV (MISCELLANEOUS) ×4 IMPLANT
PAD ELECT DEFIB RADIOL ZOLL (MISCELLANEOUS) ×2 IMPLANT
PENCIL BUTTON HOLSTER BLD 10FT (ELECTRODE) ×2 IMPLANT
PUNCH AORTIC ROTATE 4.0MM (MISCELLANEOUS) IMPLANT
PUNCH AORTIC ROTATE 4.5MM 8IN (MISCELLANEOUS) ×2 IMPLANT
PUNCH AORTIC ROTATE 5MM 8IN (MISCELLANEOUS) IMPLANT
SEALANT SURG COSEAL 4ML (VASCULAR PRODUCTS) ×2 IMPLANT
SENSOR MYOCARDIAL TEMP (MISCELLANEOUS) ×2 IMPLANT
SET CARDIOPLEGIA MPS 5001102 (MISCELLANEOUS) ×2 IMPLANT
SOLUTION ANTI FOG 6CC (MISCELLANEOUS) ×2 IMPLANT
SPONGE GAUZE 4X4 12PLY (GAUZE/BANDAGES/DRESSINGS) ×4 IMPLANT
SPONGE GAUZE 4X4 12PLY STER LF (GAUZE/BANDAGES/DRESSINGS) ×4 IMPLANT
SPONGE INTESTINAL PEANUT (DISPOSABLE) IMPLANT
SPONGE LAP 18X18 X RAY DECT (DISPOSABLE) ×2 IMPLANT
SPONGE LAP 4X18 X RAY DECT (DISPOSABLE) ×2 IMPLANT
SUT BONE WAX W31G (SUTURE) ×2 IMPLANT
SUT MNCRL AB 4-0 PS2 18 (SUTURE) ×2 IMPLANT
SUT PROLENE 3 0 SH DA (SUTURE) IMPLANT
SUT PROLENE 3 0 SH1 36 (SUTURE) ×2 IMPLANT
SUT PROLENE 4 0 RB 1 (SUTURE) ×1
SUT PROLENE 4 0 SH DA (SUTURE) IMPLANT
SUT PROLENE 4-0 RB1 .5 CRCL 36 (SUTURE) ×1 IMPLANT
SUT PROLENE 5 0 C 1 36 (SUTURE) IMPLANT
SUT PROLENE 6 0 C 1 30 (SUTURE) IMPLANT
SUT PROLENE 7 0 BV 1 (SUTURE) IMPLANT
SUT PROLENE 7 0 BV1 MDA (SUTURE) ×2 IMPLANT
SUT PROLENE 8 0 BV175 6 (SUTURE) IMPLANT
SUT SILK  1 MH (SUTURE)
SUT SILK 1 MH (SUTURE) IMPLANT
SUT STEEL STERNAL CCS#1 18IN (SUTURE) IMPLANT
SUT STEEL SZ 6 DBL 3X14 BALL (SUTURE) ×6 IMPLANT
SUT VIC AB 1 CTX 36 (SUTURE) ×3
SUT VIC AB 1 CTX36XBRD ANBCTR (SUTURE) ×3 IMPLANT
SUT VIC AB 2-0 CT1 27 (SUTURE) ×1
SUT VIC AB 2-0 CT1 TAPERPNT 27 (SUTURE) ×1 IMPLANT
SUT VIC AB 2-0 CTX 27 (SUTURE) IMPLANT
SUT VIC AB 3-0 SH 27 (SUTURE)
SUT VIC AB 3-0 SH 27X BRD (SUTURE) IMPLANT
SUT VIC AB 3-0 X1 27 (SUTURE) IMPLANT
SUT VICRYL 4-0 PS2 18IN ABS (SUTURE) IMPLANT
SUTURE E-PAK OPEN HEART (SUTURE) ×2 IMPLANT
SYSTEM SAHARA CHEST DRAIN ATS (WOUND CARE) ×2 IMPLANT
TAPE CLOTH SURG 4X10 WHT LF (GAUZE/BANDAGES/DRESSINGS) ×2 IMPLANT
TAPE PAPER 3X10 WHT MICROPORE (GAUZE/BANDAGES/DRESSINGS) ×2 IMPLANT
TOWEL OR 17X24 6PK STRL BLUE (TOWEL DISPOSABLE) ×2 IMPLANT
TOWEL OR 17X26 10 PK STRL BLUE (TOWEL DISPOSABLE) ×2 IMPLANT
TRAY FOLEY IC TEMP SENS 16FR (CATHETERS) ×2 IMPLANT
TUBING INSUFFLATION 10FT LAP (TUBING) ×2 IMPLANT
UNDERPAD 30X30 INCONTINENT (UNDERPADS AND DIAPERS) ×2 IMPLANT
WATER STERILE IRR 1000ML POUR (IV SOLUTION) ×4 IMPLANT

## 2014-03-18 NOTE — Brief Op Note (Signed)
03/14/2014 - 03/18/2014  11:14 AM  PATIENT:  Kenneth [name]  48 y.o. male  PRE-OPERATIVE DIAGNOSIS:  CAD  POST-OPERATIVE DIAGNOSIS:  coronary artery disease  PROCEDURE:  Procedure(s):  CORONARY ARTERY BYPASS GRAFTING  x 4  -LIMA to LAD -SVG to OM -SEQUENTIAL SVG to PDA and PLVB  ENDOSCOPIC SAPHENOUS VEIN HARVEST RIGHT LEG  INTRAOPERATIVE TRANSESOPHAGEAL ECHOCARDIOGRAM (N/A)  SURGEON:  Surgeon(s) and Role:    * Gaye Pollack, MD - Primary  PHYSICIAN ASSISTANT: Nimai Burbach PA-C  ANESTHESIA:   general  EBL:  Total I/O In: -  Out: 175 [Urine:175]  BLOOD ADMINISTERED: CELLSAVER  DRAINS: Left Pleural Chest Tubes, Mediastinal chest drains   LOCAL MEDICATIONS USED:  NONE  SPECIMEN:  No Specimen  DISPOSITION OF SPECIMEN:  N/A  COUNTS:  YES  TOURNIQUET:  * No tourniquets in log *  DICTATION: .Dragon Dictation  PLAN OF CARE: Admit to inpatient   PATIENT DISPOSITION:  ICU - intubated and hemodynamically stable.   Delay start of Pharmacological VTE agent (>24hrs) due to surgical blood loss or risk of bleeding: yes

## 2014-03-18 NOTE — Progress Notes (Signed)
Pre op Metoprolol 12.5 mg held due to HR 50. Jessie Foot, RN

## 2014-03-18 NOTE — Preoperative (Addendum)
Beta Blockers   Reason not to administer Beta Blockers:Not Applicable  Metoprolol 35.7 mg po admin 0708 3.24.15

## 2014-03-18 NOTE — Progress Notes (Signed)
Patient ID: Kenneth [name], male   DOB: 1966/06/14, 48 y.o.   MRN: [id number]  SICU Evening Rounds:   Hemodynamically stable on a little neo CI = 3.2  Extubated and alert. Having pain  Urine output good  CT output low  CBC    Component Value Date/Time   WBC 17.0* 03/18/2014 1800   RBC 3.90* 03/18/2014 1800   HGB 11.6* 03/18/2014 1835   HCT 34.0* 03/18/2014 1835   PLT 155 03/18/2014 1800   MCV 86.9 03/18/2014 1800   MCH 30.3 03/18/2014 1800   MCHC 34.8 03/18/2014 1800   RDW 12.5 03/18/2014 1800     BMET    Component Value Date/Time   NA 142 03/18/2014 1835   K 4.4 03/18/2014 1835   CL 104 03/18/2014 1835   CO2 26 03/18/2014 0510   GLUCOSE 118* 03/18/2014 1835   BUN 17 03/18/2014 1835   CREATININE 1.20 03/18/2014 1835   CREATININE 1.36* 03/13/2014 1100   CALCIUM 9.4 03/18/2014 0510   GFRNONAA 76* 03/18/2014 1800   GFRAA 88* 03/18/2014 1800     A/P:  Stable postop course. Continue current plans. Will add Toradol since creat has been normal.

## 2014-03-18 NOTE — OR Nursing (Signed)
1ST CALL TO SICU AT 1153

## 2014-03-18 NOTE — Anesthesia Postprocedure Evaluation (Signed)
  Anesthesia Post-op Note  Patient: Kenneth [name]  Procedure(s) Performed: Procedure(s): CORONARY ARTERY BYPASS GRAFTING (CABG) x 4 using endoscopically harvested right saphenous vein and left internal mammary artery (N/A) INTRAOPERATIVE TRANSESOPHAGEAL ECHOCARDIOGRAM (N/A)  Patient Location: SICU  Anesthesia Type:General  Level of Consciousness: sedated  Airway and Oxygen Therapy: Patient remains intubated per anesthesia plan and Patient placed on Ventilator (see vital sign flow sheet for setting)  Post-op Pain: none  Post-op Assessment: Post-op Vital signs reviewed, Patient's Cardiovascular Status Stable, Respiratory Function Stable, Patent Airway, No signs of Nausea or vomiting and Pain level controlled  Post-op Vital Signs: Reviewed and stable  Complications: No apparent anesthesia complications

## 2014-03-18 NOTE — OR Nursing (Signed)
2ND CALL TO SICU AT 1225

## 2014-03-18 NOTE — Anesthesia Procedure Notes (Signed)
Procedures

## 2014-03-18 NOTE — Care Management Note (Signed)
    Page 1 of 1   03/24/2014     11:35:45 AM   CARE MANAGEMENT NOTE 03/24/2014  Patient:  Kenneth [name], Kenneth [name]   Account Number:  [id number]  Date Initiated:  03/18/2014  Documentation initiated by:  GRAVES-BIGELOW,BRENDA  Subjective/Objective Assessment:   Pt admitted for L heart cath- revealed Severe multi-vessel coronary disease. S/p CABG 03-18-14.     Action/Plan:   CM to continue to monitor for disposition needs.   Anticipated DC Date:  03/21/2014   Anticipated DC Plan:  [place]  CM consult      Choice offered to / List presented to:             Status of service:  Completed, signed off Medicare Important Message given?   (If response is "NO", the following Medicare IM given date fields will be blank) Date Medicare IM given:   Date Additional Medicare IM given:    Discharge Disposition:  HOME/SELF CARE  Per UR Regulation:  Reviewed for med. necessity/level of care/duration of stay  If discussed at [place] of Stay Meetings, dates discussed:    Comments:  Contact:  Kenneth, [name] [phone]  03/24/14 Kenneth Heick,RN,BSN [phone] PT [place].  NO DC NEEDS IDENTIFIED.  03-18-14 3:10pm Luz Lex, RNBSN [phone] Post op CABG today.

## 2014-03-18 NOTE — Anesthesia Preprocedure Evaluation (Addendum)
Anesthesia Evaluation  Patient identified by MRN, date of birth, ID band Patient awake    Reviewed: Allergy & Precautions, H&P , NPO status , Patient's Chart, lab work & pertinent test results, reviewed documented beta blocker date and time   Airway Mallampati: I TM Distance: >3 FB Neck ROM: Full    Dental  (+) Teeth Intact, Dental Advisory Given   Pulmonary former smoker,  breath sounds clear to auscultation        Cardiovascular hypertension, Pt. on medications and Pt. on home beta blockers + angina with exertion and at rest + CAD Rhythm:Regular Rate:Normal     Neuro/Psych PSYCHIATRIC DISORDERS Anxiety negative neurological ROS     GI/Hepatic negative GI ROS, Neg liver ROS,   Endo/Other  negative endocrine ROS  Renal/GU negative Renal ROS  negative genitourinary   Musculoskeletal   Abdominal   Peds  Hematology negative hematology ROS (+)   Anesthesia Other Findings   Reproductive/Obstetrics negative OB ROS                          Anesthesia Physical Anesthesia Plan  ASA: IV  Anesthesia Plan: General   Post-op Pain Management:    Induction: Intravenous  Airway Management Planned: Oral ETT  Additional Equipment: PA Cath, TEE, Arterial line and Ultrasound Guidance Line Placement  Intra-op Plan:   Post-operative Plan: Post-operative intubation/ventilation  Informed Consent: I have reviewed the patients History and Physical, chart, labs and discussed the procedure including the risks, benefits and alternatives for the proposed anesthesia with the patient or authorized representative who has indicated his/her understanding and acceptance.   Dental advisory given  Plan Discussed with: CRNA, Anesthesiologist and Surgeon  Anesthesia Plan Comments:         Anesthesia Quick Evaluation

## 2014-03-18 NOTE — Transfer of Care (Signed)
Immediate Anesthesia Transfer of Care Note  Patient: Kenneth [name]  Procedure(s) Performed: Procedure(s): CORONARY ARTERY BYPASS GRAFTING (CABG) x 4 using endoscopically harvested right saphenous vein and left internal mammary artery (N/A) INTRAOPERATIVE TRANSESOPHAGEAL ECHOCARDIOGRAM (N/A)  Patient Location: SICU  Anesthesia Type:General  Level of Consciousness: sedated and Patient remains intubated per anesthesia plan  Airway & Oxygen Therapy: Patient remains intubated per anesthesia plan and Patient placed on Ventilator (see vital sign flow sheet for setting)  Post-op Assessment: Report given to SICU RN.  VSS.  Post vital signs: Reviewed and stable  Complications: No apparent anesthesia complications

## 2014-03-18 NOTE — Procedures (Signed)
Extubation Procedure Note  Patient Details:   Name: Eldredge Veldhuizen DOB: 1966-10-16 MRN: [id number]   Airway Documentation:     Evaluation  O2 sats: stable throughout Complications: No apparent complications Patient did tolerate procedure well. Bilateral Breath Sounds: Clear;Diminished NIF: -24, FVC: 669ml, IS: 500 . Placed on 4 L [place]. Pt. Tolerated well, RN at bedside. Yes  Revonda Standard 03/18/2014, 4:47 PM

## 2014-03-18 NOTE — Op Note (Signed)
CARDIOVASCULAR SURGERY OPERATIVE NOTE  03/18/2014  Surgeon:  Gaye Pollack, MD  First Assistant: Ellwood Handler,  PA-C   Preoperative Diagnosis:  Severe multi-vessel coronary artery disease   Postoperative Diagnosis:  Same   Procedure:  1. Median Sternotomy 2. Extracorporeal circulation 3.   Coronary artery bypass grafting x 4   Left internal mammary graft to the LAD  SVG to OM  Sequential SVG to PDA and PL  4.   Endoscopic vein harvest from the right leg   Anesthesia:  General Endotracheal   Clinical History/Surgical Indication:  The patient is a 48 year old gentleman with hypertension and hyperlipidemia and a strong family history of premature coronary disease who reports a history of intermittent chest discomfort since 2001 while living in [place]. He underwent several evaluations both cardiac as well as GI. He underwent a cardiac catheterization at least 5 years previously in [place] and was told of having normal epicardial coronary arteries. He was diagnosed with microvascular angina. He has been treated with nitroglycerin with improvement. He also completed in EECP treatment regimen. Since moving to the [place] area, he had seen Dr.Hilty by his report several years ago and was maintained on medical regimen. He has continued to experience exertional chest pain that seems to have increased over the past year. He has been having chest pain with minimal exertion as well as after eating. He saw Dr. Claiborne Billings on 01/28/2014. At that time he described accelerating chest pain pattern. He was referred for an exercise Myoview study that was high risk for inferior and lateral ischemia with chest pain. Cath today shows severe multi-vessel disease with occlusion of the LCX and RCA with high grade proximal LAD stenosis. There are collat to the distal RCA and OM. LV function is good. He has severe  multi-vessel coronary disease with progressive life style limiting angina. I agree that CABG is the best treatment to improve his quality of life and prevent further ischemia and infarction. I discussed the operative procedure with the patient and his wife including alternatives, benefits and risks; including but not limited to bleeding, blood transfusion, infection, stroke, myocardial infarction, graft failure, heart block requiring a permanent pacemaker, organ dysfunction, and death. [name] understands and agrees to proceed. I considered using both IMA's but he has severe diffuse coronary disease and the RIMA would not reach any vessel as a pedicle graft and may even be too short as a free graft. I also considered using a left radial artery graft but he is left handed and the right radial was used for his cath.    Preparation:  The patient was seen in the preoperative holding area and the correct patient, correct operation were confirmed with the patient after reviewing the medical record and catheterization. The consent was signed by me. Preoperative antibiotics were given. A pulmonary arterial line and radial arterial line were placed by the anesthesia team. The patient was taken back to the operating room and positioned supine on the operating room table. After being placed under general endotracheal anesthesia by the anesthesia team a foley catheter was placed. The neck, chest, abdomen, and both legs were prepped with betadine soap and solution and draped in the usual sterile manner. A surgical time-out was taken and the correct patient and operative procedure were confirmed with the nursing and anesthesia staff.   Cardiopulmonary Bypass:  A median sternotomy was performed. The pericardium was opened in the midline. Right ventricular function appeared normal. The ascending aorta was of normal  size and had no palpable plaque. There were no contraindications to aortic cannulation or  cross-clamping. The patient was fully systemically heparinized and the ACT was maintained > 400 sec. The proximal aortic arch was cannulated with a 20 F aortic cannula for arterial inflow. Venous cannulation was performed via the right atrial appendage using a two-staged venous cannula. An antegrade cardioplegia/vent cannula was inserted into the mid-ascending aorta. Aortic occlusion was performed with a single cross-clamp. Systemic cooling to 32 degrees Centigrade and topical cooling of the heart with iced saline were used. Hyperkalemic antegrade cold blood cardioplegia was used to induce diastolic arrest and was then given at about 20 minute intervals throughout the period of arrest to maintain myocardial temperature at or below 10 degrees centigrade. A temperature probe was inserted into the interventricular septum and an insulating pad was placed in the pericardium.   Left internal mammary harvest:  The left side of the sternum was retracted using the Rultract retractor. The left internal mammary artery was harvested as a pedicle graft. All side branches were clipped. It was a medium-sized vessel of good quality with excellent blood flow. It was ligated distally and divided. It was sprayed with topical papaverine solution to prevent vasospasm.   Endoscopic vein harvest:  The right greater saphenous vein was harvested endoscopically through a 2 cm incision medial to the right knee. It was harvested from the upper thigh to below the knee. It was a medium-sized vein of good quality. The side branches were all ligated with 4-0 silk ties.    Coronary arteries:  The coronary arteries were examined.   LAD:  Diffusely diseased. The only area that could be opened was distally near the apex. Small first diagonal that was too badly diseased to graft.  LCX:  The Ramus was diseased throughout the proximal portion then became intramyocardial. There was a small first OM that was not large enough to graft.  The OM2 was a moderate-sized graftable vessel with mild distal disease.  RCA:  The acute marginals were tiny. The main body of the RCA was a rock. The PDA was small and diffusely diseased but could be grafted. It looked much worse than it did by cath. There was also a PL branch that was small but graftable. It was moderately diseased.   Grafts:  1. LIMA to the LAD: 1.6 mm. It was sewn end to side using 8-0 prolene continuous suture. 2. SVG to OM1:  1.6 mm. It was sewn end to side using 7-0 prolene continuous suture. 3. Sequential SVG to PDA:  1.5 mm. It was sewn sequential side to side using 7-0 prolene continuous suture. 4. Sequential SVG to PL:  1.5 mm. It was sewn sequential end to side using 7-0 prolene continuous suture.  All of the distal anastomoses were tedious due to the severity of disease and size of the coronary arteries. The proximal vein graft anastomoses were performed to the mid-ascending aorta using continuous 6-0 prolene suture. Graft markers were placed around the proximal anastomoses.   Completion:  The patient was rewarmed to 37 degrees Centigrade. The clamp was removed from the LIMA pedicle and there was rapid warming of the septum and return of ventricular fibrillation. The crossclamp was removed with a time of 101 minutes. There was spontaneous return of sinus rhythm. The distal and proximal anastomoses were checked for hemostasis. The position of the grafts was satisfactory. Two temporary epicardial pacing wires were placed on the right atrium and two on the right ventricle.  The patient was weaned from CPB without difficulty on no inotropes. CPB time was 122 minutes. Cardiac output was 7 LPM. Heparin was fully reversed with protamine and the aortic and venous cannulas removed. Hemostasis was achieved. Mediastinal and left pleural drainage tubes were placed. The sternum was closed with double #6 stainless steel wires. The fascia was closed with continuous # 1 vicryl suture.  The subcutaneous tissue was closed with 2-0 vicryl continuous suture. The skin was closed with 3-0 vicryl subcuticular suture. All sponge, needle, and instrument counts were reported correct at the end of the case. Dry sterile dressings were placed over the incisions and around the chest tubes which were connected to pleurevac suction. The patient was then transported to the surgical intensive care unit in critical but stable condition.

## 2014-03-19 ENCOUNTER — Inpatient Hospital Stay (HOSPITAL_COMMUNITY): Payer: Managed Care, Other (non HMO)

## 2014-03-19 DIAGNOSIS — R012 Other cardiac sounds: Secondary | ICD-10-CM

## 2014-03-19 DIAGNOSIS — E785 Hyperlipidemia, unspecified: Secondary | ICD-10-CM

## 2014-03-19 DIAGNOSIS — Z01818 Encounter for other preprocedural examination: Secondary | ICD-10-CM

## 2014-03-19 DIAGNOSIS — I2510 Atherosclerotic heart disease of native coronary artery without angina pectoris: Secondary | ICD-10-CM

## 2014-03-19 LAB — GLUCOSE, CAPILLARY
GLUCOSE-CAPILLARY: 109 mg/dL — AB (ref 70–99)
GLUCOSE-CAPILLARY: 116 mg/dL — AB (ref 70–99)
GLUCOSE-CAPILLARY: 118 mg/dL — AB (ref 70–99)
GLUCOSE-CAPILLARY: 119 mg/dL — AB (ref 70–99)
Glucose-Capillary: 103 mg/dL — ABNORMAL HIGH (ref 70–99)
Glucose-Capillary: 113 mg/dL — ABNORMAL HIGH (ref 70–99)
Glucose-Capillary: 116 mg/dL — ABNORMAL HIGH (ref 70–99)
Glucose-Capillary: 116 mg/dL — ABNORMAL HIGH (ref 70–99)
Glucose-Capillary: 119 mg/dL — ABNORMAL HIGH (ref 70–99)
Glucose-Capillary: 120 mg/dL — ABNORMAL HIGH (ref 70–99)
Glucose-Capillary: 121 mg/dL — ABNORMAL HIGH (ref 70–99)
Glucose-Capillary: 127 mg/dL — ABNORMAL HIGH (ref 70–99)
Glucose-Capillary: 98 mg/dL (ref 70–99)

## 2014-03-19 LAB — BASIC METABOLIC PANEL
BUN: 18 mg/dL (ref 6–23)
CALCIUM: 8 mg/dL — AB (ref 8.4–10.5)
CO2: 25 mEq/L (ref 19–32)
CREATININE: 1.03 mg/dL (ref 0.50–1.35)
Chloride: 102 mEq/L (ref 96–112)
GFR calc Af Amer: >90 mL/min (ref >90)
GFR, EST NON AFRICAN AMERICAN: 84 mL/min — AB (ref >90)
GLUCOSE: 126 mg/dL — AB (ref 70–99)
Potassium: 4.8 mEq/L (ref 3.7–5.3)
SODIUM: 138 meq/L (ref 137–147)

## 2014-03-19 LAB — POCT I-STAT, CHEM 8
BUN: 18 mg/dL (ref 6–23)
CALCIUM ION: 1.29 mmol/L — AB (ref 1.12–1.23)
CREATININE: 1.2 mg/dL (ref 0.50–1.35)
Chloride: 100 mEq/L (ref 96–112)
Glucose, Bld: 128 mg/dL — ABNORMAL HIGH (ref 70–99)
HEMATOCRIT: 31 % — AB (ref 39.0–52.0)
HEMOGLOBIN: 10.5 g/dL — AB (ref 13.0–17.0)
Potassium: 4.5 mEq/L (ref 3.7–5.3)
Sodium: 139 mEq/L (ref 137–147)
TCO2: 26 mmol/L (ref 0–100)

## 2014-03-19 LAB — CBC
HCT: 32.2 % — ABNORMAL LOW (ref 39.0–52.0)
HEMATOCRIT: 32.9 % — AB (ref 39.0–52.0)
HEMOGLOBIN: 11.3 g/dL — AB (ref 13.0–17.0)
HEMOGLOBIN: 11.3 g/dL — AB (ref 13.0–17.0)
MCH: 30.9 pg (ref 26.0–34.0)
MCH: 31 pg (ref 26.0–34.0)
MCHC: 35.1 g/dL (ref 30.0–36.0)
MCHC: 34.3 g/dL (ref 30.0–36.0)
MCV: 88 fL (ref 78.0–100.0)
MCV: 90.1 fL (ref 78.0–100.0)
Platelets: 178 10*3/uL (ref 150–400)
Platelets: 169 10*3/uL (ref 150–400)
RBC: 3.66 MIL/uL — ABNORMAL LOW (ref 4.22–5.81)
RBC: 3.65 MIL/uL — ABNORMAL LOW (ref 4.22–5.81)
RDW: 12.6 % (ref 11.5–15.5)
RDW: 12.8 % (ref 11.5–15.5)
WBC: 11.7 10*3/uL — ABNORMAL HIGH (ref 4.0–10.5)
WBC: 10.2 10*3/uL (ref 4.0–10.5)

## 2014-03-19 LAB — CREATININE, SERUM
CREATININE: 1.04 mg/dL (ref 0.50–1.35)
GFR calc Af Amer: >90 mL/min (ref >90)
GFR calc non Af Amer: 83 mL/min — ABNORMAL LOW (ref >90)

## 2014-03-19 LAB — MAGNESIUM
MAGNESIUM: 2.3 mg/dL (ref 1.5–2.5)
Magnesium: 2.3 mg/dL (ref 1.5–2.5)

## 2014-03-19 MED ORDER — ENOXAPARIN SODIUM 40 MG/0.4ML ~~LOC~~ SOLN
40.0000 mg | Freq: Every day | SUBCUTANEOUS | Status: DC
  Administered 2014-03-19: 40 mg via SUBCUTANEOUS
  Filled 2014-03-19 (×2): qty 0.4

## 2014-03-19 MED ORDER — INSULIN ASPART 100 UNIT/ML ~~LOC~~ SOLN
0.0000 [IU] | SUBCUTANEOUS | Status: DC
  Administered 2014-03-19: 2 [IU] via SUBCUTANEOUS

## 2014-03-19 MED FILL — Heparin Sodium (Porcine) Inj 1000 Unit/ML: INTRAMUSCULAR | Qty: 30 | Status: AC

## 2014-03-19 MED FILL — Potassium Chloride Inj 2 mEq/ML: INTRAVENOUS | Qty: 40 | Status: AC

## 2014-03-19 MED FILL — Magnesium Sulfate Inj 50%: INTRAMUSCULAR | Qty: 10 | Status: AC

## 2014-03-19 NOTE — Progress Notes (Addendum)
[facility] DAILY ICU PROGRESS NOTE                   [address]            [place] [address]          [phone]   1 Day Post-Op Procedure(s) (LRB): CORONARY ARTERY BYPASS GRAFTING (CABG) x 4 using endoscopically harvested right saphenous vein and left internal mammary artery (N/A) INTRAOPERATIVE TRANSESOPHAGEAL ECHOCARDIOGRAM (N/A)  Total Length of Stay:  LOS: 5 days   Subjective: Moderate soreness, overall feels pretty well  Objective: Vital signs in last 24 hours: Temp:  [96.6 F (35.9 C)-99.5 F (37.5 C)] 97.3 F (36.3 C) (03/25 0700) Pulse Rate:  [79-93] 81 (03/25 0700) Cardiac Rhythm:  [-] Atrial paced (03/25 0700) Resp:  [10-40] 11 (03/25 0700) BP: (85-112)/(53-80) 96/54 mmHg (03/25 0700) SpO2:  [94 %-100 %] 97 % (03/25 0700) Arterial Line BP: (83-133)/(47-78) 98/51 mmHg (03/25 0700) FiO2 (%):  [40 %-50 %] 40 % (03/24 1619) Weight:  [229 lb 9.6 oz (104.146 kg)] 229 lb 9.6 oz (104.146 kg) (03/25 0422)  Filed Weights   03/14/14 0933 03/19/14 0422  Weight: 219 lb (99.338 kg) 229 lb 9.6 oz (104.146 kg)    Weight change:    Hemodynamic parameters for last 24 hours: PAP: (15-48)/(7-30) 32/16 mmHg CO:  [3.8 L/min-6.8 L/min] 5.4 L/min CI:  [1.8 L/min/m2-3.2 L/min/m2] 2.6 L/min/m2  Intake/Output from previous day: 03/24 0701 - 03/25 0700 In: 6753.5 [I.V.:3553.5; Blood:600; IV Piggyback:2600] Out: 38 [Urine:2410; Emesis/NG output:100; Blood:1200; Chest Tube:550]  Intake/Output this shift: Total I/O In: 73.8 [I.V.:73.8] Out: 75 [Urine:25; Chest Tube:50]  Current Meds: Scheduled Meds: . acetaminophen  1,000 mg Oral 4 times per day   Or  . acetaminophen (TYLENOL) oral liquid 160 mg/5 mL  1,000 mg Per Tube 4 times per day  . aspirin EC  325 mg Oral Daily   Or  . aspirin  324 mg Per Tube Daily  . atorvastatin  80 mg Oral QHS  . bisacodyl  10 mg Oral Daily   Or  . bisacodyl  10 mg Rectal Daily  . buPROPion  300 mg Oral Daily  . cefUROXime  (ZINACEF)  IV  1.5 g Intravenous Q12H  . docusate sodium  200 mg Oral Daily  . ezetimibe  10 mg Oral Q24H  . famotidine (PEPCID) IV  20 mg Intravenous Q12H  . insulin regular  0-10 Units Intravenous TID WC  . metoCLOPramide (REGLAN) injection  10 mg Intravenous 4 times per day  . metoprolol tartrate  12.5 mg Oral BID   Or  . metoprolol tartrate  12.5 mg Per Tube BID  . [START ON 03/20/2014] pantoprazole  40 mg Oral Daily  . sodium chloride  3 mL Intravenous Q12H   Continuous Infusions: . sodium chloride 20 mL/hr at 03/18/14 1800  . sodium chloride 20 mL/hr at 03/18/14 1800  . sodium chloride    . dexmedetomidine Stopped (03/19/14 0300)  . insulin (NOVOLIN-R) infusion Stopped (03/19/14 0800)  . lactated ringers 20 mL/hr at 03/18/14 1800  . nitroGLYCERIN Stopped (03/18/14 1330)  . phenylephrine (NEO-SYNEPHRINE) Adult infusion 20 mcg/min (03/19/14 0800)   PRN Meds:.diazepam, ketorolac, metoprolol, midazolam, morphine injection, ondansetron (ZOFRAN) IV, oxyCODONE, potassium chloride, sodium chloride  General appearance: alert, cooperative and no distress Neurologic: intact Heart: impulse: normal, S1, S2 normal and + rub with chest tubes in place Lungs: mildly dim in bases Abdomen: benign Extremities: + mild edema Wound: dressings CDI  Lab Results: CBC: Recent Labs  03/18/14 1800 03/18/14 1835 03/19/14 0400  WBC 17.0*  --  11.7*  HGB 11.8* 11.6* 11.3*  HCT 33.9* 34.0* 32.2*  PLT 155  --  178   BMET:  Recent Labs  03/18/14 0510  03/18/14 1835 03/19/14 0400  NA 141  < > 142 138  K 4.1  < > 4.4 4.8  CL 102  --  104 102  CO2 26  --   --  25  GLUCOSE 83  < > 118* 126*  BUN 21  --  17 18  CREATININE 1.26  < > 1.20 1.03  CALCIUM 9.4  --   --  8.0*  < > = values in this interval not displayed.  PT/INR:  Recent Labs  03/18/14 1308  LABPROT 16.5*  INR 1.37   Radiology: Dg Chest Portable 1 View  03/18/2014   CLINICAL DATA:  Postop  EXAM: PORTABLE CHEST - 1 VIEW   COMPARISON:  Prior radiograph from 03/13/2014  FINDINGS: Median sternotomy wires are now seen. Right IJ approach Swan-Ganz catheter is in place with tip overlying the proximal right main pulmonary artery. Endotracheal tube is positioned approximately 4.9 cm above the carina. Two left-sided chest tubes are in place. Mediastinal drain in place. Enteric tube courses into the abdomen.  Lungs are normally inflated. No pulmonary edema. Blunting of the left costophrenic angle likely reflects a small left pleural effusion. Patchy left basilar opacity likely reflects atelectasis. No definite left-sided pneumothorax. Right lung is clear.  No acute osseous abnormality.  IMPRESSION: 1. Tip of the endotracheal tube approximately 4.9 cm above the carina. Remaining support apparatus in good position. 2. Small left pleural effusion with mild left basilar atelectasis.   Electronically Signed   By: Jeannine Boga M.D.   On: 03/18/2014 13:45     Assessment/Plan: S/P Procedure(s) (LRB): CORONARY ARTERY BYPASS GRAFTING (CABG) x 4 using endoscopically harvested right saphenous vein and left internal mammary artery (N/A) INTRAOPERATIVE TRANSESOPHAGEAL ECHOCARDIOGRAM (N/A)  1 doing well 2 d/c lines and tubes 3 transition to SSI from gtt 4 H/H mild expected ABL anemia 5 good UO, renal fxn normal 6 will wean neo off 7 orders entered for POD#1 progression  GOLD,WAYNE E 03/19/2014 8:16 AM  Chart reviewed, patient examined, agree with above. With his severity of disease he may benefit from aspirin and plavix but will leave that decision up to cardiology.

## 2014-03-19 NOTE — Progress Notes (Signed)
EKG CRITICAL VALUE     12 lead EKG performed.  Critical value noted.  Wayne Both, RN notified.   Laurell Josephs, [place] 03/19/2014 7:25 AM

## 2014-03-19 NOTE — Progress Notes (Signed)
Subjective:  Day 1 s/p CABG x4; c/o chest soreness  Objective:   Vital Signs in the last 24 hours: Temp:  [96.6 F (35.9 C)-99.5 F (37.5 C)] 97.3 F (36.3 C) (03/25 0700) Pulse Rate:  [79-93] 81 (03/25 0700) Resp:  [10-40] 11 (03/25 0700) BP: (85-112)/(53-80) 96/54 mmHg (03/25 0700) SpO2:  [94 %-100 %] 97 % (03/25 0700) Arterial Line BP: (83-133)/(47-78) 98/51 mmHg (03/25 0700) FiO2 (%):  [40 %-50 %] 40 % (03/24 1619) Weight:  [229 lb 9.6 oz (104.146 kg)] 229 lb 9.6 oz (104.146 kg) (03/25 0422)  Intake/Output from previous day: 03/24 0701 - 03/25 0700 In: 6753.5 [I.V.:3553.5; Blood:600; IV Piggyback:2600] Out: 22 [Urine:2410; Emesis/NG output:100; Blood:1200; Chest Tube:550]  Medications: . acetaminophen  1,000 mg Oral 4 times per day   Or  . acetaminophen (TYLENOL) oral liquid 160 mg/5 mL  1,000 mg Per Tube 4 times per day  . aspirin EC  325 mg Oral Daily   Or  . aspirin  324 mg Per Tube Daily  . atorvastatin  80 mg Oral QHS  . bisacodyl  10 mg Oral Daily   Or  . bisacodyl  10 mg Rectal Daily  . buPROPion  300 mg Oral Daily  . cefUROXime (ZINACEF)  IV  1.5 g Intravenous Q12H  . docusate sodium  200 mg Oral Daily  . ezetimibe  10 mg Oral Q24H  . famotidine (PEPCID) IV  20 mg Intravenous Q12H  . insulin regular  0-10 Units Intravenous TID WC  . metoCLOPramide (REGLAN) injection  10 mg Intravenous 4 times per day  . metoprolol tartrate  12.5 mg Oral BID   Or  . metoprolol tartrate  12.5 mg Per Tube BID  . [START ON 03/20/2014] pantoprazole  40 mg Oral Daily  . sodium chloride  3 mL Intravenous Q12H    . sodium chloride 20 mL/hr at 03/18/14 1800  . sodium chloride 20 mL/hr at 03/18/14 1800  . sodium chloride    . dexmedetomidine Stopped (03/19/14 0300)  . insulin (NOVOLIN-R) infusion Stopped (03/19/14 0800)  . lactated ringers 20 mL/hr at 03/18/14 1800  . nitroGLYCERIN Stopped (03/18/14 1330)  . phenylephrine (NEO-SYNEPHRINE) Adult infusion 20 mcg/min  (03/19/14 0800)    Physical Exam:   General appearance: alert, cooperative and no distress Neck: no carotid bruit, no JVD, supple, symmetrical, trachea midline and thyroid not enlarged, symmetric, no tenderness/mass/nodules Lungs: decreased BS Heart: S1, S2 normal and friction rub heard 3 component Abdomen: soft, non-tender; bowel sounds normal; no masses,  no organomegaly Extremities: no edema, redness or tenderness in the calves or thighs Pulses: 2+ and symmetric Skin: Skin color, texture, turgor normal. No rashes or lesions Neurologic: Grossly normal   Rate: 83  Rhythm: normal sinus rhythm  Lab Results:    Recent Labs  03/18/14 0510  03/18/14 1835 03/19/14 0400  NA 141  < > 142 138  K 4.1  < > 4.4 4.8  CL 102  --  104 102  CO2 26  --   --  25  GLUCOSE 83  < > 118* 126*  BUN 21  --  17 18  CREATININE 1.26  < > 1.20 1.03  < > = values in this interval not displayed. No results found for this basename: TROPONINI, CK, MB,  in the last 72 hours Hepatic Function Panel No results found for this basename: PROT, ALBUMIN, AST, ALT, ALKPHOS, BILITOT, BILIDIR, IBILI,  in the last 72 hours  Recent Labs  03/18/14  1308  INR 1.37   BNP (last 3 results) No results found for this basename: PROBNP,  in the last 8760 hours  Lipid Panel     Component Value Date/Time   CHOL 225* 03/13/2014 1100   TRIG 112 03/13/2014 1100   TRIG 102 01/28/2014 0930   HDL 41 03/13/2014 1100   CHOLHDL 5.5 03/13/2014 1100   VLDL 22 03/13/2014 1100   LDLCALC 162* 03/13/2014 1100   LDLCALC 256* 01/28/2014 0930      Imaging:  Dg Chest Portable 1 View  03/18/2014   CLINICAL DATA:  Postop  EXAM: PORTABLE CHEST - 1 VIEW  COMPARISON:  Prior radiograph from 03/13/2014  FINDINGS: Median sternotomy wires are now seen. Right IJ approach Swan-Ganz catheter is in place with tip overlying the proximal right main pulmonary artery. Endotracheal tube is positioned approximately 4.9 cm above the carina. Two left-sided  chest tubes are in place. Mediastinal drain in place. Enteric tube courses into the abdomen.  Lungs are normally inflated. No pulmonary edema. Blunting of the left costophrenic angle likely reflects a small left pleural effusion. Patchy left basilar opacity likely reflects atelectasis. No definite left-sided pneumothorax. Right lung is clear.  No acute osseous abnormality.  IMPRESSION: 1. Tip of the endotracheal tube approximately 4.9 cm above the carina. Remaining support apparatus in good position. 2. Small left pleural effusion with mild left basilar atelectasis.   Electronically Signed   By: Jeannine Boga M.D.   On: 03/18/2014 13:45      Assessment/Plan:   Principal Problem:   Unstable angina Active Problems:   HTN (hypertension)   Syndrome X, cardiac- prior cath '05, 2010   Dyslipidemia   Obesity (BMI 30-39.9)   Abnormal nuclear stress test   Coronary artery disease-3 V CAD at cath   S/P CABG x 4   Day 1 s/p CABG x 4. Distal anastomosis were tedious secondary to severity of disease and caliper of distal vessels. I/O +4757 since admission. Neo-synephrine being weaned as BP allows. Stable rhythm. Soft friction rub. Diurese as BP allows.   Troy Sine, MD, [facility] 03/19/2014, 8:24 AM

## 2014-03-19 NOTE — Progress Notes (Signed)
Patient ID: Kenneth [name], male   DOB: 11-30-1966, 48 y.o.   MRN: [id number] EVENING ROUNDS NOTE :     [address]       ,[place] [address]             [phone]                 1 Day Post-Op Procedure(s) (LRB): CORONARY ARTERY BYPASS GRAFTING (CABG) x 4 using endoscopically harvested right saphenous vein and left internal mammary artery (N/A) INTRAOPERATIVE TRANSESOPHAGEAL ECHOCARDIOGRAM (N/A)  Total Length of Stay:  LOS: 5 days  BP 104/60  Pulse 86  Temp(Src) 98.5 F (36.9 C) (Oral)  Resp 24  Ht 5\' 9"  (1.753 m)  Wt 229 lb 9.6 oz (104.146 kg)  BMI 33.89 kg/m2  SpO2 91%  .Intake/Output     03/24 0701 - 03/25 0700 03/25 0701 - 03/26 0700   P.O.     I.V. (mL/kg) 3553.5 (34.1) 241.3 (2.3)   Blood 600    IV Piggyback 2600    Total Intake(mL/kg) 6753.5 (64.8) 241.3 (2.3)   Urine (mL/kg/hr) 2410 (1) 195 (0.2)   Emesis/NG output 100 (0)    Blood 1200 (0.5)    Chest Tube 550 (0.2) 200 (0.2)   Total Output 4260 395   Net +2493.5 -153.7          . sodium chloride 20 mL/hr at 03/18/14 1800  . sodium chloride 20 mL/hr at 03/18/14 1800  . sodium chloride    . dexmedetomidine Stopped (03/19/14 0300)  . lactated ringers 20 mL/hr at 03/18/14 1800  . nitroGLYCERIN Stopped (03/18/14 1330)  . phenylephrine (NEO-SYNEPHRINE) Adult infusion Stopped (03/19/14 0900)     Lab Results  Component Value Date   WBC 11.7* 03/19/2014   HGB 11.3* 03/19/2014   HCT 32.2* 03/19/2014   PLT 178 03/19/2014   GLUCOSE 126* 03/19/2014   CHOL 225* 03/13/2014   TRIG 112 03/13/2014   HDL 41 03/13/2014   LDLCALC 162* 03/13/2014   ALT 16 03/13/2014   AST 16 03/13/2014   NA 138 03/19/2014   K 4.8 03/19/2014   CL 102 03/19/2014   CREATININE 1.03 03/19/2014   BUN 18 03/19/2014   CO2 25 03/19/2014   TSH 1.511 03/13/2014   PSA 0.96 01/28/2014   INR 1.37 03/18/2014   HGBA1C 5.5 03/18/2014   Stable day Walked 2 times around unit  Grace Isaac MD  Beeper [phone] Office  [phone] 03/19/2014 4:38 PM

## 2014-03-19 NOTE — Plan of Care (Signed)
Problem: Phase II - Intermediate Post-Op Goal: Wean to Extubate Outcome: Completed/Met Date Met:  03/19/14 With-in 6 hours

## 2014-03-20 ENCOUNTER — Inpatient Hospital Stay (HOSPITAL_COMMUNITY): Payer: Managed Care, Other (non HMO)

## 2014-03-20 DIAGNOSIS — J95811 Postprocedural pneumothorax: Secondary | ICD-10-CM

## 2014-03-20 LAB — GLUCOSE, CAPILLARY
GLUCOSE-CAPILLARY: 104 mg/dL — AB (ref 70–99)
GLUCOSE-CAPILLARY: 120 mg/dL — AB (ref 70–99)
Glucose-Capillary: 118 mg/dL — ABNORMAL HIGH (ref 70–99)

## 2014-03-20 LAB — CBC
HCT: 32.3 % — ABNORMAL LOW (ref 39.0–52.0)
HEMOGLOBIN: 11 g/dL — AB (ref 13.0–17.0)
MCH: 30.6 pg (ref 26.0–34.0)
MCHC: 34.1 g/dL (ref 30.0–36.0)
MCV: 90 fL (ref 78.0–100.0)
Platelets: 167 10*3/uL (ref 150–400)
RBC: 3.59 MIL/uL — ABNORMAL LOW (ref 4.22–5.81)
RDW: 12.7 % (ref 11.5–15.5)
WBC: 9.8 10*3/uL (ref 4.0–10.5)

## 2014-03-20 LAB — BASIC METABOLIC PANEL
BUN: 15 mg/dL (ref 6–23)
CO2: 25 mEq/L (ref 19–32)
CREATININE: 0.96 mg/dL (ref 0.50–1.35)
Calcium: 8.7 mg/dL (ref 8.4–10.5)
Chloride: 102 mEq/L (ref 96–112)
GFR calc non Af Amer: >90 mL/min (ref >90)
GLUCOSE: 119 mg/dL — AB (ref 70–99)
POTASSIUM: 4.6 meq/L (ref 3.7–5.3)
Sodium: 139 mEq/L (ref 137–147)

## 2014-03-20 MED ORDER — OXYCODONE HCL 5 MG PO TABS
5.0000 mg | ORAL_TABLET | ORAL | Status: DC | PRN
  Administered 2014-03-20 – 2014-03-24 (×19): 10 mg via ORAL
  Filled 2014-03-20 (×19): qty 2

## 2014-03-20 MED ORDER — DOCUSATE SODIUM 100 MG PO CAPS
200.0000 mg | ORAL_CAPSULE | Freq: Every day | ORAL | Status: DC
  Administered 2014-03-21 – 2014-03-24 (×4): 200 mg via ORAL
  Filled 2014-03-20 (×4): qty 2

## 2014-03-20 MED ORDER — MORPHINE SULFATE 2 MG/ML IJ SOLN
2.0000 mg | INTRAMUSCULAR | Status: DC | PRN
  Administered 2014-03-20 (×2): 2 mg via INTRAVENOUS
  Administered 2014-03-20 – 2014-03-21 (×2): 4 mg via INTRAVENOUS
  Administered 2014-03-23: 2 mg via INTRAVENOUS
  Filled 2014-03-20: qty 2
  Filled 2014-03-20 (×2): qty 1
  Filled 2014-03-20: qty 2

## 2014-03-20 MED ORDER — SODIUM CHLORIDE 0.9 % IJ SOLN: 3.0000 mL | INTRAMUSCULAR | Status: DC | PRN

## 2014-03-20 MED ORDER — FUROSEMIDE 10 MG/ML IJ SOLN
INTRAMUSCULAR | Status: AC
  Filled 2014-03-20: qty 4

## 2014-03-20 MED ORDER — FUROSEMIDE 40 MG PO TABS
40.0000 mg | ORAL_TABLET | Freq: Every day | ORAL | Status: AC
  Administered 2014-03-21 – 2014-03-23 (×3): 40 mg via ORAL
  Filled 2014-03-20 (×3): qty 1

## 2014-03-20 MED ORDER — BISACODYL 10 MG RE SUPP: 10.0000 mg | Freq: Every day | RECTAL | Status: DC | PRN

## 2014-03-20 MED ORDER — ACETAMINOPHEN 325 MG PO TABS: 650.0000 mg | ORAL_TABLET | Freq: Four times a day (QID) | ORAL | Status: DC | PRN

## 2014-03-20 MED ORDER — PANTOPRAZOLE SODIUM 40 MG PO TBEC
40.0000 mg | DELAYED_RELEASE_TABLET | Freq: Every day | ORAL | Status: DC
  Administered 2014-03-21 – 2014-03-24 (×4): 40 mg via ORAL
  Filled 2014-03-20 (×4): qty 1

## 2014-03-20 MED ORDER — ONDANSETRON HCL 4 MG PO TABS
4.0000 mg | ORAL_TABLET | Freq: Four times a day (QID) | ORAL | Status: DC | PRN
Start: 2014-03-20 — End: 2014-03-24

## 2014-03-20 MED ORDER — TRAMADOL HCL 50 MG PO TABS
50.0000 mg | ORAL_TABLET | ORAL | Status: DC | PRN
  Administered 2014-03-20 – 2014-03-24 (×13): 100 mg via ORAL
  Filled 2014-03-20 (×14): qty 2

## 2014-03-20 MED ORDER — NITROGLYCERIN IN D5W 200-5 MCG/ML-% IV SOLN: 2.0000 ug/min | INTRAVENOUS | Status: DC

## 2014-03-20 MED ORDER — BISACODYL 5 MG PO TBEC
10.0000 mg | DELAYED_RELEASE_TABLET | Freq: Every day | ORAL | Status: DC | PRN
  Filled 2014-03-20: qty 2

## 2014-03-20 MED ORDER — METOPROLOL TARTRATE 25 MG PO TABS
25.0000 mg | ORAL_TABLET | Freq: Two times a day (BID) | ORAL | Status: DC
  Administered 2014-03-20 – 2014-03-24 (×7): 25 mg via ORAL
  Filled 2014-03-20 (×9): qty 1

## 2014-03-20 MED ORDER — ONDANSETRON HCL 4 MG/2ML IJ SOLN: 4.0000 mg | Freq: Four times a day (QID) | INTRAMUSCULAR | Status: DC | PRN

## 2014-03-20 MED ORDER — MIDAZOLAM HCL 2 MG/2ML IJ SOLN
INTRAMUSCULAR | Status: AC
  Filled 2014-03-20: qty 2

## 2014-03-20 MED ORDER — ENOXAPARIN SODIUM 40 MG/0.4ML ~~LOC~~ SOLN
40.0000 mg | SUBCUTANEOUS | Status: DC
  Administered 2014-03-20 – 2014-03-23 (×4): 40 mg via SUBCUTANEOUS
  Filled 2014-03-20 (×5): qty 0.4

## 2014-03-20 MED ORDER — MIDAZOLAM HCL 2 MG/2ML IJ SOLN: 2.0000 mg | Freq: Once | INTRAMUSCULAR | Status: DC

## 2014-03-20 MED ORDER — MOVING RIGHT ALONG BOOK
Freq: Once | Status: DC
  Filled 2014-03-20: qty 1

## 2014-03-20 MED ORDER — SODIUM CHLORIDE 0.9 % IJ SOLN
3.0000 mL | Freq: Two times a day (BID) | INTRAMUSCULAR | Status: DC
  Administered 2014-03-20 – 2014-03-23 (×6): 3 mL via INTRAVENOUS

## 2014-03-20 MED ORDER — FUROSEMIDE 10 MG/ML IJ SOLN
40.0000 mg | Freq: Once | INTRAMUSCULAR | Status: AC
  Administered 2014-03-20: 40 mg via INTRAVENOUS

## 2014-03-20 MED ORDER — ASPIRIN EC 325 MG PO TBEC
325.0000 mg | DELAYED_RELEASE_TABLET | Freq: Every day | ORAL | Status: DC
  Administered 2014-03-21 – 2014-03-24 (×4): 325 mg via ORAL
  Filled 2014-03-20 (×4): qty 1

## 2014-03-20 MED ORDER — POTASSIUM CHLORIDE CRYS ER 20 MEQ PO TBCR
20.0000 meq | EXTENDED_RELEASE_TABLET | Freq: Two times a day (BID) | ORAL | Status: AC
Start: 2014-03-20 — End: 2014-03-23
  Administered 2014-03-20 – 2014-03-22 (×5): 20 meq via ORAL
  Filled 2014-03-20 (×5): qty 1

## 2014-03-20 MED ORDER — SODIUM CHLORIDE 0.9 % IV SOLN: 250.0000 mL | INTRAVENOUS | Status: DC | PRN

## 2014-03-20 NOTE — Progress Notes (Signed)
Radiologist called RN about patient presenting with 50% LEFT Pneumothorax, Dr. Cyndia Bent was on the unit and at bedside, made aware of CXR results.  MD did inform patient. Orders:  DG PORT CHEST 1 VIEW at 1200.   Will monitor patient, VS stable.

## 2014-03-20 NOTE — CV Procedure (Signed)
Chest Tube Insertion Procedure Note  Indications:  Clinically significant Pneumothorax  Pre-operative Diagnosis: Left Pneumothorax  Post-operative Diagnosis: Left Pneumothorax  Procedure Details  Informed consent was obtained for the procedure, including sedation.  Risks of lung perforation, hemorrhage, arrhythmia, and adverse drug reaction were discussed.   After sterile skin prep, using standard technique, a 20 French tube was placed in the right anterior 5th rib space.  Findings: None  Estimated Blood Loss:  Minimal         Specimens:  None              Complications:  None; patient tolerated the procedure well.         Disposition: patient already in SICU         Condition: stable  Attending Attestation: I performed the procedure.

## 2014-03-20 NOTE — Progress Notes (Signed)
[facility] BRIEF SICU PROGRESS NOTE  2 Days Post-Op  S/P Procedure(s) (LRB): CORONARY ARTERY BYPASS GRAFTING (CABG) x 4 using endoscopically harvested right saphenous vein and left internal mammary artery (N/A) INTRAOPERATIVE TRANSESOPHAGEAL ECHOCARDIOGRAM (N/A)   Sore in chest NSR w/ stable BP O2 sats 95% on 4 L/min  Plan: Continue current plan.  Receiving IV toradol for added pain relief  [name],Kenneth H 03/20/2014 8:16 PM

## 2014-03-20 NOTE — Progress Notes (Addendum)
2 Days Post-Op Procedure(s) (LRB): CORONARY ARTERY BYPASS GRAFTING (CABG) x 4 using endoscopically harvested right saphenous vein and left internal mammary artery (N/A) INTRAOPERATIVE TRANSESOPHAGEAL ECHOCARDIOGRAM (N/A) Subjective: No specific complaints  Objective: Vital signs in last 24 hours: Temp:  [97.9 F (36.6 C)-98.8 F (37.1 C)] 98.6 F (37 C) (03/26 0720) Pulse Rate:  [73-105] 96 (03/26 0700) Cardiac Rhythm:  [-] Normal sinus rhythm (03/26 0700) Resp:  [14-31] 15 (03/26 0700) BP: (92-141)/(55-76) 101/65 mmHg (03/26 0700) SpO2:  [90 %-100 %] 92 % (03/26 0700) Arterial Line BP: (117-129)/(57-58) 129/57 mmHg (03/25 0900) Weight:  [103.6 kg (228 lb 6.3 oz)] 103.6 kg (228 lb 6.3 oz) (03/26 0411)  Hemodynamic parameters for last 24 hours: PAP: (30-31)/(13-14) 30/13 mmHg CO:  [6.6 L/min-6.8 L/min] 6.8 L/min CI:  [3.1 L/min/m2-3.2 L/min/m2] 3.2 L/min/m2  Intake/Output from previous day: 03/25 0701 - 03/26 0700 In: 641.3 [I.V.:541.3; IV Piggyback:100] Out: 1270 [Urine:1070; Chest Tube:200] Intake/Output this shift:    General appearance: alert and cooperative Heart: regular rate and rhythm, S1, S2 normal, no murmur, click, rub or gallop Lungs: clear to auscultation bilaterally Extremities: edema mild Wound: dressings dry  Lab Results:  Recent Labs  03/19/14 1700 03/19/14 1711 03/20/14 0400  WBC 10.2  --  9.8  HGB 11.3* 10.5* 11.0*  HCT 32.9* 31.0* 32.3*  PLT 169  --  167   BMET:  Recent Labs  03/19/14 0400  03/19/14 1711 03/20/14 0400  NA 138  --  139 139  K 4.8  --  4.5 4.6  CL 102  --  100 102  CO2 25  --   --  25  GLUCOSE 126*  --  128* 119*  BUN 18  --  18 15  CREATININE 1.03  < > 1.20 0.96  CALCIUM 8.0*  --   --  8.7  < > = values in this interval not displayed.  PT/INR:  Recent Labs  03/18/14 1308  LABPROT 16.5*  INR 1.37   ABG    Component Value Date/Time   PHART 7.357 03/18/2014 1754   HCO3 30.2* 03/18/2014 1754   TCO2 26 03/19/2014  1711   ACIDBASEDEF 1.0 03/18/2014 1320   O2SAT 94.0 03/18/2014 1754   CBG (last 3)   Recent Labs  03/19/14 1918 03/19/14 2332 03/20/14 0334  GLUCAP 113* 103* 104*   CXR: moderate left ptx new since yesterday  Assessment/Plan: S/P Procedure(s) (LRB): CORONARY ARTERY BYPASS GRAFTING (CABG) x 4 using endoscopically harvested right saphenous vein and left internal mammary artery (N/A) INTRAOPERATIVE TRANSESOPHAGEAL ECHOCARDIOGRAM (N/A) Mobilize Diuresis Plan for transfer to step-down: see transfer orders   LOS: 6 days    Kenneth [name] K 03/20/2014  Addendum: CXR this am shows moderate left ptx. His breath sounds are fairly good on the left side. Will watch him and repeat the CXR at noon to see if it its getting worse. Keep in the ICU for now.

## 2014-03-20 NOTE — Progress Notes (Signed)
Radiologist called RN about patient's still presenting with  LEFT PNEUMOTHORAX (70%), Paged Dr. Cyndia Bent in [place], [place] RN updated Dr. Cyndia Bent.  Orders:  Prep for Chest Tube placement at bedside.  VS stable, will monitor.

## 2014-03-21 ENCOUNTER — Ambulatory Visit: Payer: Managed Care, Other (non HMO) | Admitting: Internal Medicine

## 2014-03-21 ENCOUNTER — Inpatient Hospital Stay (HOSPITAL_COMMUNITY): Payer: Managed Care, Other (non HMO)

## 2014-03-21 ENCOUNTER — Encounter (HOSPITAL_COMMUNITY): Payer: Self-pay | Admitting: Surgery

## 2014-03-21 DIAGNOSIS — R9439 Abnormal result of other cardiovascular function study: Secondary | ICD-10-CM

## 2014-03-21 MED ORDER — LISINOPRIL 10 MG PO TABS
10.0000 mg | ORAL_TABLET | Freq: Every day | ORAL | Status: DC
  Administered 2014-03-22 – 2014-03-24 (×2): 10 mg via ORAL
  Filled 2014-03-21 (×4): qty 1

## 2014-03-21 NOTE — Progress Notes (Signed)
Right IJ Cordis removed at 0000 once patient back in bed. Pt laid flat for procedure, and pressure held to site for 10 minutes after removal. Vitals stable and pt comfortable. Pt on bedrest now. Will continue to monitor.

## 2014-03-21 NOTE — Discharge Summary (Signed)
Physician Discharge Summary       [address]       [place], [address]             [phone]    Patient ID: Kenneth [name] MRN: [id number] DOB/AGE: 07/27/1966 48 y.o.  Admit date: 03/14/2014 Discharge date: 03/23/2014  Admission Diagnoses: 1. Multivessel CAD 2. History of syndrome X 3.History of hypertension 4. History of hyperlipidemia 5. History of tobacco abuse  Discharge Diagnoses:  1. Multivessel CAD 2. History of syndrome X 3.History of hypertension 4. History of hyperlipidemia 5. History of tobacco abuse 6. Mild ABl anemia   Procedure (s):  1. Cardiac catheterization done by Dr. Claiborne Billings on 03/14/2014: ANGIOGRAPHY:  The left main coronary artery was angiographically normal and trifurcated into an LAD, a ramus intermediate vessel, and left circumflex coronary artery.  The LAD gave rise to a proximal septal perforating artery and then had a 60-70% stenosis beyond a septal vessel. There was an additional 50% mid LAD stenosis. There was significant septal collaterals supplying the distal RCA.  The Ramus Intermediate vessel was angiographically normal.  The left circumflex cardiotomy ice to the proximal marginal vessel. This marginal vessel had 95% stenosis at its origin. It was subtotally stenosed in the distal aspect. The circumflex vessel was totally occluded almost immediately beyond the takeoff of the small marginal vessel. This appeared to be a chronic total occlusion with a long occluded segment. However, there was filling of a distal marginal and distal circumflex via collaterals.  The right coronary artery appeared to have a prior diffuse new subtotal stenosis proximally and there was extensive bridging collateralization and small vessels. There also was an aneurysmal segment with bridging collaterals seen to enter. The distal RCA was totally occluded after the acute margin branch. There were extensive left to right collaterals supplying the distal PDA  and PLA vessel.  Left ventriculography revealed an ejection fraction of approximately 50%. There was mild anterolateral hypocontractility  2.Median Sternotomy, Extracorporeal circulation Coronary artery bypass grafting x 4  Left internal mammary graft to the LAD  SVG to OM  Sequential SVG to PDA and PL Endoscopic vein harvest from the right leg by Dr. Cyndia Bent on 03/18/2014  3.20 French chest tube was placed in the right anterior 5th rib space by Dr. Cyndia Bent on 03/20/2014  History of Presenting Illness: This is a 48 year old gentleman with hypertension and hyperlipidemia and a strong family history of premature coronary disease who reports a history of intermittent chest discomfort since 2001 while living in [place]. He underwent several evaluations both cardiac as well as GI. He underwent a cardiac catheterization at least 5 years previously in [place] and was told of having normal epicardial coronary arteries. He was diagnosed with microvascular angina. He has been treated with nitroglycerin with improvement. He also completed in EECP treatment regimen. Since moving to the [place] area, he had seen Dr.Hilty by his report several years ago and was maintained on medical regimen. He has continued to experience exertional chest pain that seems to have increased over the past year. He has been having chest pain with minimal exertion as well as after eating. He saw Dr. Claiborne Billings on 01/28/2014. At that time he described accelerating chest pain pattern. He was referred for an exercise Myoview study that was high risk for inferior and lateral ischemia with chest pain. Cath today shows severe multi-vessel disease with occlusion of the LCX and RCA with high grade proximal LAD stenosis. There are collat to  the distal RCA and OM. LV function is good. Dr. Cyndia Bent discussed the need for coronary artery bypass grafting surgery. Potential risks, benefits, and complications were discussed with the patient and he agreed to  proceed with surgery. Pre operative carotid duplex US shoed no significant carotid artery stenosis bilaterally. He underwent a CABG x 4 on 03/18/2014.   Brief Hospital Course:  The patient was extubated the evening of surgery without difficulty. He remained afebrile and hemodynamically stable. Was on Neo synephrine initially.Gordy Councilman, a line, chest tubes, and foley were removed early in the post operative course. Lopressor was started and titrated accordingly. He was volume over loaded and diuresed. He was weaned off the insulin drip.The patient's HGA1C pre op was 5.5. Chest x ray then showed a moderate left pneumothorax. He required a left chest tube which was placed on 3/26.The patient was felt surgically stable for transfer from the ICU to [facility] for further convalescence on 03/22/2013. Chest tube had minor output and no air leak. It was placed to water seal on 3/28. CXR 3/29 showed a stable,left pneumothorax. Chest tube was removed 03/23/14.  Follow up CXR showed improved of left apical pneumothorax.He continues to progress with cardiac rehab. He was ambulating on room air. He has been tolerating a diet and has had a bowel movement. Epicardial pacing wires and chest tube sutures will be removed prior to discharge. Provided the patient remains afebrile, hemodynamically stable, and pending morning round evaluation, he will be surgically stable for discharge on 03/24/2013.   Latest Vital Signs: Blood pressure 98/59, pulse 79, temperature 97.9 F (36.6 C), temperature source Oral, resp. rate 18, height 5\' 9"  (1.753 m), weight 216 lb 11.4 oz (98.3 kg), SpO2 93.00%.  Physical Exam: Cardiovascular: RRR, no murmurs, gallops, or rubs.  Pulmonary: Clear to auscultation bilaterally; no rales, wheezes, or rhonchi.  Abdomen: Soft, non tender, bowel sounds present.  Extremities: No lower extremity edema.  Wounds: Clean and dry. No erythema or signs of infection.   Discharge Condition:Stable  Recent  laboratory studies:  Lab Results  Component Value Date   WBC 9.8 03/20/2014   HGB 11.0* 03/20/2014   HCT 32.3* 03/20/2014   MCV 90.0 03/20/2014   PLT 167 03/20/2014   Lab Results  Component Value Date   NA 139 03/20/2014   K 4.6 03/20/2014   CL 102 03/20/2014   CO2 25 03/20/2014   CREATININE 0.96 03/20/2014   GLUCOSE 119* 03/20/2014      Diagnostic Studies: Dg Chest Port 1 View  03/21/2014   CLINICAL DATA:  Reassess chest tubes  EXAM: PORTABLE CHEST - 1 VIEW  COMPARISON:  DG CHEST 1V PORT dated 03/20/2014  FINDINGS: The lungs are hypoinflated. The chest tube on the left has its tip in the pulmonary apex. There is no pneumothorax or significant pleural effusion. There is haziness at the right lung base which may reflect a small amount of fluid or subsegmental atelectasis. The cardiac silhouette is enlarged. The pulmonary vascularity is not engorged. The trachea is midline. The patient has undergone previous median sternotomy. The right internal jugular Cordis sheath has been removed.  IMPRESSION: 1. The lungs are mildly hypoinflated. The left lung base is better demonstrated today. However, haziness at the right lung base has developed suggesting subsegmental atelectasis or a small pleural effusion. 2. The left-sided chest tube is unchanged in position.   Electronically Signed   By: David  Martinique   On: 03/21/2014 08:17         Future  Appointments Provider Department Dept Phone   03/26/2014 1:40 PM Ilean China [facility] [phone]   04/16/2014 1:00 PM Gaye Pollack, MD [facility] [phone]      Discharge Medications:    Medication List    STOP taking these medications       amLODipine 5 MG tablet  Commonly known as:  NORVASC     aspirin 81 MG tablet  Replaced by:  aspirin 325 MG EC tablet     nitroGLYCERIN 0.4 MG SL tablet  Commonly known as:  NITROSTAT     nitroGLYCERIN 0.4 mg/hr patch  Commonly known as:   NITRODUR - Dosed in mg/24 hr     ranolazine 1000 MG SR tablet  Commonly known as:  RANEXA      TAKE these medications       amoxicillin 500 MG capsule  Commonly known as:  AMOXIL  Take 500 mg by mouth every 8 (eight) hours. Started 02/26/14.  14 day course of therapy.  (Pre-medication for dental work)     aspirin 325 MG EC tablet  Take 1 tablet (325 mg total) by mouth daily.     atorvastatin 80 MG tablet  Commonly known as:  LIPITOR  Take 80 mg by mouth at bedtime.     buPROPion 300 MG 24 hr tablet  Commonly known as:  WELLBUTRIN XL  Take 300 mg by mouth daily.     gemfibrozil 600 MG tablet  Commonly known as:  LOPID  Take 600 mg by mouth 2 (two) times daily before a meal.     lisinopril 10 MG tablet  Commonly known as:  PRINIVIL,ZESTRIL  Take 1 tablet (10 mg total) by mouth daily.     metoprolol tartrate 25 MG tablet  Commonly known as:  LOPRESSOR  Take 1 tablet (25 mg total) by mouth 2 (two) times daily.     oxyCODONE 5 MG immediate release tablet  Commonly known as:  Oxy IR/ROXICODONE  Take 1-2 tablets (5-10 mg total) by mouth every 3 (three) hours as needed for severe pain.     TESTOSTERONE IM  Inject 1.5 mLs into the muscle every 7 (seven) days.     traMADol 50 MG tablet  Commonly known as:  ULTRAM  Take 1-2 tablets (50-100 mg total) by mouth every 4 (four) hours as needed for moderate pain.     zolpidem 10 MG tablet  Commonly known as:  AMBIEN  Take 10 mg by mouth at bedtime as needed for sleep.          The patient has been discharged on:   1.Beta Blocker:  Yes [ x  ]                              No   [   ]                              If No, reason:  2.Ace Inhibitor/ARB: Yes [ x  ]                                     No  [    ]  If No, reason:  3.Statin:   Yes [ x  ]                  No  [   ]                  If No, reason:  4.Ecasa:  Yes  [  x ]                  No   [   ]                  If No,  reason:  Follow Up Appointments: Follow-up Information   Follow up with Troy Sine, MD On 03/21/2014. (Appointment time is at)    Specialty:  Cardiology   Contact information:   [address] [place] [address] [phone]       Follow up with Gaye Pollack, MD. (PA/LAT CXR to be taken (at [facility] which is in the same building as Dr. Vivi Martens office) on 04/16/2014 at 12:00 pm;Appointment time is at 1:00 pm)    Specialty:  Cardiothoracic Surgery   Contact information:   [address] [place] [address] [phone]       Signed: Lars Pinks MPA-C 03/23/2014, 9:48 AM

## 2014-03-21 NOTE — Progress Notes (Signed)
Pt ambulated 350 feet pushing wheelchair at this time; pt tolerated ambulation well; pt back to room; wife in room; call bell w/i reach; will cont. To monitor.

## 2014-03-21 NOTE — Progress Notes (Signed)
1505 Offered to walk with pt. Has walked three times today. Will follow up tomorrow. Graylon Good RN BSN 03/21/2014 3:07 PM

## 2014-03-21 NOTE — Progress Notes (Addendum)
[facility] DAILY ICU PROGRESS NOTE                   [address]            [place] [address]          [phone]   3 Days Post-Op Procedure(s) (LRB): CORONARY ARTERY BYPASS GRAFTING (CABG) x 4 using endoscopically harvested right saphenous vein and left internal mammary artery (N/A) INTRAOPERATIVE TRANSESOPHAGEAL ECHOCARDIOGRAM (N/A)  Total Length of Stay:  LOS: 7 days   Subjective: C/o soreness, improving  Objective: Vital signs in last 24 hours: Temp:  [97.9 F (36.6 C)-99.4 F (37.4 C)] 98.5 F (36.9 C) (03/27 0740) Pulse Rate:  [84-104] 84 (03/27 0700) Cardiac Rhythm:  [-] Normal sinus rhythm (03/27 0600) Resp:  [11-34] 12 (03/27 0700) BP: (101-156)/(57-101) 101/66 mmHg (03/27 0700) SpO2:  [92 %-100 %] 95 % (03/27 0700) Weight:  [224 lb 3.3 oz (101.7 kg)] 224 lb 3.3 oz (101.7 kg) (03/27 0300)  Filed Weights   03/19/14 0422 03/20/14 0411 03/21/14 0300  Weight: 229 lb 9.6 oz (104.146 kg) 228 lb 6.3 oz (103.6 kg) 224 lb 3.3 oz (101.7 kg)    Weight change: -4 lb 3 oz (-1.9 kg)   Hemodynamic parameters for last 24 hours:    Intake/Output from previous day: 03/26 0701 - 03/27 0700 In: 220 [P.O.:120; I.V.:100] Out: 1950 [Urine:1550; Chest Tube:400]  Intake/Output this shift:    Current Meds: Scheduled Meds: . aspirin EC  325 mg Oral Daily  . atorvastatin  80 mg Oral QHS  . buPROPion  300 mg Oral Daily  . docusate sodium  200 mg Oral Daily  . enoxaparin (LOVENOX) injection  40 mg Subcutaneous Q24H  . furosemide  40 mg Oral Daily  . metoprolol tartrate  25 mg Oral BID  . midazolam  2 mg Intravenous Once  . moving right along book   Does not apply Once  . pantoprazole  40 mg Oral QAC breakfast  . potassium chloride  20 mEq Oral BID  . sodium chloride  3 mL Intravenous Q12H   Continuous Infusions:  PRN Meds:.sodium chloride, acetaminophen, bisacodyl, bisacodyl, ketorolac, morphine injection, ondansetron (ZOFRAN) IV, ondansetron, oxyCODONE,  sodium chloride, traMADol  General appearance: alert, cooperative and no distress Heart: regular rate and rhythm Lungs: mildly dim in bases Abdomen: benign Extremities: minor edema Wound: dressings CDI  Lab Results: CBC: Recent Labs  03/19/14 1700 03/19/14 1711 03/20/14 0400  WBC 10.2  --  9.8  HGB 11.3* 10.5* 11.0*  HCT 32.9* 31.0* 32.3*  PLT 169  --  167   BMET:  Recent Labs  03/19/14 0400  03/19/14 1711 03/20/14 0400  NA 138  --  139 139  K 4.8  --  4.5 4.6  CL 102  --  100 102  CO2 25  --   --  25  GLUCOSE 126*  --  128* 119*  BUN 18  --  18 15  CREATININE 1.03  < > 1.20 0.96  CALCIUM 8.0*  --   --  8.7  < > = values in this interval not displayed.  PT/INR:  Recent Labs  03/18/14 1308  LABPROT 16.5*  INR 1.37   Radiology: Dg Chest Port 1 View  03/20/2014   CLINICAL DATA:  Chest tube placement.  EXAM: PORTABLE CHEST - 1 VIEW  COMPARISON:  03/20/2014  FINDINGS: Right IJ a central line sheath remains in place, tip overlying the level of superior vena  cava. Two left-sided chest tubes have been placed, now all with left lung re-expansion. There has been significant decrease and the left pneumothorax. Persistent left lung base opacity obscures the hemidiaphragm. Left pleural effusion is present.  IMPRESSION: 1. Interval placement of 2 left-sided chest tubes. 2. Smaller left pneumothorax. 3. Left lung base opacity consistent with atelectasis and/or infiltrate and pleural effusion.   Electronically Signed   By: Shon Hale M.D.   On: 03/20/2014 15:47   Dg Chest Port 1 View  03/20/2014   CLINICAL DATA:  Post CABG, followup pneumothorax  EXAM: PORTABLE CHEST - 1 VIEW  COMPARISON:  03/20/2014  FINDINGS: Cardiomediastinal silhouette is stable. There is progression in left pneumothorax measures at least 70% with significant collapse of the left lung. Right lung is clear. Right IJ sheath in place again noted. Persistent subcutaneous emphysema right supraclavicular region.   IMPRESSION: There is progression in left pneumothorax measures at least 70% with significant collapse of the left lung. Right lung is clear. Right IJ sheath in place again noted. Persistent subcutaneous emphysema right supraclavicular region.  Critical findings discussed with patient's nurse Puja from Surgical ICU floor.   Electronically Signed   By: Lahoma Crocker M.D.   On: 03/20/2014 12:59   Dg Chest Port 1 View  03/20/2014   CLINICAL DATA:  Chest tube removal.  EXAM: PORTABLE CHEST - 1 VIEW  COMPARISON:  DG CHEST 1V PORT dated 03/19/2014  FINDINGS: 50% left pneumothorax has developed after chest tube removal. Swan-Ganz removed with the introducer left in place. Minimal right pleural effusion. Right lung is otherwise clear. Cardiomegaly.  IMPRESSION: 50% left pneumothorax. Critical Value/emergent results were called by telephone at the time of interpretation on 03/20/2014 at 7:51 AM to Dr. Emilie Rutter RN, who verbally acknowledged these results.   Electronically Signed   By: Maryclare Bean M.D.   On: 03/20/2014 07:51   Chest tube- small air leak    Assessment/Plan: S/P Procedure(s) (LRB): CORONARY ARTERY BYPASS GRAFTING (CABG) x 4 using endoscopically harvested right saphenous vein and left internal mammary artery (N/A) INTRAOPERATIVE TRANSESOPHAGEAL ECHOCARDIOGRAM (N/A) 1 doing well 2 keep chest tube with small air leak 3 no new labs 4 will start low dose ace inhib 5 routine rehab/pulm toilet 6 tx to 2 [place] E 03/21/2014 7:58 AM   Chart reviewed, patient examined, agree with above.

## 2014-03-22 ENCOUNTER — Inpatient Hospital Stay (HOSPITAL_COMMUNITY): Payer: Managed Care, Other (non HMO)

## 2014-03-22 NOTE — Progress Notes (Signed)
4 Days Post-Op Procedure(s) (LRB): CORONARY ARTERY BYPASS GRAFTING (CABG) x 4 using endoscopically harvested right saphenous vein and left internal mammary artery (N/A) INTRAOPERATIVE TRANSESOPHAGEAL ECHOCARDIOGRAM (N/A) Subjective: C/o pain from chest tube No issues with breathing  Objective: Vital signs in last 24 hours: Temp:  [97.6 F (36.4 C)-98.7 F (37.1 C)] 97.9 F (36.6 C) (03/28 1610) Pulse Rate:  [85-103] 85 (03/28 9604) Cardiac Rhythm:  [-] Normal sinus rhythm (03/28 0745) Resp:  [20-25] 20 (03/27 1300) BP: (103-138)/(60-80) 138/80 mmHg (03/28 0632) SpO2:  [91 %-96 %] 95 % (03/28 5409) Weight:  [219 lb 12.8 oz (99.701 kg)] 219 lb 12.8 oz (99.701 kg) (03/28 0500)  Hemodynamic parameters for last 24 hours:    Intake/Output from previous day: 03/27 0701 - 03/28 0700 In: 840 [P.O.:840] Out: 1290 [Urine:1200; Chest Tube:90] Intake/Output this shift:    General appearance: alert and no distress Neurologic: intact Heart: regular rate and rhythm Lungs: clear to auscultation bilaterally Wound: clean and dry  Lab Results:  Recent Labs  03/19/14 1700 03/19/14 1711 03/20/14 0400  WBC 10.2  --  9.8  HGB 11.3* 10.5* 11.0*  HCT 32.9* 31.0* 32.3*  PLT 169  --  167   BMET:  Recent Labs  03/19/14 1711 03/20/14 0400  NA 139 139  K 4.5 4.6  CL 100 102  CO2  --  25  GLUCOSE 128* 119*  BUN 18 15  CREATININE 1.20 0.96  CALCIUM  --  8.7    PT/INR: No results found for this basename: LABPROT, INR,  in the last 72 hours ABG    Component Value Date/Time   PHART 7.357 03/18/2014 1754   HCO3 30.2* 03/18/2014 1754   TCO2 26 03/19/2014 1711   ACIDBASEDEF 1.0 03/18/2014 1320   O2SAT 94.0 03/18/2014 1754   CBG (last 3)   Recent Labs  03/20/14 0334 03/20/14 0717 03/20/14 1228  GLUCAP 104* 120* 118*    Assessment/Plan: S/P Procedure(s) (LRB): CORONARY ARTERY BYPASS GRAFTING (CABG) x 4 using endoscopically harvested right saphenous vein and left internal mammary  artery (N/A) INTRAOPERATIVE TRANSESOPHAGEAL ECHOCARDIOGRAM (N/A) POD # 4 CABG CV- stable  Has minimal air leak- will place CT to water seal and recheck in AM- hopefully can remove tomorrow  Continue ambulation   LOS: 8 days    Alanmichael Barmore C 03/22/2014

## 2014-03-22 NOTE — Progress Notes (Signed)
Pt ambulated in hallway 350 ft on room air and tolerated activity well. Will continue to monitor.  

## 2014-03-22 NOTE — Progress Notes (Signed)
CARDIAC REHAB PHASE I   Pt completed ambulation around 1030, prior to visiting pt.  Pt stated he would walk x2 later this afternoon.  Completed sternal precaution education and IS use.  Pt interested in CRP II in [place].  [id number] Kenneth [name], Blenda Nicely MS, ACSM RCEP 03/22/2014

## 2014-03-23 ENCOUNTER — Inpatient Hospital Stay (HOSPITAL_COMMUNITY): Payer: Managed Care, Other (non HMO)

## 2014-03-23 MED ORDER — LACTULOSE 10 GM/15ML PO SOLN
20.0000 g | Freq: Once | ORAL | Status: DC
  Filled 2014-03-23: qty 30

## 2014-03-23 NOTE — Progress Notes (Signed)
Pt ambulated in hallway 500 ft and tolerated activity well on room air. Will continue to monitor.

## 2014-03-23 NOTE — Progress Notes (Signed)
CT and EPWs removed per Md order and protocol. Pt tolerated procedures well. Pt resting in bed X 1 hour. Wife at bedside. Vital signs stable. Will continue to monitor.

## 2014-03-23 NOTE — Progress Notes (Addendum)
      [address]       [facility] [place] [address]             [phone]        5 Days Post-Op Procedure(s) (LRB): CORONARY ARTERY BYPASS GRAFTING (CABG) x 4 using endoscopically harvested right saphenous vein and left internal mammary artery (N/A) INTRAOPERATIVE TRANSESOPHAGEAL ECHOCARDIOGRAM (N/A)  Subjective: Patient hopes to have chest tube removed. His only complaint is constipation.  Objective: Vital signs in last 24 hours: Temp:  [97.7 F (36.5 C)-98.2 F (36.8 C)] 97.9 F (36.6 C) (03/29 0505) Pulse Rate:  [79-95] 79 (03/29 0505) Cardiac Rhythm:  [-] Normal sinus rhythm (03/28 2019) Resp:  [18-19] 18 (03/29 0505) BP: (98-122)/(59-65) 98/59 mmHg (03/29 0505) SpO2:  [93 %-96 %] 93 % (03/29 0505) Weight:  [216 lb 11.4 oz (98.3 kg)] 216 lb 11.4 oz (98.3 kg) (03/29 0505)  Pre op weight 99.3 kg Current Weight  03/23/14 216 lb 11.4 oz (98.3 kg)      Intake/Output from previous day: 03/28 0701 - 03/29 0700 In: 120 [P.O.:120] Out: 1310 [Urine:1200; Chest Tube:110]   Physical Exam:  Cardiovascular: RRR, no murmurs, gallops, or rubs. Pulmonary: Clear to auscultation bilaterally; no rales, wheezes, or rhonchi. Abdomen: Soft, non tender, bowel sounds present. Extremities: No lower extremity edema. Wounds: Clean and dry.  No erythema or signs of infection.  Lab Results: CBC:No results found for this basename: WBC, HGB, HCT, PLT,  in the last 72 hours BMET: No results found for this basename: NA, K, CL, CO2, GLUCOSE, BUN, CREATININE, CALCIUM,  in the last 72 hours  PT/INR:  Lab Results  Component Value Date   INR 1.37 03/18/2014   INR 1.12 03/13/2014   ABG:  INR: Will add last result for INR, ABG once components are confirmed Will add last 4 CBG results once components are confirmed  Assessment/Plan:  1. CV - SR. On Lopressor 25 bid, Lisinopril 10 daily. SBP labile this am. Hold Lisinopril. 2.  Pulmonary - Chest tube with 40 cc last 12 hours (110  for last 24 hours). No air leak. CXR appears to show questionable,small, left apical pneumothorax and bibasilar atelectasis.Remove chest tube.Encourage incentive spirometer and flutter valve 3. Volume Overload - On Lasix 40 daily and is below pre op weight. Stop diuresis after today 4.  Acute blood loss anemia - Last H and H stable at 11 and 32.3 5. Remove EPW 6.LOC constipation 7. Possible discharge 1-2 days  ZIMMERMAN,DONIELLE MPA-C 03/23/2014,7:54 AM  Patient seen and examined, agree with above. Dc chest tube Probably home in AM

## 2014-03-24 ENCOUNTER — Encounter (INDEPENDENT_AMBULATORY_CARE_PROVIDER_SITE_OTHER): Payer: Self-pay

## 2014-03-24 ENCOUNTER — Telehealth: Payer: Self-pay | Admitting: *Deleted

## 2014-03-24 ENCOUNTER — Inpatient Hospital Stay (HOSPITAL_COMMUNITY): Payer: Managed Care, Other (non HMO)

## 2014-03-24 MED ORDER — OXYCODONE HCL 5 MG PO TABS: 5.0000 mg | ORAL_TABLET | ORAL | Status: DC | PRN

## 2014-03-24 MED ORDER — TRAMADOL HCL 50 MG PO TABS: 50.0000 mg | ORAL_TABLET | ORAL | Status: DC | PRN

## 2014-03-24 MED ORDER — LISINOPRIL 10 MG PO TABS: 10.0000 mg | ORAL_TABLET | Freq: Every day | ORAL | Status: DC

## 2014-03-24 MED ORDER — ASPIRIN 325 MG PO TBEC: 325.0000 mg | DELAYED_RELEASE_TABLET | Freq: Every day | ORAL | Status: DC

## 2014-03-24 NOTE — Progress Notes (Signed)
      [address]       [place] [address]             [phone]      6 Days Post-Op Procedure(s) (LRB): CORONARY ARTERY BYPASS GRAFTING (CABG) x 4 using endoscopically harvested right saphenous vein and left internal mammary artery (N/A) INTRAOPERATIVE TRANSESOPHAGEAL ECHOCARDIOGRAM (N/A)  Subjective:  Mr. Kenneth [name] has no complaints this morning.  He states he is doing well and is hoping to be discharged home today.  + ambulation, + BM  Objective: Vital signs in last 24 hours: Temp:  [97.8 F (36.6 C)-98.2 F (36.8 C)] 97.8 F (36.6 C) (03/30 0332) Pulse Rate:  [72-84] 82 (03/30 0332) Cardiac Rhythm:  [-] Normal sinus rhythm (03/29 2002) Resp:  [18] 18 (03/30 0332) BP: (113-121)/(70-80) 121/80 mmHg (03/30 0332) SpO2:  [95 %-98 %] 95 % (03/30 0332) Weight:  [211 lb 10.3 oz (96 kg)] 211 lb 10.3 oz (96 kg) (03/30 0300)  Intake/Output from previous day: 03/29 0701 - 03/30 0700 In: -  Out: 425 [Urine:425]  General appearance: alert, cooperative and no distress Heart: regular rate and rhythm Lungs: clear to auscultation bilaterally Abdomen: soft, non-tender; bowel sounds normal; no masses,  no organomegaly Extremities: edema trace Wound: clean and dry, ecchymosis LLE  Lab Results: No results found for this basename: WBC, HGB, HCT, PLT,  in the last 72 hours BMET: No results found for this basename: NA, K, CL, CO2, GLUCOSE, BUN, CREATININE, CALCIUM,  in the last 72 hours  PT/INR: No results found for this basename: LABPROT, INR,  in the last 72 hours ABG    Component Value Date/Time   PHART 7.357 03/18/2014 1754   HCO3 30.2* 03/18/2014 1754   TCO2 26 03/19/2014 1711   ACIDBASEDEF 1.0 03/18/2014 1320   O2SAT 94.0 03/18/2014 1754   CBG (last 3)  No results found for this basename: GLUCAP,  in the last 72 hours  Assessment/Plan: S/P Procedure(s) (LRB): CORONARY ARTERY BYPASS GRAFTING (CABG) x 4 using endoscopically harvested right saphenous vein and  left internal mammary artery (N/A) INTRAOPERATIVE TRANSESOPHAGEAL ECHOCARDIOGRAM (N/A)  1. CV- NSR good rate and pressure control- continue Lisinopril, Lopressor 2. Pulm- no acute issues, CT removed yesterday,  CXR with improvement of apical pneumothorax 3. Renal- Volume status is below baseline, Lasix stopped yesterday 4. Dispo- patient is doing very well, will d/c home today   LOS: 10 days    Ahmed Prima, ERIN 03/24/2014

## 2014-03-24 NOTE — Progress Notes (Signed)
Pt and wife given d/c instructions; both verbalized understanding; IV and tele monitor d/c at this time; will cont. To monitor.

## 2014-03-24 NOTE — Progress Notes (Addendum)
Chest tube sutures removed at this time from 3 chest tube sites on anterior chest; steri strips applied to site; will cont. To monitor; chest tube sutures on L lateral chest left in place; that chest tube was just removed yesterday; per Dr. Roxy Manns leave in place; will cont. To monitor.

## 2014-03-24 NOTE — Telephone Encounter (Signed)
He should see Dr. Debara Pickett in 2 weeks or Extender per AVS at [facility] on 3.18.15 w/ Dr. Claiborne Billings.

## 2014-03-24 NOTE — Discharge Instructions (Signed)
Activity: 1.May walk up steps                2.No lifting more than ten pounds for four weeks.                 3.No driving for four weeks.                4.Stop any activity that causes chest pain, shortness of breath, dizziness,  sweating or excessive weakness.                5.Avoid straining.                6.Continue with your breathing exercises daily.  Diet: Low fat, Low salt  diet  Wound Care: May shower.  Clean wounds with mild soap and water daily. Contact the office at [phone] if any problems arise. Coronary Artery Bypass Grafting, Care After Refer to this sheet in the next few weeks. These instructions provide you with information on caring for yourself after your procedure. Your health care provider may also give you more specific instructions. Your treatment has been planned according to current medical practices, but problems sometimes occur. Call your health care provider if you have any problems or questions after your procedure. WHAT TO EXPECT AFTER THE PROCEDURE Recovery from surgery will be different for everyone. Some people feel well after 3 or 4 weeks, while for others it takes longer. After your procedure, it is typical to have the following:  Nausea and a lack of appetite.   Constipation.  Weakness and fatigue.   Depression or irritability.   Pain or discomfort at your incision site. HOME CARE INSTRUCTIONS  Only take over-the-counter or prescription medicines as directed by your health care provider. Take all medicines exactly as directed. Do not stop taking medicines or start any new medicines without first checking with your health care provider.   Take your pulse as directed by your health care provider.  Perform deep breathing as directed by your health care provider. If you were given a device called an incentive spirometer, use it to practice deep breathing several times a day. Support your chest with a pillow or your arms when you take deep  breaths or cough.  Keep incision areas clean, dry, and protected. Remove or change any bandages (dressings) only as directed by your health care provider. You may have skin adhesive strips over the incision areas. Do not take the strips off. They will fall off on their own.  Check incision areas daily for any swelling, redness, or drainage.  If incisions were made in your legs, do the following:  Avoid crossing your legs.   Avoid sitting for long periods of time. Change positions every 30 minutes.   Elevate your legs when you are sitting.   Wear compression stockings as directed by your health care provider. These stockings help keep blood clots from forming in your legs.  Take showers once your health care provider approves. Until then, only take sponge baths. Pat incisions dry. Do not rub incisions with a washcloth or towel. Do not take tub baths or go swimming until your health care provider approves.  Eat foods that are high in fiber, such as raw fruits and vegetables, whole grains, beans, and nuts. Meats should be lean cut. Avoid canned, processed, and fried foods.  Drink enough fluids to keep your urine clear or pale yellow.  Weigh yourself every day. This helps identify if you are retaining fluid  that may make your heart and lungs work harder.   Rest and limit activity as directed by your health care provider. You may be instructed to:  Stop any activity at once if you have chest pain, shortness of breath, irregular heartbeats, or dizziness. Get help right away if you have any of these symptoms.  Move around frequently for short periods or take short walks as directed by your health care provider. Increase your activities gradually. You may need physical therapy or cardiac rehabilitation to help strengthen your muscles and build your endurance.  Avoid lifting, pushing, or pulling anything heavier than 10 lb (4.5 kg) for at least 6 weeks after surgery.  Do not drive until  your health care provider approves.  Ask your health care provider when you may return to work and resume sexual activity.  Follow up with your health care provider as directed.  SEEK MEDICAL CARE IF:  You have swelling, redness, increasing pain, or drainage at the site of an incision.   You develop a fever.   You have swelling in your ankles or legs.   You have pain in your legs.   You have weight gain of 2 or more pounds a day.  You are nauseous or vomit.  You have diarrhea. SEEK IMMEDIATE MEDICAL CARE IF:  You have chest pain that goes to your jaw or arms.  You have shortness of breath.   You have a fast or irregular heartbeat.   You notice a "clicking" in your breastbone (sternum) when you move.   You have numbness or weakness in your arms or legs.  You feel dizzy or lightheaded.  MAKE SURE YOU:  Understand these instructions.  Will watch your condition.  Will get help right away if you are not doing well or get worse. Document Released: 07/01/2005 Document Revised: 08/14/2013 Document Reviewed: 05/21/2013 [facility] Patient Information 2014 [place].  Endoscopic Saphenous Vein Harvesting Care After Refer to this sheet in the next few weeks. These instructions provide you with information on caring for yourself after your procedure. Your caregiver may also give you more specific instructions. Your treatment has been planned according to current medical practices, but problems sometimes occur. Call your caregiver if you have any problems or questions after your procedure. HOME CARE INSTRUCTIONS Medicine  Take whatever pain medicine your surgeon prescribes. Follow the directions carefully. Do not take over-the-counter pain medicine unless your surgeon says it is okay. Some pain medicine can cause bleeding problems for several weeks after surgery.  Follow your surgeon's instructions about driving. You will probably not be permitted to drive after  heart surgery.  Take any medicines your surgeon prescribes. Any medicines you took before your heart surgery should be checked with your caregiver before you start taking them again. Wound care  Ask your surgeon how long you should keep wearing your elastic bandage or stocking.  Check the area around your surgical cuts (incisions) whenever your bandages (dressings) are changed. Look for any redness or swelling.  You will need to return to have the stitches (sutures) or staples taken out. Ask your surgeon when to do that.  Ask your surgeon when you can shower or bathe. Activity  Try to keep your legs raised when you are sitting.  Do any exercises your caregivers have given you. These may include deep breathing exercises, coughing, walking, or other exercises. SEEK MEDICAL CARE IF:  You have any questions about your medicines.  You have more leg pain, especially if your  pain medicine stops working.  New or growing bruises develop on your leg.  Your leg swells, feels tight, or becomes red.  You have numbness in your leg. SEEK IMMEDIATE MEDICAL CARE IF:  Your pain gets much worse.  Blood or fluid leaks from any of the incisions.  Your incisions become warm, swollen, or red.  You have chest pain.  You have trouble breathing.  You have a fever.  You have more pain near your leg incision. MAKE SURE YOU:  Understand these instructions.  Will watch your condition.  Will get help right away if you are not doing well or get worse. Document Released: 08/24/2011 Document Revised: 03/05/2012 Document Reviewed: 08/24/2011 [facility] Patient Information 2014 [place], [place].

## 2014-03-24 NOTE — Telephone Encounter (Signed)
Pt's wife called stating that his discharge summary from his bypass says to come back to see Dr. Claiborne Billings in 2 weeks. He is a [facility] patient and she wants to know if he needs to see Dr. Claiborne Billings or Dr. Debara Pickett.  TK

## 2014-03-24 NOTE — Progress Notes (Signed)
[id number] Cardiac Rehab Completed discharge education with pt and wife. They voice understanding. Pt and wife with many questions. Pt has agreed to Outpt. CRP in [place],.  Deon Pilling, RN 03/24/2014 12:29 PM

## 2014-03-26 ENCOUNTER — Telehealth: Payer: Self-pay | Admitting: *Deleted

## 2014-03-26 ENCOUNTER — Ambulatory Visit: Payer: Managed Care, Other (non HMO) | Admitting: Cardiology

## 2014-03-26 NOTE — Telephone Encounter (Signed)
Returned call and informed wife per instructions by MD.  Verbalized understanding and agreed w/ plan.  Also agreed to make sure pt increased water intake and to call back if BP >130/90.

## 2014-03-26 NOTE — Telephone Encounter (Signed)
Hold metoprolol.

## 2014-03-26 NOTE — Telephone Encounter (Signed)
Returned call and pt verified x 2 w/ Stanton Kidney, pt's wife.  Stated last night pt's BP was 100/70 and 96/72 and they called the on-call MD at the surgeon's office.  Stated they talked to Dr. Servando Snare who told them to hold the metoprolol last night, but didn't give instructions for today.  Today BP was 115/7? And recheck now: 104/67 HR 81.    Pt on phone and is asymptomatic.  Denied being symptomatic last night.  Informed RN will notify provider for instructions regarding metoprolol as BP and HR are controlled w/o a dose today and next appt in our office is 4.17.15.  Pt verbalized understanding and agreed w/ plan.  Message forwarded to Dr. Debara Pickett.

## 2014-03-26 NOTE — Telephone Encounter (Signed)
Returned call.  Left message to call back before 4pm.  

## 2014-03-26 NOTE — Telephone Encounter (Signed)
Retuning your call

## 2014-03-26 NOTE — Telephone Encounter (Signed)
Please call asap,waiting to find out if she can give him his morning dose?

## 2014-03-26 NOTE — Telephone Encounter (Signed)
Pt had open heart surgery last Tuesday and he is having low BP readings. He called the doctor on call last night and they told him to call to speak to Dr. Debara Pickett this morning.

## 2014-03-27 ENCOUNTER — Other Ambulatory Visit: Payer: Self-pay | Admitting: *Deleted

## 2014-03-27 DIAGNOSIS — G8918 Other acute postprocedural pain: Secondary | ICD-10-CM

## 2014-03-27 MED ORDER — OXYCODONE HCL 5 MG PO TABS: 5.0000 mg | ORAL_TABLET | ORAL | Status: DC | PRN

## 2014-03-31 ENCOUNTER — Other Ambulatory Visit: Payer: Self-pay | Admitting: *Deleted

## 2014-03-31 ENCOUNTER — Encounter (INDEPENDENT_AMBULATORY_CARE_PROVIDER_SITE_OTHER): Payer: Self-pay

## 2014-03-31 DIAGNOSIS — G8918 Other acute postprocedural pain: Secondary | ICD-10-CM

## 2014-03-31 DIAGNOSIS — I2510 Atherosclerotic heart disease of native coronary artery without angina pectoris: Secondary | ICD-10-CM

## 2014-03-31 MED ORDER — OXYCODONE HCL 5 MG PO TABS: 5.0000 mg | ORAL_TABLET | ORAL | Status: DC | PRN

## 2014-04-01 ENCOUNTER — Telehealth: Payer: Self-pay | Admitting: *Deleted

## 2014-04-01 NOTE — Telephone Encounter (Signed)
[facility]axed to [facility] orders for phase 2 cardiac rehab

## 2014-04-09 ENCOUNTER — Other Ambulatory Visit: Payer: Self-pay | Admitting: *Deleted

## 2014-04-09 DIAGNOSIS — G8918 Other acute postprocedural pain: Secondary | ICD-10-CM

## 2014-04-09 MED ORDER — HYDROCODONE-ACETAMINOPHEN 7.5-325 MG PO TABS: 1.0000 | ORAL_TABLET | ORAL | Status: DC | PRN

## 2014-04-11 ENCOUNTER — Encounter: Payer: Self-pay | Admitting: Internal Medicine

## 2014-04-11 ENCOUNTER — Ambulatory Visit (INDEPENDENT_AMBULATORY_CARE_PROVIDER_SITE_OTHER): Payer: Managed Care, Other (non HMO) | Admitting: Internal Medicine

## 2014-04-11 VITALS — BP 96/72 | HR 57 | Ht 69.0 in | Wt 209.6 lb

## 2014-04-11 DIAGNOSIS — E785 Hyperlipidemia, unspecified: Secondary | ICD-10-CM

## 2014-04-11 DIAGNOSIS — Z951 Presence of aortocoronary bypass graft: Secondary | ICD-10-CM

## 2014-04-11 DIAGNOSIS — I10 Essential (primary) hypertension: Secondary | ICD-10-CM

## 2014-04-11 DIAGNOSIS — I2510 Atherosclerotic heart disease of native coronary artery without angina pectoris: Secondary | ICD-10-CM

## 2014-04-11 DIAGNOSIS — I2589 Other forms of chronic ischemic heart disease: Secondary | ICD-10-CM

## 2014-04-11 NOTE — Progress Notes (Signed)
OFFICE NOTE  Chief Complaint:  Followup  Primary Care Physician: Moshe Cipro, MD  HPI:  Kenneth [name] is a 48 year old male who had been seen by [place] in consult previously. Dr. Debara Pickett saw him in August 2012. He had been having angina. He reportedly had normal epicardial coronaries at catheterization in [place]. He had an extensive workup there that included cardiac and GI evaluation. He was diagnosed with microvascular angina. He had been maintained on nitroglycerin, which helped. He actually even had EECP in the past which he said helped as well. He recently moved to the area and wanted to get established with a cardiologist. Dr. Debara Pickett recommended resuming his Ranexa. He also resumed his nitroglycerin patch and refilled his amlodipine. No functional studies have been done at our office. He was referred back to Dr. Ronnald Ramp for followup. He reports however several weeks ago he thinks he got out of sync with his medications. He ran out of his nitroglycerin patch and was taking more short acting nitroglycerin. He says reestablished his medicines, but reports that he continues to have some worsening chest pain and shortness of breath. He continues to exercise but is noticing more anginal symptoms earlier in his exercise.  Based on the change in his angina pattern, a stress test was recommended. This was performed on 03/12/2014 which showed a marked LCX territory defect with EF 47%.  This was a high risk study. Cardiac catheterization was recommended and performed the next day by Dr. Claiborne Billings.  This revealed the following:  Low normal to mild LV dysfunction with an ejection fraction of 50% with subtle mild anterolateral hypocontractility.  Significant multivessel coronary obstructive disease with 60-70% LAD stenosis after the first septal perforating artery and 50% mid LAD stenosis, probable chronic occlusion of the left circumflex vessel with collateralization to the distal marginal branch and 95% stenosis  in the first marginal branch with 99% stenosis in its distal segment; and probable previous subtotal stenosis of the proximal RCA with bridging collaterals and evidence for a total distal RCA stenosis with evidence for left to right collaterals.  Based on these findings, CABG was recommended.  He underwent 4 vessel CABG on 03/18/14, as follows:  1. Median Sternotomy 2. Extracorporeal circulation       3.   Coronary artery bypass grafting x 4  Left internal mammary graft to the LAD  SVG to OM  Sequential SVG to PDA and PL       4. Endoscopic vein harvest from the right leg  In followup today, [name] is feeling quite well. His energy is improved and his color is better. He reports some mild soreness to his sternal wound but otherwise is recovering nicely. He is scheduled to see Dr. Cyndia Bent on April 22. He will be starting cardiac rehabilitation fairly soon. He has several questions about his procedure which we went over in detail today. He is quite thankful for he is bypass surgery. The only notable finding today is that he's been having some mild hypotension which I believe is related to overmedication.  PMHx:  Past Medical History  Diagnosis Date  . Syndrome X, cardiac   . Hypertension   . Hyperlipidemia   . History of Doppler ultrasound 07/09/2013    carotid doppler; moderate narrowing of both subclavian arteries, with normal carotid arteries    Past Surgical History  Procedure Laterality Date  . Kidney stone surgery  age 80  . Shoulder arthroscopy  2001    L shoulder  .  Cardiac catheterization  2005  . Coronary artery bypass graft N/A 03/18/2014    Procedure: CORONARY ARTERY BYPASS GRAFTING (CABG) x 4 using endoscopically harvested right saphenous vein and left internal mammary artery;  Surgeon: Gaye Pollack, MD;  Location: [facility] OR;  Service: Open Heart Surgery;  Laterality: N/A;  . Intraoperative transesophageal echocardiogram N/A 03/18/2014    Procedure: INTRAOPERATIVE  TRANSESOPHAGEAL ECHOCARDIOGRAM;  Surgeon: Gaye Pollack, MD;  Location: [facility] OR;  Service: Open Heart Surgery;  Laterality: N/A;    FAMHx:  Family History  Problem Relation Age of Onset  . Heart attack Father   . Cancer Father     lung  . Heart attack Mother   . Stroke Mother   . Diabetes Mother     SOCHx:   reports that he quit smoking about 21 months ago. His smoking use included Cigars. He has never used smokeless tobacco. He reports that he does not drink alcohol or use illicit drugs.  ALLERGIES:  Allergies  Allergen Reactions  . Tetracyclines & Related Nausea And Vomiting    ROS: A comprehensive review of systems was negative.  HOME MEDS: Current Outpatient Prescriptions  Medication Sig Dispense Refill  . aspirin EC 325 MG EC tablet Take 1 tablet (325 mg total) by mouth daily.  30 tablet  0  . atorvastatin (LIPITOR) 80 MG tablet Take 80 mg by mouth at bedtime.      Marland Kitchen buPROPion (WELLBUTRIN XL) 300 MG 24 hr tablet Take 300 mg by mouth daily.      Marland Kitchen gemfibrozil (LOPID) 600 MG tablet Take 600 mg by mouth 2 (two) times daily before a meal.      . lisinopril (PRINIVIL,ZESTRIL) 10 MG tablet Take 1 tablet (10 mg total) by mouth daily.  30 tablet  3  . metoprolol tartrate (LOPRESSOR) 25 MG tablet Take 12.5 mg by mouth 2 (two) times daily.      . TESTOSTERONE IM Inject 1.5 mLs into the muscle every 7 (seven) days.       Marland Kitchen zolpidem (AMBIEN) 10 MG tablet Take 10 mg by mouth at bedtime as needed for sleep.      Marland Kitchen HYDROcodone-acetaminophen (NORCO) 7.5-325 MG per tablet Take 1 tablet by mouth every 4 (four) hours as needed for moderate pain.  40 tablet  0   No current facility-administered medications for this visit.    LABS/IMAGING: No results found for this or any previous visit (from the past 48 hour(s)). No results found.  VITALS: BP 96/72  Pulse 57  Ht 5\' 9"  (1.753 m)  Wt 209 lb 9.6 oz (95.074 kg)  BMI 30.94 kg/m2  EXAM: General appearance: alert and no distress Neck:  no adenopathy, no JVD, supple, symmetrical, trachea midline, thyroid not enlarged, symmetric, no tenderness/mass/nodules and Soft bruit at the left neck base Lungs: clear to auscultation bilaterally Heart: regular rate and rhythm, S1, S2 normal, no murmur, click, rub or gallop Abdomen: soft, non-tender; bowel sounds normal; no masses,  no organomegaly Extremities: extremities normal, atraumatic, no cyanosis or edema Pulses: 2+ and symmetric Skin: Skin color, texture, turgor normal. No rashes or lesions Neurologic: Grossly normal  EKG: Sinus bradycardia at 57  ASSESSMENT: 1. CAD s/p 4 vessel CABG (LIMA to LAD, SVG to OM, and sequential SVG to PDA/PLB) - 02/2014 2. History of microvascular angina (normal coronaries by cath in 2010) - had EECP in the past 3. Hypertension-uncontrolled 4. Hyperlipidemia 5. Obesity  PLAN: 1.   Mr. Niven is markedly improved after  bypass surgery. Unfortunately he had a small pneumothorax which improved with chest tube drainage. He is no longer describing any angina. His pressure is low normal today with a low heart rate. I would recommend decreasing his metoprolol tartrate to 12.5 mg twice daily. He is now on high-dose Lipitor I would recommend a recheck of his lipid NMR in 3 months. Finally his EF was between 47 and 50% by cath pre-bypass. I expect that this has normalized however I would recommend a repeat echocardiogram in 6 months prior to her followup visit to see if his LV function is normal. Was a pleasure seeing Mr. Souder back in the office today.  Followup in 6 months.   Pixie Casino, MD, [employer] [facility] [employer] Attending Cardiologist [facility] 04/11/2014, 5:14 PM

## 2014-04-11 NOTE — Patient Instructions (Addendum)
Your physician wants you to follow-up in: 6 months. You will receive a reminder letter in the mail two months in advance. If you don't receive a letter, please call our office to schedule the follow-up appointment.  Please schedule an echocardiogram to be done in 6 months, prior to your office visit.   Your physician recommends that you return for lab work in: 3 months - fasting  Decrease metoprolol tartrate to 12.5mg  twice daily.

## 2014-04-15 ENCOUNTER — Telehealth (HOSPITAL_COMMUNITY): Payer: Self-pay | Admitting: *Deleted

## 2014-04-16 ENCOUNTER — Other Ambulatory Visit: Payer: Self-pay | Admitting: *Deleted

## 2014-04-16 ENCOUNTER — Encounter: Payer: Self-pay | Admitting: Surgery

## 2014-04-16 ENCOUNTER — Ambulatory Visit (INDEPENDENT_AMBULATORY_CARE_PROVIDER_SITE_OTHER): Payer: Self-pay | Admitting: Surgery

## 2014-04-16 ENCOUNTER — Ambulatory Visit
Admission: RE | Admit: 2014-04-16 | Discharge: 2014-04-16 | Disposition: A | Discharge status: Home / Self Care | Payer: Managed Care, Other (non HMO) | Source: Ambulatory Visit | Attending: Surgery | Admitting: Surgery

## 2014-04-16 VITALS — BP 119/76 | HR 70 | Resp 20 | Ht 69.0 in | Wt 209.0 lb

## 2014-04-16 DIAGNOSIS — I2510 Atherosclerotic heart disease of native coronary artery without angina pectoris: Secondary | ICD-10-CM

## 2014-04-16 DIAGNOSIS — G8918 Other acute postprocedural pain: Secondary | ICD-10-CM

## 2014-04-16 DIAGNOSIS — Z951 Presence of aortocoronary bypass graft: Secondary | ICD-10-CM

## 2014-04-16 MED ORDER — HYDROCODONE-ACETAMINOPHEN 7.5-325 MG PO TABS: 1.0000 | ORAL_TABLET | ORAL | Status: DC | PRN

## 2014-04-16 NOTE — Progress Notes (Signed)
     HPI:  Patient returns for routine postoperative follow-up having undergone CABG x 4 on 03/18/2014. The patient's early postoperative recovery while in the hospital was notable for an uncomplicated postop course. Since hospital discharge the patient reports that he has been progressing well. He still has some chest wall discomfort. He is ambulating well without chest pain or dyspnea.   Current Outpatient Prescriptions  Medication Sig Dispense Refill  . aspirin EC 325 MG EC tablet Take 1 tablet (325 mg total) by mouth daily.  30 tablet  0  . atorvastatin (LIPITOR) 80 MG tablet Take 80 mg by mouth at bedtime.      Marland Kitchen buPROPion (WELLBUTRIN XL) 300 MG 24 hr tablet Take 300 mg by mouth daily.      Marland Kitchen gemfibrozil (LOPID) 600 MG tablet Take 600 mg by mouth 2 (two) times daily before a meal.      . HYDROcodone-acetaminophen (NORCO) 7.5-325 MG per tablet Take 1 tablet by mouth every 4 (four) hours as needed for moderate pain.  40 tablet  0  . lisinopril (PRINIVIL,ZESTRIL) 10 MG tablet Take 1 tablet (10 mg total) by mouth daily.  30 tablet  3  . metoprolol tartrate (LOPRESSOR) 25 MG tablet Take 12.5 mg by mouth 2 (two) times daily.      . TESTOSTERONE IM Inject 1.5 mLs into the muscle every 7 (seven) days.       Marland Kitchen zolpidem (AMBIEN) 10 MG tablet Take 10 mg by mouth at bedtime as needed for sleep.       No current facility-administered medications for this visit.    Physical Exam: BP 119/76  Pulse 70  Resp 20  Ht 5\' 9"  (1.753 m)  Wt 209 lb (94.802 kg)  BMI 30.85 kg/m2  SpO2 97% He looks well. Lung exam is clear. Cardiac exam shows a regular rate and rhythm with normal heart sounds. Chest incision is healing well and sternum is stable. The leg incisions are healing well and there is no peripheral edema.   Diagnostic Tests:  CLINICAL DATA: Post CABG in March, follow-up chest x-ray of  03/24/2014  EXAM:  CHEST 2 VIEW  COMPARISON: None.  FINDINGS:  Aeration of the lungs has improved.  The previously noted bibasilar  atelectasis has resolved and no pleural effusion is seen. No  residual pneumothorax is noted. Cardiomegaly is stable. Median  sternotomy sutures are intact.  IMPRESSION:  No active lung disease. Resolution of both the basilar atelectasis  and the small left apical pneumothorax.  Electronically Signed  By: Ivar Drape M.D.  On: 04/16/2014 12:24   Impression:  Overall I think he is doing well. I encouraged him to continue walking. He is planning to participate in cardiac rehab. I told him he could drive his car but should not lift anything heavier than 10 lbs for three months postop.   Plan:  He will continue to follow up with Dr. Debara Pickett and will contact me if he develops any problems with his incisions.

## 2014-04-22 ENCOUNTER — Telehealth (HOSPITAL_COMMUNITY): Payer: Self-pay | Admitting: Cardiac Rehabilitation

## 2014-04-22 NOTE — Telephone Encounter (Signed)
3 telephone attempts to contact pt to schedule for cardiac rehab.  No return call from pt with voicemail left.  Letter mailed to pt home requesting call back to schedule

## 2014-04-23 ENCOUNTER — Telehealth (HOSPITAL_COMMUNITY): Payer: Self-pay | Admitting: *Deleted

## 2014-04-27 ENCOUNTER — Other Ambulatory Visit: Payer: Self-pay | Admitting: Physician Assistant

## 2014-04-28 ENCOUNTER — Telehealth: Payer: Self-pay | Admitting: *Deleted

## 2014-04-28 NOTE — Telephone Encounter (Signed)
Pt was calling in regards to his refill on his Ambien. He wanted to know if it was called in.  [facility]

## 2014-04-28 NOTE — Telephone Encounter (Signed)
Returned call to find out if this is being rx'd by Dr. Debara Pickett.  Mailbox full and cannot accept message.    Message forwarded to Dr. Josepha Pigg, RN.

## 2014-04-28 NOTE — Telephone Encounter (Signed)
We have supplied his [place].  Eliezer Lofts, can you renew his Rx?  Thanks.

## 2014-04-29 ENCOUNTER — Other Ambulatory Visit: Payer: Self-pay | Admitting: *Deleted

## 2014-04-29 MED ORDER — ZOLPIDEM TARTRATE 10 MG PO TABS: 10.0000 mg | ORAL_TABLET | Freq: Every evening | ORAL | Status: DC | PRN

## 2014-04-29 MED ORDER — ASPIRIN 325 MG PO TBEC: 325.0000 mg | DELAYED_RELEASE_TABLET | Freq: Every day | ORAL | Status: DC

## 2014-04-29 NOTE — Telephone Encounter (Signed)
Phoned in refill #30 with 5 refills

## 2014-04-29 NOTE — Telephone Encounter (Signed)
Rx was sent to pharmacy electronically. 

## 2014-05-02 ENCOUNTER — Telehealth (HOSPITAL_COMMUNITY): Payer: Self-pay | Admitting: *Deleted

## 2014-05-08 ENCOUNTER — Inpatient Hospital Stay (HOSPITAL_COMMUNITY)
Admission: RE | Admit: 2014-05-08 | Discharge: 2014-05-08 | Disposition: A | Discharge status: Home / Self Care | Payer: Managed Care, Other (non HMO) | Source: Ambulatory Visit

## 2014-05-12 ENCOUNTER — Encounter (HOSPITAL_COMMUNITY)
Admission: RE | Admit: 2014-05-12 | Discharge: 2014-05-12 | Disposition: A | Discharge status: Home / Self Care | Payer: Managed Care, Other (non HMO) | Source: Ambulatory Visit | Attending: Internal Medicine | Admitting: Internal Medicine

## 2014-05-12 DIAGNOSIS — Z5189 Encounter for other specified aftercare: Secondary | ICD-10-CM | POA: Diagnosis not present

## 2014-05-12 DIAGNOSIS — E785 Hyperlipidemia, unspecified: Secondary | ICD-10-CM | POA: Insufficient documentation

## 2014-05-12 DIAGNOSIS — I2510 Atherosclerotic heart disease of native coronary artery without angina pectoris: Secondary | ICD-10-CM | POA: Diagnosis not present

## 2014-05-12 DIAGNOSIS — I200 Unstable angina: Secondary | ICD-10-CM | POA: Diagnosis not present

## 2014-05-12 DIAGNOSIS — I10 Essential (primary) hypertension: Secondary | ICD-10-CM | POA: Insufficient documentation

## 2014-05-12 DIAGNOSIS — Z951 Presence of aortocoronary bypass graft: Secondary | ICD-10-CM | POA: Diagnosis present

## 2014-05-12 NOTE — Progress Notes (Signed)
Pt in today for first day of exercise in cardiac rehab phase II program.  Monitor shows SR/SB  Pt with history of Sinus brady on his most recent EKG with no noted ectopy. Pt with post exercise hr of 51.  Pt with recent increase in beta blocker.  Will send rehab report and ekg strip for MD review.  Pt tolerated light exercise with no complaints.  PHQ2 score 0.  Pt is not back to doing all the leisure activities but is well on his way. Pt reports his short term goal is to lose weight.  It will be important for pt to adhere to home exercise and monitor food intake.  Pt long term goal is to get back to martial arts and drop body fat.  For martial arts may be a goal achieve post rehab due to high met level and sternal precautions.  Will continue to monitor his progress toward this goal.

## 2014-05-14 ENCOUNTER — Telehealth (HOSPITAL_COMMUNITY): Payer: Self-pay | Admitting: *Deleted

## 2014-05-14 ENCOUNTER — Encounter (HOSPITAL_COMMUNITY)
Admission: RE | Admit: 2014-05-14 | Discharge: 2014-05-14 | Disposition: A | Discharge status: Home / Self Care | Payer: Managed Care, Other (non HMO) | Source: Ambulatory Visit | Attending: Internal Medicine | Admitting: Internal Medicine

## 2014-05-14 DIAGNOSIS — Z5189 Encounter for other specified aftercare: Secondary | ICD-10-CM | POA: Diagnosis not present

## 2014-05-14 NOTE — Progress Notes (Signed)
Received response from Dr. Debara Pickett.  Pt informed the correct dosage is 12.5mg  twice a day.  Pt reported that he had received a call from the office letting him know he should only take 12.5mg  twice a day.  Pt started this on yesterday.

## 2014-05-14 NOTE — Telephone Encounter (Signed)
Message copied by Rowe Pavy on Wed May 14, 2014  7:50 AM ------      Message from: Pixie Casino      Created: Mon May 12, 2014  4:07 PM      Regarding: RE: Low Hr post exercise       Yes .. 12.5 mg daily is enough. Thanks            ----- Message -----         From: Rowe Pavy, RN         Sent: 05/12/2014   1:54 PM           To: Pixie Casino, MD      Subject: Low Hr post exercise                                      Pt in today for his first day of exercise.  Pt with noted sinus brady on most recent 12 lead ekg 4/17.  Pt with HR 51, BP 100/70 sinus brady at the completion of exercise. Pt with recent change in beta blocker therapy metoprolol tartrate increased from 12.5mg  to 25mg  twice a day by you (however when I looked at your note it indicated the dosage decreased to 12.5 due to low bp and hr).  Pt thought he heard you say to increase to 25mg  due to high bp??  Please clarify the dosage - I recommended he cut back to 12.5 as last indicated until I heard back from you.  I will send the complete rehab report and EKg tracing for your review.  Pre ex bp 116.\/72 and during exercise remained in the 110's over 60-70.                  Thanks so much!      Maurice Small RN       ------

## 2014-05-16 ENCOUNTER — Encounter (HOSPITAL_COMMUNITY): Payer: Managed Care, Other (non HMO)

## 2014-05-19 ENCOUNTER — Encounter (HOSPITAL_COMMUNITY): Payer: Managed Care, Other (non HMO)

## 2014-05-21 ENCOUNTER — Encounter (HOSPITAL_COMMUNITY)
Admission: RE | Admit: 2014-05-21 | Discharge: 2014-05-21 | Disposition: A | Discharge status: Home / Self Care | Payer: Managed Care, Other (non HMO) | Source: Ambulatory Visit | Attending: Internal Medicine | Admitting: Internal Medicine

## 2014-05-21 DIAGNOSIS — Z5189 Encounter for other specified aftercare: Secondary | ICD-10-CM | POA: Diagnosis not present

## 2014-05-23 ENCOUNTER — Encounter (HOSPITAL_COMMUNITY)
Admission: RE | Admit: 2014-05-23 | Discharge: 2014-05-23 | Disposition: A | Discharge status: Home / Self Care | Payer: Managed Care, Other (non HMO) | Source: Ambulatory Visit | Attending: Internal Medicine | Admitting: Internal Medicine

## 2014-05-23 DIAGNOSIS — Z5189 Encounter for other specified aftercare: Secondary | ICD-10-CM | POA: Diagnosis not present

## 2014-05-23 NOTE — Progress Notes (Signed)
Kenneth [name] 48 y.o. male Nutrition Note Spoke with pt.  Nutrition Survey reviewed with pt. Pt is following Step 2 of the Therapeutic Lifestyle Changes diet. Pt reports having food fear since his heart surgery. Pt states, "if I can't find something healthy to eat, I don't eat and I know that's not good either." Pt's fear of food and ways to manage healthy eating habits discussed. Pt wants to lose wt. Pt has been trying to lose wt by tracking his nutrition on http://vang.com/ and using his fitbit. Wt loss tips reviewed. Pt expressed understanding of the information reviewed. Pt aware of nutrition education classes offered and plans on attending nutrition classes.  Nutrition Diagnosis   Food-and nutrition-related knowledge deficit related to lack of exposure to information as related to diagnosis of: ? CVD    Obesity related to excessive energy intake as evidenced by a BMI of 31.2  Nutrition Intervention   Benefits of adopting Therapeutic Lifestyle Changes discussed when Medficts reviewed.   Pt to attend the Portion Distortion class   Pt to attend the  ? Nutrition I class                     ? Nutrition II class   Pt given handouts for: ? Nutrition I class ? Nutrition II class ? 5-day, 1800 kcal menu ideas   Continue client-centered nutrition education by RD, as part of interdisciplinary care.  Goal(s)   Pt to identify food quantities necessary to achieve: ? wt loss to a goal wt of 182-200 lb (83.0-91.2 kg) at graduation from cardiac rehab.   Monitor and Evaluate progress toward nutrition goal with team. Nutrition Risk:  Low   Derek Mound, M.Ed, RD, LDN, CDE 05/23/2014 12:09 PM

## 2014-05-26 ENCOUNTER — Encounter (HOSPITAL_COMMUNITY): Payer: Managed Care, Other (non HMO)

## 2014-05-26 ENCOUNTER — Telehealth (HOSPITAL_COMMUNITY): Payer: Self-pay | Admitting: Internal Medicine

## 2014-05-27 NOTE — Addendum Note (Signed)
Addendum created 05/27/14 0910 by Napoleon Form, MD   Modules edited: Anesthesia Events, Anesthesia LDA

## 2014-05-28 ENCOUNTER — Encounter (HOSPITAL_COMMUNITY)
Admission: RE | Admit: 2014-05-28 | Discharge: 2014-05-28 | Disposition: A | Discharge status: Home / Self Care | Payer: Managed Care, Other (non HMO) | Source: Ambulatory Visit | Attending: Internal Medicine | Admitting: Internal Medicine

## 2014-05-28 DIAGNOSIS — Z951 Presence of aortocoronary bypass graft: Secondary | ICD-10-CM | POA: Insufficient documentation

## 2014-05-28 NOTE — Progress Notes (Signed)
Reviewed home exercise with pt today.  Pt plans to continue walking and form movements from karate at home for exercise.  He also expressed interest in returning to running and using punching bag; O asked him to hold off until his 3 month post-surgery date and for clearance for running. Reviewed THR, pulse, RPE, sign and symptoms, and when to call 911 or MD.  Pt voiced understanding. Alberteen Sam, MA, ACSM RCEP

## 2014-05-30 ENCOUNTER — Encounter (HOSPITAL_COMMUNITY)
Admission: RE | Admit: 2014-05-30 | Discharge: 2014-05-30 | Disposition: A | Discharge status: Home / Self Care | Payer: Managed Care, Other (non HMO) | Source: Ambulatory Visit | Attending: Internal Medicine | Admitting: Internal Medicine

## 2014-06-02 ENCOUNTER — Encounter (HOSPITAL_COMMUNITY): Payer: Managed Care, Other (non HMO)

## 2014-06-04 ENCOUNTER — Encounter (HOSPITAL_COMMUNITY)
Admission: RE | Admit: 2014-06-04 | Discharge: 2014-06-04 | Disposition: A | Discharge status: Home / Self Care | Payer: Managed Care, Other (non HMO) | Source: Ambulatory Visit | Attending: Internal Medicine | Admitting: Internal Medicine

## 2014-06-05 ENCOUNTER — Telehealth: Payer: Self-pay | Admitting: Internal Medicine

## 2014-06-05 NOTE — Telephone Encounter (Signed)
RN spoke to patient . Informed him lab slip for NMR with lipid will be mailed to him.  Lab slip original ordered in 03/2014. Verbalized understanding. RN PLACED IN MAIL.

## 2014-06-05 NOTE — Telephone Encounter (Signed)
Patient has an appointment in July to see Dr. Debara Pickett.  Please send a lab slip so he can have his blood work done prior to his visit.

## 2014-06-06 ENCOUNTER — Encounter (HOSPITAL_COMMUNITY)
Admission: RE | Admit: 2014-06-06 | Discharge: 2014-06-06 | Disposition: A | Discharge status: Home / Self Care | Payer: Managed Care, Other (non HMO) | Source: Ambulatory Visit | Attending: Internal Medicine | Admitting: Internal Medicine

## 2014-06-09 ENCOUNTER — Encounter (HOSPITAL_COMMUNITY): Payer: Managed Care, Other (non HMO)

## 2014-06-11 ENCOUNTER — Encounter (HOSPITAL_COMMUNITY)
Admission: RE | Admit: 2014-06-11 | Discharge: 2014-06-11 | Disposition: A | Discharge status: Home / Self Care | Payer: Managed Care, Other (non HMO) | Source: Ambulatory Visit | Attending: Internal Medicine | Admitting: Internal Medicine

## 2014-06-13 ENCOUNTER — Encounter (HOSPITAL_COMMUNITY): Payer: Managed Care, Other (non HMO)

## 2014-06-16 ENCOUNTER — Encounter (HOSPITAL_COMMUNITY): Payer: Managed Care, Other (non HMO)

## 2014-06-18 ENCOUNTER — Encounter (HOSPITAL_COMMUNITY)
Admission: RE | Admit: 2014-06-18 | Discharge: 2014-06-18 | Disposition: A | Discharge status: Home / Self Care | Payer: Managed Care, Other (non HMO) | Source: Ambulatory Visit | Attending: Internal Medicine | Admitting: Internal Medicine

## 2014-06-20 ENCOUNTER — Encounter (HOSPITAL_COMMUNITY): Payer: Managed Care, Other (non HMO)

## 2014-06-23 ENCOUNTER — Encounter (HOSPITAL_COMMUNITY)
Admission: RE | Admit: 2014-06-23 | Discharge: 2014-06-23 | Disposition: A | Discharge status: Home / Self Care | Payer: Managed Care, Other (non HMO) | Source: Ambulatory Visit | Attending: Internal Medicine | Admitting: Internal Medicine

## 2014-06-25 ENCOUNTER — Encounter (HOSPITAL_COMMUNITY): Payer: Managed Care, Other (non HMO)

## 2014-06-26 NOTE — Telephone Encounter (Signed)
Encounter complete. 

## 2014-06-27 ENCOUNTER — Encounter (HOSPITAL_COMMUNITY): Payer: Managed Care, Other (non HMO)

## 2014-06-30 ENCOUNTER — Encounter (HOSPITAL_COMMUNITY): Payer: Managed Care, Other (non HMO)

## 2014-07-02 ENCOUNTER — Encounter (HOSPITAL_COMMUNITY): Payer: Managed Care, Other (non HMO)

## 2014-07-04 ENCOUNTER — Encounter (HOSPITAL_COMMUNITY): Payer: Managed Care, Other (non HMO)

## 2014-07-07 ENCOUNTER — Encounter (HOSPITAL_COMMUNITY): Payer: Managed Care, Other (non HMO)

## 2014-07-09 ENCOUNTER — Encounter (HOSPITAL_COMMUNITY): Payer: Managed Care, Other (non HMO)

## 2014-07-11 ENCOUNTER — Encounter (HOSPITAL_COMMUNITY): Payer: Managed Care, Other (non HMO)

## 2014-07-14 ENCOUNTER — Encounter (HOSPITAL_COMMUNITY): Payer: Managed Care, Other (non HMO)

## 2014-07-16 ENCOUNTER — Encounter (HOSPITAL_COMMUNITY): Payer: Managed Care, Other (non HMO)

## 2014-07-18 ENCOUNTER — Encounter (HOSPITAL_COMMUNITY): Payer: Managed Care, Other (non HMO)

## 2014-07-21 ENCOUNTER — Encounter (HOSPITAL_COMMUNITY): Payer: Managed Care, Other (non HMO)

## 2014-07-22 LAB — NMR LIPOPROFILE WITH LIPIDS
Cholesterol, Total: 215 mg/dL — ABNORMAL HIGH (ref <200)
HDL Particle Number: 25.2 umol/L — ABNORMAL LOW (ref >=30.5)
HDL SIZE: 8.5 nm — AB (ref >=9.2)
HDL-C: 42 mg/dL (ref >=40)
LDL CALC: 147 mg/dL — AB (ref <100)
LDL Particle Number: 2074 nmol/L — ABNORMAL HIGH (ref <1000)
LDL SIZE: 21.1 nm (ref >20.5)
LP-IR SCORE: 78 — AB (ref <=45)
Large VLDL-P: 6.2 nmol/L — ABNORMAL HIGH (ref <=2.7)
Small LDL Particle Number: 954 nmol/L — ABNORMAL HIGH (ref <=527)
TRIGLYCERIDES: 129 mg/dL (ref <150)
VLDL Size: 57.1 nm — ABNORMAL HIGH (ref <=46.6)

## 2014-07-23 ENCOUNTER — Encounter (HOSPITAL_COMMUNITY): Payer: Managed Care, Other (non HMO)

## 2014-07-23 ENCOUNTER — Ambulatory Visit (INDEPENDENT_AMBULATORY_CARE_PROVIDER_SITE_OTHER): Payer: Managed Care, Other (non HMO) | Admitting: Internal Medicine

## 2014-07-23 ENCOUNTER — Encounter: Payer: Self-pay | Admitting: Internal Medicine

## 2014-07-23 VITALS — BP 118/80 | HR 49 | Ht 69.0 in | Wt 213.9 lb

## 2014-07-23 DIAGNOSIS — E785 Hyperlipidemia, unspecified: Secondary | ICD-10-CM

## 2014-07-23 DIAGNOSIS — I209 Angina pectoris, unspecified: Secondary | ICD-10-CM

## 2014-07-23 DIAGNOSIS — I200 Unstable angina: Secondary | ICD-10-CM

## 2014-07-23 DIAGNOSIS — I25119 Atherosclerotic heart disease of native coronary artery with unspecified angina pectoris: Secondary | ICD-10-CM

## 2014-07-23 DIAGNOSIS — I2510 Atherosclerotic heart disease of native coronary artery without angina pectoris: Secondary | ICD-10-CM

## 2014-07-23 DIAGNOSIS — Z951 Presence of aortocoronary bypass graft: Secondary | ICD-10-CM

## 2014-07-23 DIAGNOSIS — I150 Renovascular hypertension: Secondary | ICD-10-CM

## 2014-07-23 DIAGNOSIS — I151 Hypertension secondary to other renal disorders: Secondary | ICD-10-CM

## 2014-07-23 DIAGNOSIS — N2889 Other specified disorders of kidney and ureter: Secondary | ICD-10-CM

## 2014-07-23 MED ORDER — EZETIMIBE 10 MG PO TABS
10.0000 mg | ORAL_TABLET | Freq: Every day | ORAL | Status: DC
Start: 2014-07-23 — End: 2014-07-23

## 2014-07-23 MED ORDER — EZETIMIBE 10 MG PO TABS: 10.0000 mg | ORAL_TABLET | Freq: Every day | ORAL | Status: DC

## 2014-07-23 NOTE — Patient Instructions (Signed)
Your physician wants you to follow-up in: 6 months. You will receive a reminder letter in the mail two months in advance. If you don't receive a letter, please call our office to schedule the follow-up appointment.  Please start zetia 10mg  once daily.   Please have fasting labs in 6 months - prior to next office visit.

## 2014-07-23 NOTE — Progress Notes (Signed)
OFFICE NOTE  Chief Complaint:  Followup  Primary Care Physician: Tula Nakayama  HPI:  Pryor Guettler is a 48 year old male who had been seen by [place] in consult previously. Dr. Debara Pickett saw him in August 2012. He had been having angina. He reportedly had normal epicardial coronaries at catheterization in [place]. He had an extensive workup there that included cardiac and GI evaluation. He was diagnosed with microvascular angina. He had been maintained on nitroglycerin, which helped. He actually even had EECP in the past which he said helped as well. He recently moved to the area and wanted to get established with a cardiologist. Dr. Debara Pickett recommended resuming his Ranexa. He also resumed his nitroglycerin patch and refilled his amlodipine. No functional studies have been done at our office. He was referred back to Dr. Ronnald Ramp for followup. He reports however several weeks ago he thinks he got out of sync with his medications. He ran out of his nitroglycerin patch and was taking more short acting nitroglycerin. He says reestablished his medicines, but reports that he continues to have some worsening chest pain and shortness of breath. He continues to exercise but is noticing more anginal symptoms earlier in his exercise.  Based on the change in his angina pattern, a stress test was recommended. This was performed on 03/12/2014 which showed a marked LCX territory defect with EF 47%.  This was a high risk study. Cardiac catheterization was recommended and performed the next day by Dr. Claiborne Billings.  This revealed the following:  Low normal to mild LV dysfunction with an ejection fraction of 50% with subtle mild anterolateral hypocontractility.  Significant multivessel coronary obstructive disease with 60-70% LAD stenosis after the first septal perforating artery and 50% mid LAD stenosis, probable chronic occlusion of the left circumflex vessel with collateralization to the distal marginal branch and 95%  stenosis in the first marginal branch with 99% stenosis in its distal segment; and probable previous subtotal stenosis of the proximal RCA with bridging collaterals and evidence for a total distal RCA stenosis with evidence for left to right collaterals.  Based on these findings, CABG was recommended.  He underwent 4 vessel CABG on 03/18/14, as follows:  1. Median Sternotomy 2. Extracorporeal circulation       3.   Coronary artery bypass grafting x 4  Left internal mammary graft to the LAD  SVG to OM  Sequential SVG to PDA and PL       4. Endoscopic vein harvest from the right leg  Mr. Weckwerth is feeling quite well. His energy is improved and his color is better. He reports some mild soreness to his sternal wound but otherwise is recovering nicely. He is scheduled to see Dr. Cyndia Bent on April 22. He has been involved in cardiac rehabilitation has had no issues. He denies any further angina. We did a recheck of his lipid profile today which shows an improvement in his LDL and particle numbers however he is still not at goal. He is on high dose Lipitor.  PMHx:  Past Medical History  Diagnosis Date  . Syndrome X, cardiac   . Hypertension   . Hyperlipidemia   . History of Doppler ultrasound 07/09/2013    carotid doppler; moderate narrowing of both subclavian arteries, with normal carotid arteries    Past Surgical History  Procedure Laterality Date  . Kidney stone surgery  age 23  . Shoulder arthroscopy  2001    L shoulder  . Cardiac catheterization  2005  .  Coronary artery bypass graft N/A 03/18/2014    Procedure: CORONARY ARTERY BYPASS GRAFTING (CABG) x 4 using endoscopically harvested right saphenous vein and left internal mammary artery;  Surgeon: Gaye Pollack, MD;  Location: [facility];  Service: Open Heart Surgery;  Laterality: N/A;  . Intraoperative transesophageal echocardiogram N/A 03/18/2014    Procedure: INTRAOPERATIVE TRANSESOPHAGEAL ECHOCARDIOGRAM;  Surgeon: Gaye Pollack, MD;   Location: [facility] OR;  Service: Open Heart Surgery;  Laterality: N/A;    FAMHx:  Family History  Problem Relation Age of Onset  . Heart attack Father   . Cancer Father     lung  . Heart attack Mother   . Stroke Mother   . Diabetes Mother     SOCHx:   reports that he quit smoking about 2 years ago. His smoking use included Cigars. He has never used smokeless tobacco. He reports that he does not drink alcohol or use illicit drugs.  ALLERGIES:  Allergies  Allergen Reactions  . Tetracyclines & Related Nausea And Vomiting    ROS: A comprehensive review of systems was negative.  HOME MEDS: Current Outpatient Prescriptions  Medication Sig Dispense Refill  . arginine 500 MG tablet Take 500 mg by mouth 2 (two) times daily.      Marland Kitchen aspirin 325 MG EC tablet Take 325 mg by mouth daily.      Marland Kitchen atorvastatin (LIPITOR) 80 MG tablet Take 80 mg by mouth at bedtime.      Marland Kitchen buPROPion (WELLBUTRIN XL) 300 MG 24 hr tablet Take 300 mg by mouth daily.      Marland Kitchen gemfibrozil (LOPID) 600 MG tablet Take 600 mg by mouth 2 (two) times daily before a meal.      . Lecithin 1200 MG CAPS Take 1,200 mg by mouth 2 (two) times daily.      Marland Kitchen lisinopril (PRINIVIL,ZESTRIL) 10 MG tablet Take 10 mg by mouth daily.      . metoprolol tartrate (LOPRESSOR) 25 MG tablet Take 12.5 mg by mouth 2 (two) times daily.       . TESTOSTERONE IM Inject 1.5 mLs into the muscle every 7 (seven) days.       Marland Kitchen zolpidem (AMBIEN) 10 MG tablet Take 10 mg by mouth at bedtime as needed for sleep.      Marland Kitchen ezetimibe (ZETIA) 10 MG tablet Take 1 tablet (10 mg total) by mouth daily.  29 tablet  0   No current facility-administered medications for this visit.    LABS/IMAGING: No results found for this or any previous visit (from the past 48 hour(s)). No results found.  VITALS: BP 118/80  Pulse 49  Ht 5\' 9"  (1.753 m)  Wt 213 lb 14.4 oz (97.024 kg)  BMI 31.57 kg/m2  EXAM: General appearance: alert and no distress Neck: no adenopathy, no JVD,  supple, symmetrical, trachea midline, thyroid not enlarged, symmetric, no tenderness/mass/nodules and Soft bruit at the left neck base Lungs: clear to auscultation bilaterally Heart: regular rate and rhythm, S1, S2 normal, no murmur, click, rub or gallop Abdomen: soft, non-tender; bowel sounds normal; no masses,  no organomegaly Extremities: extremities normal, atraumatic, no cyanosis or edema Pulses: 2+ and symmetric Skin: Skin color, texture, turgor normal. No rashes or lesions Neurologic: Grossly normal  EKG: Sinus bradycardia at 49  ASSESSMENT: 1. CAD s/p 4 vessel CABG (LIMA to LAD, SVG to OM, and sequential SVG to PDA/PLB) - 02/2014 2. Hypertension- at goal 3. Hyperlipidemia - not at goal 4. Obesity - improving  PLAN:  1.   Mr. Cruey is markedly improved after bypass surgery. He continues to exercise and go to cardiac rehabilitation. He's had no further chest pain. His blood pressure has been well controlled. His heart rate is low normal today and we may need to back off on his beta blocker. With regards to his cholesterol there has been a marked improvement with over 50% reduction in LDL particle numbers as well as LDL content however he is still not at goal LDL. He is on high-dose Lipitor 80 mg. I would like to add Zetia 10 mg daily to his current regimen. We'll plan to recheck his lipid profile in 6 months. He has not reached goal, he may be a good candidate for a PCSK9 inhibitor.  Pixie Casino, MD, [facility] Attending Cardiologist [facility] 07/23/2014, 10:07 AM

## 2014-07-25 ENCOUNTER — Encounter (HOSPITAL_COMMUNITY): Payer: Managed Care, Other (non HMO)

## 2014-07-28 ENCOUNTER — Encounter (HOSPITAL_COMMUNITY): Payer: Managed Care, Other (non HMO)

## 2014-07-30 ENCOUNTER — Encounter (HOSPITAL_COMMUNITY): Admission: RE | Admit: 2014-07-30 | Payer: Managed Care, Other (non HMO) | Source: Ambulatory Visit

## 2014-07-30 ENCOUNTER — Telehealth (HOSPITAL_COMMUNITY): Payer: Self-pay | Admitting: *Deleted

## 2014-07-30 NOTE — Telephone Encounter (Signed)
Message left inquiring on when pt thought he might be able to return to rehab.  Or if at  this point he needed to needed to discontinue from the program to care for his wife.  Contact information left. Cherre Huger, BSN

## 2014-08-01 ENCOUNTER — Encounter (HOSPITAL_COMMUNITY): Payer: Managed Care, Other (non HMO)

## 2014-08-03 ENCOUNTER — Other Ambulatory Visit: Payer: Self-pay | Admitting: Physician Assistant

## 2014-08-04 ENCOUNTER — Encounter (HOSPITAL_COMMUNITY): Payer: Managed Care, Other (non HMO)

## 2014-08-05 ENCOUNTER — Telehealth: Payer: Self-pay | Admitting: Internal Medicine

## 2014-08-05 MED ORDER — LISINOPRIL 10 MG PO TABS: 10.0000 mg | ORAL_TABLET | Freq: Every day | ORAL | Status: DC

## 2014-08-05 NOTE — Telephone Encounter (Signed)
Rx was sent to pharmacy electronically. 

## 2014-08-05 NOTE — Telephone Encounter (Signed)
Ashlee(pharmacist) called in needing an Dr. Debara Pickett to approve Kenneth [name]'s rx for lisinopril. Please call  Thanks

## 2014-08-06 ENCOUNTER — Encounter (HOSPITAL_COMMUNITY): Payer: Managed Care, Other (non HMO)

## 2014-08-06 ENCOUNTER — Other Ambulatory Visit: Payer: Self-pay | Admitting: Internal Medicine

## 2014-08-06 NOTE — Telephone Encounter (Signed)
Rx was sent to pharmacy electronically. 

## 2014-08-08 ENCOUNTER — Encounter (HOSPITAL_COMMUNITY): Payer: Managed Care, Other (non HMO)

## 2014-08-11 ENCOUNTER — Encounter (HOSPITAL_COMMUNITY): Payer: Managed Care, Other (non HMO)

## 2014-08-13 ENCOUNTER — Encounter (HOSPITAL_COMMUNITY): Payer: Managed Care, Other (non HMO)

## 2014-08-15 ENCOUNTER — Encounter (HOSPITAL_COMMUNITY): Payer: Managed Care, Other (non HMO)

## 2014-08-18 ENCOUNTER — Encounter (HOSPITAL_COMMUNITY): Payer: Managed Care, Other (non HMO)

## 2014-08-20 ENCOUNTER — Encounter (HOSPITAL_COMMUNITY): Payer: Managed Care, Other (non HMO)

## 2014-08-21 ENCOUNTER — Other Ambulatory Visit: Payer: Self-pay | Admitting: Internal Medicine

## 2014-08-21 NOTE — Telephone Encounter (Signed)
Rx was sent to pharmacy electronically. 

## 2014-08-22 ENCOUNTER — Encounter (HOSPITAL_COMMUNITY): Payer: Managed Care, Other (non HMO)

## 2014-08-25 ENCOUNTER — Encounter (HOSPITAL_COMMUNITY): Payer: Managed Care, Other (non HMO)

## 2014-08-27 ENCOUNTER — Encounter (HOSPITAL_COMMUNITY): Payer: Managed Care, Other (non HMO)

## 2014-08-29 ENCOUNTER — Encounter (HOSPITAL_COMMUNITY): Payer: Managed Care, Other (non HMO)

## 2014-09-03 ENCOUNTER — Encounter (HOSPITAL_COMMUNITY): Payer: Managed Care, Other (non HMO)

## 2014-09-05 ENCOUNTER — Encounter (HOSPITAL_COMMUNITY): Payer: Managed Care, Other (non HMO)

## 2014-09-08 ENCOUNTER — Encounter (HOSPITAL_COMMUNITY): Payer: Managed Care, Other (non HMO)

## 2014-09-10 ENCOUNTER — Encounter (HOSPITAL_COMMUNITY): Payer: Managed Care, Other (non HMO)

## 2014-09-12 ENCOUNTER — Encounter (HOSPITAL_COMMUNITY): Payer: Managed Care, Other (non HMO)

## 2014-10-15 ENCOUNTER — Other Ambulatory Visit: Payer: Self-pay | Admitting: Internal Medicine

## 2014-10-16 NOTE — Telephone Encounter (Signed)
Pt called in stating that he would like a refill on his Ambien. Please call  Thanks

## 2014-10-17 NOTE — Telephone Encounter (Signed)
Pt still have not received his Ambien.Would you please call today,he needs it for the weekend. Please call this to [facility].

## 2014-12-04 ENCOUNTER — Encounter (HOSPITAL_COMMUNITY): Payer: Self-pay | Admitting: Cardiovascular Disease

## 2015-01-19 ENCOUNTER — Ambulatory Visit: Payer: Managed Care, Other (non HMO) | Admitting: Internal Medicine

## 2015-01-20 ENCOUNTER — Other Ambulatory Visit: Payer: Self-pay | Admitting: Cardiovascular Disease

## 2015-01-20 NOTE — Telephone Encounter (Signed)
Rx(s) sent to pharmacy electronically.  

## 2015-04-03 ENCOUNTER — Other Ambulatory Visit: Payer: Self-pay | Admitting: Internal Medicine

## 2015-04-03 NOTE — Telephone Encounter (Signed)
Phoned in Rx refill for [facility]

## 2015-04-30 ENCOUNTER — Ambulatory Visit: Payer: Managed Care, Other (non HMO) | Admitting: Internal Medicine

## 2015-05-18 ENCOUNTER — Other Ambulatory Visit: Payer: Self-pay | Admitting: Internal Medicine

## 2015-05-18 NOTE — Telephone Encounter (Signed)
Rx(s) sent to pharmacy electronically. OV 05/29/15

## 2015-05-29 ENCOUNTER — Ambulatory Visit: Payer: Managed Care, Other (non HMO) | Admitting: Internal Medicine

## 2015-06-19 ENCOUNTER — Other Ambulatory Visit: Payer: Self-pay | Admitting: Cardiovascular Disease

## 2015-06-19 NOTE — Telephone Encounter (Signed)
Metoprolol already refilled 06/19/15

## 2015-06-19 NOTE — Telephone Encounter (Signed)
REFILL 

## 2015-07-11 ENCOUNTER — Other Ambulatory Visit: Payer: Self-pay | Admitting: Cardiovascular Disease

## 2015-07-24 ENCOUNTER — Other Ambulatory Visit: Payer: Self-pay | Admitting: Internal Medicine

## 2015-07-24 NOTE — Telephone Encounter (Signed)
REFILL 

## 2015-08-08 ENCOUNTER — Other Ambulatory Visit: Payer: Self-pay | Admitting: Cardiovascular Disease

## 2015-08-10 ENCOUNTER — Other Ambulatory Visit: Payer: Self-pay | Admitting: Internal Medicine

## 2015-08-10 NOTE — Telephone Encounter (Signed)
Rx(s) sent to pharmacy electronically.  

## 2015-08-10 NOTE — Telephone Encounter (Signed)
REFILL 

## 2015-08-18 ENCOUNTER — Telehealth: Payer: Self-pay | Admitting: Internal Medicine

## 2015-08-18 DIAGNOSIS — E785 Hyperlipidemia, unspecified: Secondary | ICD-10-CM

## 2015-08-18 DIAGNOSIS — Z79899 Other long term (current) drug therapy: Secondary | ICD-10-CM

## 2015-08-18 DIAGNOSIS — E669 Obesity, unspecified: Secondary | ICD-10-CM

## 2015-08-18 NOTE — Telephone Encounter (Signed)
Pt called in stating that Dr. Debara Pickett will more thank likely want labs to be done before he comes in to his appt on 8/25. He would like to be contacted once those orders have been placed.   Thanks

## 2015-08-18 NOTE — Telephone Encounter (Signed)
Left message on patient 's voice mail - lab order has been sent to [facility] lab. Any question may call back.

## 2015-08-19 LAB — HEPATIC FUNCTION PANEL
ALBUMIN: 4.4 g/dL (ref 3.6–5.1)
ALT: 18 U/L (ref 9–46)
AST: 26 U/L (ref 10–35)
Alkaline Phosphatase: 45 U/L (ref 40–115)
BILIRUBIN DIRECT: 0.1 mg/dL (ref <=0.2)
BILIRUBIN TOTAL: 0.4 mg/dL (ref 0.2–1.2)
Indirect Bilirubin: 0.3 mg/dL (ref 0.2–1.2)
Total Protein: 8 g/dL (ref 6.1–8.1)

## 2015-08-20 ENCOUNTER — Ambulatory Visit (INDEPENDENT_AMBULATORY_CARE_PROVIDER_SITE_OTHER): Payer: Managed Care, Other (non HMO) | Admitting: Internal Medicine

## 2015-08-20 DIAGNOSIS — E785 Hyperlipidemia, unspecified: Secondary | ICD-10-CM

## 2015-08-20 DIAGNOSIS — I10 Essential (primary) hypertension: Secondary | ICD-10-CM | POA: Diagnosis not present

## 2015-08-20 DIAGNOSIS — Z951 Presence of aortocoronary bypass graft: Secondary | ICD-10-CM | POA: Diagnosis not present

## 2015-08-20 NOTE — Patient Instructions (Signed)
Your physician wants you to follow-up in: 1 YEAR with Dr. Debara Pickett. You will receive a reminder letter in the mail two months in advance. If you don't receive a letter, please call our office to schedule the follow-up appointment.

## 2015-08-21 ENCOUNTER — Encounter: Payer: Self-pay | Admitting: Internal Medicine

## 2015-08-21 LAB — NMR LIPOPROFILE WITH LIPIDS
Cholesterol, Total: 184 mg/dL (ref 100–199)
HDL Particle Number: 17.8 umol/L — ABNORMAL LOW (ref >=30.5)
HDL Size: 8.4 nm — ABNORMAL LOW (ref >=9.2)
HDL-C: 28 mg/dL — ABNORMAL LOW (ref >39)
LARGE VLDL-P: 2 nmol/L (ref <=2.7)
LDL (calc): 127 mg/dL — ABNORMAL HIGH (ref 0–99)
LDL PARTICLE NUMBER: 1687 nmol/L — AB (ref <1000)
LDL SIZE: 21.2 nm (ref >=20.8)
LP-IR SCORE: 54 — AB (ref <=45)
SMALL LDL PARTICLE NUMBER: 666 nmol/L — AB (ref <=527)
TRIGLYCERIDES: 143 mg/dL (ref 0–149)
VLDL Size: 39.9 nm (ref <=46.6)

## 2015-08-21 NOTE — Progress Notes (Signed)
OFFICE NOTE  Chief Complaint:  Followup  Primary Care Physician: Tula Nakayama  HPI:  Kenneth [name] is a 49 year old male who had been seen by [place] in consult previously. Dr. Debara Pickett saw him in August 2012. He had been having angina. He reportedly had normal epicardial coronaries at catheterization in [place]. He had an extensive workup there that included cardiac and GI evaluation. He was diagnosed with microvascular angina. He had been maintained on nitroglycerin, which helped. He actually even had EECP in the past which he said helped as well. He recently moved to the area and wanted to get established with a cardiologist. Dr. Debara Pickett recommended resuming his Ranexa. He also resumed his nitroglycerin patch and refilled his amlodipine. No functional studies have been done at our office. He was referred back to Dr. Ronnald Ramp for followup. He reports however several weeks ago he thinks he got out of sync with his medications. He ran out of his nitroglycerin patch and was taking more short acting nitroglycerin. He says reestablished his medicines, but reports that he continues to have some worsening chest pain and shortness of breath. He continues to exercise but is noticing more anginal symptoms earlier in his exercise.  Based on the change in his angina pattern, a stress test was recommended. This was performed on 03/12/2014 which showed a marked LCX territory defect with EF 47%.  This was a high risk study. Cardiac catheterization was recommended and performed the next day by Dr. Claiborne Billings.  This revealed the following:  Low normal to mild LV dysfunction with an ejection fraction of 50% with subtle mild anterolateral hypocontractility.  Significant multivessel coronary obstructive disease with 60-70% LAD stenosis after the first septal perforating artery and 50% mid LAD stenosis, probable chronic occlusion of the left circumflex vessel with collateralization to the distal marginal branch and 95%  stenosis in the first marginal branch with 99% stenosis in its distal segment; and probable previous subtotal stenosis of the proximal RCA with bridging collaterals and evidence for a total distal RCA stenosis with evidence for left to right collaterals.  Based on these findings, CABG was recommended.  He underwent 4 vessel CABG on 03/18/14, as follows:  1. Median Sternotomy 2. Extracorporeal circulation       3.   Coronary artery bypass grafting x 4  Left internal mammary graft to the LAD  SVG to OM  Sequential SVG to PDA and PL       4. Endoscopic vein harvest from the right leg  [name] is feeling quite well. His energy is improved and his color is better. He reports some mild soreness to his sternal wound but otherwise is recovering nicely. He is scheduled to see Dr. Cyndia Bent on April 22. He has been involved in cardiac rehabilitation has had no issues. He denies any further angina. We did a recheck of his lipid profile today which shows an improvement in his LDL and particle numbers however he is still not at goal. He is on high dose Lipitor.  I saw [name] back in the office today. Overall he is doing extremely well. He is back to normal activities. He's not required any nitrates. Cholesterol still been elevated he is due for recheck to see if the addition is that he had to atorvastatin has gotten into his goal cholesterol. He is also on gemfibrozil. Blood pressure is very well controlled today. A couple weeks ago he had a dizzy spell but he felt that this was related to  taking Vybrid.  PMHx:  Past Medical History  Diagnosis Date  . Syndrome X, cardiac   . Hypertension   . Hyperlipidemia   . History of Doppler ultrasound 07/09/2013    carotid doppler; moderate narrowing of both subclavian arteries, with normal carotid arteries    Past Surgical History  Procedure Laterality Date  . Kidney stone surgery  age 56  . Shoulder arthroscopy  2001    L shoulder  . Cardiac  catheterization  2005  . Coronary artery bypass graft N/A 03/18/2014    Procedure: CORONARY ARTERY BYPASS GRAFTING (CABG) x 4 using endoscopically harvested right saphenous vein and left internal mammary artery;  Surgeon: Gaye Pollack, MD;  Location: [facility];  Service: Open Heart Surgery;  Laterality: N/A;  . Intraoperative transesophageal echocardiogram N/A 03/18/2014    Procedure: INTRAOPERATIVE TRANSESOPHAGEAL ECHOCARDIOGRAM;  Surgeon: Gaye Pollack, MD;  Location: [facility] OR;  Service: Open Heart Surgery;  Laterality: N/A;  . Left heart catheterization with coronary angiogram N/A 03/14/2014    Procedure: LEFT HEART CATHETERIZATION WITH CORONARY ANGIOGRAM;  Surgeon: Troy Sine, MD;  Location: [facility] CATH LAB;  Service: Cardiovascular;  Laterality: N/A;    FAMHx:  Family History  Problem Relation Age of Onset  . Heart attack Father   . Cancer Father     lung  . Heart attack Mother   . Stroke Mother   . Diabetes Mother     SOCHx:   reports that he quit smoking about 3 years ago. His smoking use included Cigars. He has never used smokeless tobacco. He reports that he does not drink alcohol or use illicit drugs.  ALLERGIES:  Allergies  Allergen Reactions  . Tetracyclines & Related Nausea And Vomiting    ROS: A comprehensive review of systems was negative.  HOME MEDS: Current Outpatient Prescriptions  Medication Sig Dispense Refill  . aspirin EC 325 MG tablet TAKE 1 TABLET BY MOUTH EVERY DAY 30 tablet 0  . atorvastatin (LIPITOR) 80 MG tablet TAKE 1 TABLET AT BEDTIME 90 tablet 3  . buPROPion (WELLBUTRIN XL) 300 MG 24 hr tablet Take 300 mg by mouth daily.    Marland Kitchen gemfibrozil (LOPID) 600 MG tablet TAKE 1 TABLET BY MOUTH TWICE A DAY 60 tablet 10  . lisinopril (PRINIVIL,ZESTRIL) 10 MG tablet TAKE 1 TABLET BY MOUTH EVERY DAY 30 tablet 10  . metoprolol tartrate (LOPRESSOR) 25 MG tablet TAKE 1 TABLET BY MOUTH TWICE A DAY 30 tablet 0  . TESTOSTERONE IM Inject 1.5 mLs into the muscle every 7  (seven) days.     Marland Kitchen ZETIA 10 MG tablet TAKE 1 TABLET EVERY DAY 30 tablet 10  . zolpidem (AMBIEN) 10 MG tablet take 1 tablet by mouth at bedtime if needed for sleep 30 tablet 5   No current facility-administered medications for this visit.    LABS/IMAGING: No results found for this or any previous visit (from the past 48 hour(s)). No results found.  VITALS: BP 120/76 mmHg  Pulse 79  Ht 5\' 9"  (1.753 m)  Wt 231 lb (104.781 kg)  BMI 34.10 kg/m2  EXAM: General appearance: alert and no distress Neck: no adenopathy, no JVD, supple, symmetrical, trachea midline, thyroid not enlarged, symmetric, no tenderness/mass/nodules and Soft bruit at the left neck base Lungs: clear to auscultation bilaterally Heart: regular rate and rhythm, S1, S2 normal, no murmur, click, rub or gallop Abdomen: soft, non-tender; bowel sounds normal; no masses,  no organomegaly Extremities: extremities normal, atraumatic, no cyanosis or edema Pulses:  2+ and symmetric Skin: Skin color, texture, turgor normal. No rashes or lesions Neurologic: Grossly normal  EKG: Normal sinus rhythm at 79  ASSESSMENT: 1. CAD s/p 4 vessel CABG (LIMA to LAD, SVG to OM, and sequential SVG to PDA/PLB) - 02/2014 2. Hypertension- at goal 3. Hyperlipidemia - not at goal 4. Obesity - improving  PLAN: 1.   Mr. Quezada is markedly improved after bypass surgery. He is graduated from cardiac rehabilitation. Overall he is asymptomatic. He is due for recheck of his cholesterol which we'll obtain. No medication changes are necessary at this time. Plan to see him back annually or sooner as necessary.  Pixie Casino, MD, [facility] Attending Cardiologist [place] 08/21/2015, 11:26 AM

## 2015-08-26 ENCOUNTER — Other Ambulatory Visit: Payer: Self-pay | Admitting: Cardiovascular Disease

## 2015-08-26 NOTE — Telephone Encounter (Signed)
Rx(s) sent to pharmacy electronically.  

## 2015-09-09 ENCOUNTER — Other Ambulatory Visit: Payer: Self-pay | Admitting: Internal Medicine

## 2015-09-10 ENCOUNTER — Other Ambulatory Visit: Payer: Self-pay | Admitting: Internal Medicine

## 2015-09-10 NOTE — Telephone Encounter (Signed)
Rx(s) sent to pharmacy electronically.  

## 2015-09-22 ENCOUNTER — Other Ambulatory Visit: Payer: Self-pay | Admitting: Internal Medicine

## 2015-09-22 NOTE — Telephone Encounter (Signed)
Rx request sent to pharmacy.  

## 2015-10-12 ENCOUNTER — Other Ambulatory Visit: Payer: Self-pay | Admitting: Internal Medicine

## 2015-10-12 NOTE — Telephone Encounter (Signed)
Rx request sent to pharmacy.  

## 2015-11-05 ENCOUNTER — Telehealth: Payer: Self-pay | Admitting: Internal Medicine

## 2015-11-05 MED ORDER — ASPIRIN EC 325 MG PO TBEC: 325.0000 mg | DELAYED_RELEASE_TABLET | Freq: Every day | ORAL | Status: DC

## 2015-11-05 NOTE — Telephone Encounter (Signed)
Pharmacy tech called in wanting to get a verbal order for this pt.   Thanks

## 2015-11-05 NOTE — Telephone Encounter (Signed)
Received call from [facility] pharmacy tech.Stated he received a E script for aspirin needed to clarify order.Advised patient takes ec aspirin 325 mg daily.

## 2016-01-26 ENCOUNTER — Encounter: Payer: Self-pay | Admitting: Internal Medicine

## 2016-01-26 ENCOUNTER — Ambulatory Visit (INDEPENDENT_AMBULATORY_CARE_PROVIDER_SITE_OTHER): Payer: Managed Care, Other (non HMO) | Admitting: Internal Medicine

## 2016-01-26 VITALS — BP 122/74 | HR 63 | Ht 69.0 in | Wt 223.6 lb

## 2016-01-26 DIAGNOSIS — I10 Essential (primary) hypertension: Secondary | ICD-10-CM

## 2016-01-26 DIAGNOSIS — Z951 Presence of aortocoronary bypass graft: Secondary | ICD-10-CM | POA: Diagnosis not present

## 2016-01-26 DIAGNOSIS — E669 Obesity, unspecified: Secondary | ICD-10-CM

## 2016-01-26 DIAGNOSIS — E785 Hyperlipidemia, unspecified: Secondary | ICD-10-CM | POA: Diagnosis not present

## 2016-01-26 DIAGNOSIS — R079 Chest pain, unspecified: Secondary | ICD-10-CM | POA: Diagnosis not present

## 2016-01-26 DIAGNOSIS — R072 Precordial pain: Secondary | ICD-10-CM

## 2016-01-26 DIAGNOSIS — F32A Depression, unspecified: Secondary | ICD-10-CM

## 2016-01-26 DIAGNOSIS — Z01810 Encounter for preprocedural cardiovascular examination: Secondary | ICD-10-CM

## 2016-01-26 DIAGNOSIS — I2510 Atherosclerotic heart disease of native coronary artery without angina pectoris: Secondary | ICD-10-CM

## 2016-01-26 DIAGNOSIS — F329 Major depressive disorder, single episode, unspecified: Secondary | ICD-10-CM

## 2016-01-26 NOTE — Progress Notes (Signed)
OFFICE NOTE  Chief Complaint:  Followup, some chest pain  Primary Care Physician: Tula Nakayama  HPI:  Kenneth [name] is a 50 year old male who had been seen by [place] in consult previously. Dr. Debara Pickett saw him in August 2012. He had been having angina. He reportedly had normal epicardial coronaries at catheterization in [place]. He had an extensive workup there that included cardiac and GI evaluation. He was diagnosed with microvascular angina. He had been maintained on nitroglycerin, which helped. He actually even had EECP in the past which he said helped as well. He recently moved to the area and wanted to get established with a cardiologist. Dr. Debara Pickett recommended resuming his Ranexa. He also resumed his nitroglycerin patch and refilled his amlodipine. No functional studies have been done at our office. He was referred back to Dr. Ronnald Ramp for followup. He reports however several weeks ago he thinks he got out of sync with his medications. He ran out of his nitroglycerin patch and was taking more short acting nitroglycerin. He says reestablished his medicines, but reports that he continues to have some worsening chest pain and shortness of breath. He continues to exercise but is noticing more anginal symptoms earlier in his exercise.  Based on the change in his angina pattern, a stress test was recommended. This was performed on 03/12/2014 which showed a marked LCX territory defect with EF 47%.  This was a high risk study. Cardiac catheterization was recommended and performed the next day by Dr. Claiborne Billings.  This revealed the following:  Low normal to mild LV dysfunction with an ejection fraction of 50% with subtle mild anterolateral hypocontractility.  Significant multivessel coronary obstructive disease with 60-70% LAD stenosis after the first septal perforating artery and 50% mid LAD stenosis, probable chronic occlusion of the left circumflex vessel with collateralization to the distal marginal  branch and 95% stenosis in the first marginal branch with 99% stenosis in its distal segment; and probable previous subtotal stenosis of the proximal RCA with bridging collaterals and evidence for a total distal RCA stenosis with evidence for left to right collaterals.  Based on these findings, CABG was recommended.  He underwent 4 vessel CABG on 03/18/14, as follows:  1. Median Sternotomy 2. Extracorporeal circulation       3.   Coronary artery bypass grafting x 4  Left internal mammary graft to the LAD  SVG to OM  Sequential SVG to PDA and PL       4. Endoscopic vein harvest from the right leg  Kenneth [name] is feeling quite well. His energy is improved and his color is better. He reports some mild soreness to his sternal wound but otherwise is recovering nicely. He is scheduled to see Dr. Cyndia Bent on April 22. He has been involved in cardiac rehabilitation has had no issues. He denies any further angina. We did a recheck of his lipid profile today which shows an improvement in his LDL and particle numbers however he is still not at goal. He is on high dose Lipitor.  I saw Kenneth [name] back in the office today. Overall he is doing extremely well. He is back to normal activities. He's not required any nitrates. Cholesterol still been elevated he is due for recheck to see if the addition is that he had to atorvastatin has gotten into his goal cholesterol. He is also on gemfibrozil. Blood pressure is very well controlled today. A couple weeks ago he had a dizzy spell but he felt that this  was related to taking Vybrid.  Kenneth [name] returns today for follow-up. It's come to my attention that he fractured his left hand apparently while hitting a door. After further questioning he's been under significant stress and may be dealing with some depression at home. His wife is also concerned about some substance abuse and perhaps abusing his Ambien. We have been prescribing that for him, but there were times  where she saw him taking 10 or 12 Ambien at a time. He apparently is been having some hallucinations and sleep talking as well as sleepwalking. He has been on Wellbutrin but does not feel that it's helping him much. He reports he and his wife have been getting some counseling. He is apparently scheduled to have surgery on his left arm but is been reporting some chest pain. He says it comes on somewhat with exertion and is relieved by rest. His EKG today shows sinus rhythm with inferior T-wave changes concerning for ischemia. These are slightly different than his prior EKG. As mentioned he had bypass surgery almost 2 years ago.  PMHx:  Past Medical History  Diagnosis Date  . Syndrome X, cardiac   . Hypertension   . Hyperlipidemia   . History of Doppler ultrasound 07/09/2013    carotid doppler; moderate narrowing of both subclavian arteries, with normal carotid arteries    Past Surgical History  Procedure Laterality Date  . Kidney stone surgery  age 37  . Shoulder arthroscopy  2001    L shoulder  . Cardiac catheterization  2005  . Coronary artery bypass graft N/A 03/18/2014    Procedure: CORONARY ARTERY BYPASS GRAFTING (CABG) x 4 using endoscopically harvested right saphenous vein and left internal mammary artery;  Surgeon: Gaye Pollack, MD;  Location: [facility] OR;  Service: Open Heart Surgery;  Laterality: N/A;  . Intraoperative transesophageal echocardiogram N/A 03/18/2014    Procedure: INTRAOPERATIVE TRANSESOPHAGEAL ECHOCARDIOGRAM;  Surgeon: Gaye Pollack, MD;  Location: [facility] OR;  Service: Open Heart Surgery;  Laterality: N/A;  . Left heart catheterization with coronary angiogram N/A 03/14/2014    Procedure: LEFT HEART CATHETERIZATION WITH CORONARY ANGIOGRAM;  Surgeon: Troy Sine, MD;  Location: [facility] CATH LAB;  Service: Cardiovascular;  Laterality: N/A;    FAMHx:  Family History  Problem Relation Age of Onset  . Heart attack Father   . Cancer Father     lung  . Heart attack Mother   .  Stroke Mother   . Diabetes Mother     SOCHx:   reports that he quit smoking about 3 years ago. His smoking use included Cigars. He has never used smokeless tobacco. He reports that he does not drink alcohol or use illicit drugs.  ALLERGIES:  Allergies  Allergen Reactions  . Tetracyclines & Related Nausea And Vomiting    ROS: A comprehensive review of systems was negative except for: Cardiovascular: positive for chest pain Musculoskeletal: positive for Left hand pain  HOME MEDS: Current Outpatient Prescriptions  Medication Sig Dispense Refill  . aspirin EC 325 MG tablet Take 1 tablet (325 mg total) by mouth daily. 30 tablet 6  . atorvastatin (LIPITOR) 80 MG tablet Take 1 tablet (80 mg total) by mouth at bedtime. 90 tablet 3  . buPROPion (WELLBUTRIN XL) 300 MG 24 hr tablet Take 300 mg by mouth daily.    Marland Kitchen gemfibrozil (LOPID) 600 MG tablet Take 1 tablet (600 mg total) by mouth 2 (two) times daily. 60 tablet 11  . lisinopril (PRINIVIL,ZESTRIL) 10 MG tablet  TAKE 1 TABLET BY MOUTH EVERY DAY 30 tablet 10  . metoprolol tartrate (LOPRESSOR) 25 MG tablet TAKE 1 TABLET BY MOUTH TWICE A DAY 60 tablet 11  . TESTOSTERONE IM Inject 1.5 mLs into the muscle every 7 (seven) days.     Marland Kitchen ZETIA 10 MG tablet TAKE 1 TABLET EVERY DAY 30 tablet 10   No current facility-administered medications for this visit.    LABS/IMAGING: No results found for this or any previous visit (from the past 48 hour(s)). No results found.  VITALS: BP 122/74 mmHg  Pulse 63  Ht 5\' 9"  (1.753 m)  Wt 223 lb 9.6 oz (101.424 kg)  BMI 33.00 kg/m2  EXAM: General appearance: alert and no distress Neck: no JVD, supple, symmetrical, trachea midline, thyroid not enlarged, symmetric, no tenderness/mass/nodules and Soft bruit at the left neck base Lungs: clear to auscultation bilaterally Heart: regular rate and rhythm, S1, S2 normal, no murmur, click, rub or gallop Abdomen: soft, non-tender; bowel sounds normal; no masses,  no  organomegaly Extremities: Left forearm and hand are in a cast Pulses: 2+ and symmetric Skin: Skin color, texture, turgor normal. No rashes or lesions Neurologic: Grossly normal  EKG: Normal sinus rhythm at 63, rightward axis, inferior T-wave inversions suggesting possible ischemia  ASSESSMENT: 1. Chest pain, concerning for possible unstable angina 2. CAD s/p 4 vessel CABG (LIMA to LAD, SVG to OM, and sequential SVG to PDA/PLB) - 02/2014 3. Hypertension- at goal 4. Hyperlipidemia - not at goal 5. Obesity - improving 6. Depression 7. Possible substance abuse-including Ambien 8. Indeterminate risk for surgery  PLAN: 1.   Kenneth [name] is now describing angina again. Symptoms are similar to prior to his surgery but not as severe. He is concerned about a new blockage. His EKG shows some mild T-wave inversions. He is scheduled to have elective surgery to repair a fracture in his left hand which was caused by him smashing his hand against a door. I suspect this may been due to an argument with his wife. She said that there some marital issues that they're working out. She is also concerned about him abusing Ambien and possibly other substances. He has requested that we not provide him any more Ambien and we will not prescribe that. I will go ahead and provide a referral to [facility] for him as I think he would benefit from counseling, psychotherapy and perhaps medication. We will try to schedule stress test later this week. If it's low risk that he could proceed with surgery, but it's abnormal that he will need a repeat catheterization.  Plan follow-up with me after the results are available  Pixie Casino, MD, [facility] Attending Cardiologist [facility] 01/26/2016, 6:07 PM

## 2016-01-26 NOTE — Patient Instructions (Signed)
Your physician has requested that you have a lexiscan myoview. For further information please visit [email or web address]. Please follow instruction sheet, as given.  Dr Debara Pickett recommends that you schedule a follow-up appointment in 2 weeks (okay to double book).

## 2016-01-27 ENCOUNTER — Telehealth: Payer: Self-pay

## 2016-01-27 NOTE — Telephone Encounter (Signed)
Request for surgical clearance:  1. What type of surgery is being performed? Left Middle Finger: left long and ring finger closed reduction and pinning, possible ORIF, Perc pinning metacarpal fx, each, Left Ring Finger: Perc pinning metacarpal fx, each, ORIF metacarpal each bone  2. When is this surgery scheduled? 01/28/2016 - Dr Apolonio Schneiders wants to do the surgery TOMORROW afternoon - changed from general anesthesia to axiallary block   3. Are there any medications that need to be held prior to surgery and how long? NA - patient is on full strength aspirin    4. Name of physician performing surgery? Dr Gavin Pound at [place]. What is your office phone and fax number?  Phone - [phone]  Fax - [phone]

## 2016-01-27 NOTE — Telephone Encounter (Signed)
He was describing chest pain symptoms concerning for unstable angina to me in the office. I feel that he would be intermediate to high risk for surgery - certainly, his risk would be lower with a regional block. If there is urgency to do the surgery for concern of poor healing or other reasons, [name] and Dr. Caralyn Guile would need to accept the greater possibility of perioperative cardiac events. I would still recommend stress testing prior.  Dr. Debara Pickett

## 2016-01-28 ENCOUNTER — Ambulatory Visit (HOSPITAL_COMMUNITY)
Admission: RE | Admit: 2016-01-28 | Discharge: 2016-01-28 | Disposition: A | Discharge status: Home / Self Care | Payer: Managed Care, Other (non HMO) | Source: Ambulatory Visit | Attending: Internal Medicine | Admitting: Internal Medicine

## 2016-01-28 DIAGNOSIS — Z6833 Body mass index (BMI) 33.0-33.9, adult: Secondary | ICD-10-CM | POA: Insufficient documentation

## 2016-01-28 DIAGNOSIS — I10 Essential (primary) hypertension: Secondary | ICD-10-CM | POA: Insufficient documentation

## 2016-01-28 DIAGNOSIS — R0602 Shortness of breath: Secondary | ICD-10-CM | POA: Insufficient documentation

## 2016-01-28 DIAGNOSIS — I517 Cardiomegaly: Secondary | ICD-10-CM | POA: Diagnosis not present

## 2016-01-28 DIAGNOSIS — R0609 Other forms of dyspnea: Secondary | ICD-10-CM | POA: Insufficient documentation

## 2016-01-28 DIAGNOSIS — R079 Chest pain, unspecified: Secondary | ICD-10-CM

## 2016-01-28 DIAGNOSIS — E669 Obesity, unspecified: Secondary | ICD-10-CM | POA: Insufficient documentation

## 2016-01-28 DIAGNOSIS — Z951 Presence of aortocoronary bypass graft: Secondary | ICD-10-CM | POA: Diagnosis not present

## 2016-01-28 DIAGNOSIS — R002 Palpitations: Secondary | ICD-10-CM | POA: Diagnosis not present

## 2016-01-28 DIAGNOSIS — R42 Dizziness and giddiness: Secondary | ICD-10-CM | POA: Insufficient documentation

## 2016-01-28 DIAGNOSIS — Z8249 Family history of ischemic heart disease and other diseases of the circulatory system: Secondary | ICD-10-CM | POA: Insufficient documentation

## 2016-01-28 DIAGNOSIS — Z87891 Personal history of nicotine dependence: Secondary | ICD-10-CM | POA: Insufficient documentation

## 2016-01-28 LAB — MYOCARDIAL PERFUSION IMAGING
CHL CUP NUCLEAR SDS: 2
CHL CUP NUCLEAR SRS: 2
LVDIAVOL: 183 mL
LVSYSVOL: 108 mL
Peak HR: 64 {beats}/min
Rest HR: 47 {beats}/min
SSS: 4
TID: 1.28

## 2016-01-28 MED ORDER — TECHNETIUM TC 99M SESTAMIBI GENERIC - CARDIOLITE
9.9000 | Freq: Once | INTRAVENOUS | Status: AC | PRN
  Administered 2016-01-28: 9.9 via INTRAVENOUS

## 2016-01-28 MED ORDER — REGADENOSON 0.4 MG/5ML IV SOLN
0.4000 mg | Freq: Once | INTRAVENOUS | Status: AC
  Administered 2016-01-28: 0.4 mg via INTRAVENOUS

## 2016-01-28 MED ORDER — TECHNETIUM TC 99M SESTAMIBI GENERIC - CARDIOLITE
31.0000 | Freq: Once | INTRAVENOUS | Status: AC | PRN
  Administered 2016-01-28: 31 via INTRAVENOUS

## 2016-01-28 NOTE — Telephone Encounter (Signed)
Note faxed to [facility]

## 2016-01-29 ENCOUNTER — Telehealth: Payer: Self-pay | Admitting: Internal Medicine

## 2016-01-29 ENCOUNTER — Encounter: Payer: Self-pay | Admitting: *Deleted

## 2016-01-29 NOTE — Telephone Encounter (Signed)
New message  ° ° °Patient calling for test results.   °

## 2016-01-29 NOTE — Telephone Encounter (Signed)
[name] is calling about the stress test results for Kenneth [name]  And is wanting to know if they are available . Please call at [phone]   Thanks

## 2016-01-29 NOTE — Telephone Encounter (Signed)
PATIENT AND WIFE CAME BY OFFICE AND STATED THEY NEEDED THE TEST RESULTS TODAY - FOR [facility]  DR CRENSHAW REVIEWED STRESS TEST-CLEARED PATIENT LETTER FOR CLEARANCE SIGNED AND WILL BE GIVEN TO PATIENT BY KAY ([facility])

## 2016-01-29 NOTE — Telephone Encounter (Signed)
Informed patient stress test results are not available Patient states he was eager to find out - he states he maybe able to have hand surgery next Monday or Wednesday if possible  Aware will defer to Dr Debara Pickett

## 2016-02-10 ENCOUNTER — Encounter: Payer: Self-pay | Admitting: Internal Medicine

## 2016-02-10 ENCOUNTER — Ambulatory Visit (INDEPENDENT_AMBULATORY_CARE_PROVIDER_SITE_OTHER): Payer: Managed Care, Other (non HMO) | Admitting: Internal Medicine

## 2016-02-10 VITALS — BP 124/74 | HR 74 | Ht 69.0 in | Wt 228.5 lb

## 2016-02-10 DIAGNOSIS — Z951 Presence of aortocoronary bypass graft: Secondary | ICD-10-CM | POA: Diagnosis not present

## 2016-02-10 DIAGNOSIS — I209 Angina pectoris, unspecified: Secondary | ICD-10-CM

## 2016-02-10 DIAGNOSIS — I519 Heart disease, unspecified: Secondary | ICD-10-CM

## 2016-02-10 DIAGNOSIS — I10 Essential (primary) hypertension: Secondary | ICD-10-CM

## 2016-02-10 DIAGNOSIS — E785 Hyperlipidemia, unspecified: Secondary | ICD-10-CM | POA: Diagnosis not present

## 2016-02-10 NOTE — Progress Notes (Signed)
OFFICE NOTE  Chief Complaint:  Followup stress test  Primary Care Physician: Tula Nakayama  HPI:  Kenneth [name] is a 50 year old male who had been seen by [place] in consult previously. Dr. Debara Pickett saw him in August 2012. He had been having angina. He reportedly had normal epicardial coronaries at catheterization in [place]. He had an extensive workup there that included cardiac and GI evaluation. He was diagnosed with microvascular angina. He had been maintained on nitroglycerin, which helped. He actually even had EECP in the past which he said helped as well. He recently moved to the area and wanted to get established with a cardiologist. Dr. Debara Pickett recommended resuming his Ranexa. He also resumed his nitroglycerin patch and refilled his amlodipine. No functional studies have been done at our office. He was referred back to Dr. Ronnald Ramp for followup. He reports however several weeks ago he thinks he got out of sync with his medications. He ran out of his nitroglycerin patch and was taking more short acting nitroglycerin. He says reestablished his medicines, but reports that he continues to have some worsening chest pain and shortness of breath. He continues to exercise but is noticing more anginal symptoms earlier in his exercise.  Based on the change in his angina pattern, a stress test was recommended. This was performed on 03/12/2014 which showed a marked LCX territory defect with EF 47%.  This was a high risk study. Cardiac catheterization was recommended and performed the next day by Dr. Claiborne Billings.  This revealed the following:  Low normal to mild LV dysfunction with an ejection fraction of 50% with subtle mild anterolateral hypocontractility.  Significant multivessel coronary obstructive disease with 60-70% LAD stenosis after the first septal perforating artery and 50% mid LAD stenosis, probable chronic occlusion of the left circumflex vessel with collateralization to the distal marginal branch  and 95% stenosis in the first marginal branch with 99% stenosis in its distal segment; and probable previous subtotal stenosis of the proximal RCA with bridging collaterals and evidence for a total distal RCA stenosis with evidence for left to right collaterals.  Based on these findings, CABG was recommended.  He underwent 4 vessel CABG on 03/18/14, as follows:  1. Median Sternotomy 2. Extracorporeal circulation       3.   Coronary artery bypass grafting x 4  Left internal mammary graft to the LAD  SVG to OM  Sequential SVG to PDA and PL       4. Endoscopic vein harvest from the right leg  Kenneth [name] is feeling quite well. His energy is improved and his color is better. He reports some mild soreness to his sternal wound but otherwise is recovering nicely. He is scheduled to see Dr. Cyndia Bent on April 22. He has been involved in cardiac rehabilitation has had no issues. He denies any further angina. We did a recheck of his lipid profile today which shows an improvement in his LDL and particle numbers however he is still not at goal. He is on high dose Lipitor.  I saw Kenneth [name] back in the office today. Overall he is doing extremely well. He is back to normal activities. He's not required any nitrates. Cholesterol still been elevated he is due for recheck to see if the addition is that he had to atorvastatin has gotten into his goal cholesterol. He is also on gemfibrozil. Blood pressure is very well controlled today. A couple weeks ago he had a dizzy spell but he felt that this was  related to taking Vybrid.  Kenneth [name] returns today for follow-up. It's come to my attention that he fractured his left hand apparently while hitting a door. After further questioning he's been under significant stress and may be dealing with some depression at home. His wife is also concerned about some substance abuse and perhaps abusing his Ambien. We have been prescribing that for him, but there were times where she  saw him taking 10 or 12 Ambien at a time. He apparently is been having some hallucinations and sleep talking as well as sleepwalking. He has been on Wellbutrin but does not feel that it's helping him much. He reports he and his wife have been getting some counseling. He is apparently scheduled to have surgery on his left arm but is been reporting some chest pain. He says it comes on somewhat with exertion and is relieved by rest. His EKG today shows sinus rhythm with inferior T-wave changes concerning for ischemia. These are slightly different than his prior EKG. As mentioned he had bypass surgery almost 2 years ago.  I saw Kenneth [name] back today in the office. He underwent a nuclear stress test which was negative for ischemia however EF is reduced to 48%. Visually it appeared normal and is suspect for a possible gating problem. He underwent successful hand surgery and has noted some improvement in function. He is not having any more chest pain or shortness of breath. He does feel some fatigue during the day. I spoke with him and his wife about the findings on the stress test and feel that a limited echo to rule out any cardiomyopathy is warranted.  PMHx:  Past Medical History  Diagnosis Date  . Syndrome X, cardiac ([place])   . Hypertension   . Hyperlipidemia   . History of Doppler ultrasound 07/09/2013    carotid doppler; moderate narrowing of both subclavian arteries, with normal carotid arteries    Past Surgical History  Procedure Laterality Date  . Kidney stone surgery  age 37  . Shoulder arthroscopy  2001    L shoulder  . Cardiac catheterization  2005  . Coronary artery bypass graft N/A 03/18/2014    Procedure: CORONARY ARTERY BYPASS GRAFTING (CABG) x 4 using endoscopically harvested right saphenous vein and left internal mammary artery;  Surgeon: Gaye Pollack, MD;  Location: [facility] OR;  Service: Open Heart Surgery;  Laterality: N/A;  . Intraoperative transesophageal echocardiogram N/A 03/18/2014     Procedure: INTRAOPERATIVE TRANSESOPHAGEAL ECHOCARDIOGRAM;  Surgeon: Gaye Pollack, MD;  Location: [facility] OR;  Service: Open Heart Surgery;  Laterality: N/A;  . Left heart catheterization with coronary angiogram N/A 03/14/2014    Procedure: LEFT HEART CATHETERIZATION WITH CORONARY ANGIOGRAM;  Surgeon: Troy Sine, MD;  Location: [facility] CATH LAB;  Service: Cardiovascular;  Laterality: N/A;    FAMHx:  Family History  Problem Relation Age of Onset  . Heart attack Father   . Cancer Father     lung  . Heart attack Mother   . Stroke Mother   . Diabetes Mother     SOCHx:   reports that he quit smoking about 3 years ago. His smoking use included Cigars. He has never used smokeless tobacco. He reports that he does not drink alcohol or use illicit drugs.  ALLERGIES:  Allergies  Allergen Reactions  . Tetracyclines & Related Nausea And Vomiting    ROS: A comprehensive review of systems was negative except for: Musculoskeletal: positive for Left hand pain  HOME MEDS: Current  Outpatient Prescriptions  Medication Sig Dispense Refill  . aspirin EC 325 MG tablet Take 1 tablet (325 mg total) by mouth daily. 30 tablet 6  . atorvastatin (LIPITOR) 80 MG tablet Take 1 tablet (80 mg total) by mouth at bedtime. 90 tablet 3  . gemfibrozil (LOPID) 600 MG tablet Take 1 tablet (600 mg total) by mouth 2 (two) times daily. 60 tablet 11  . lisinopril (PRINIVIL,ZESTRIL) 10 MG tablet TAKE 1 TABLET BY MOUTH EVERY DAY 30 tablet 10  . metoprolol tartrate (LOPRESSOR) 25 MG tablet TAKE 1 TABLET BY MOUTH TWICE A DAY 60 tablet 11  . oxyCODONE-acetaminophen (PERCOCET/ROXICET) 5-325 MG tablet Take 1-2 tablets by mouth every 4 (four) hours as needed.  0  . TESTOSTERONE IM Inject 1.5 mLs into the muscle every 7 (seven) days.     Marland Kitchen ZETIA 10 MG tablet TAKE 1 TABLET EVERY DAY 30 tablet 10   No current facility-administered medications for this visit.    LABS/IMAGING: No results found for this or any previous visit (from  the past 48 hour(s)). No results found.  VITALS: BP 124/74 mmHg  Pulse 74  Ht 5\' 9"  (1.753 m)  Wt 228 lb 8 oz (103.647 kg)  BMI 33.73 kg/m2  EXAM: deferred  EKG: Deferred  ASSESSMENT: 1. Chest pain - low risk stress test, but LVEF 48% 2. CAD s/p 4 vessel CABG (LIMA to LAD, SVG to OM, and sequential SVG to PDA/PLB) - 02/2014 3. Hypertension- at goal 4. Hyperlipidemia - not at goal 5. Obesity - improving 6. Depression 7. Possible substance abuse-including Ambien 8. Indeterminate risk for surgery  PLAN: 1.   Mr. Cubbison had a low risk nuclear stress test however EF was reduced at 48%. I suspect this is a gating abnormality. I'm recommending a limited echocardiogram to evaluate for any change in LV function. Otherwise would plan continue his current medications we'll see him back in 6 months.  Kenneth Casino, MD, [facility] Attending Cardiologist Garfield C Sherrika Weakland 02/10/2016, 5:13 PM

## 2016-02-10 NOTE — Patient Instructions (Signed)
Your physician has requested that you have a limited echocardiogram @ [address] - [address]. Echocardiography is a painless test that uses sound waves to create images of your heart. It provides your doctor with information about the size and shape of your heart and how well your heart's chambers and valves are working. This procedure takes approximately one hour. There are no restrictions for this procedure.    Your physician wants you to follow-up in: 6 months with Dr. Hilty. You will receive a reminder letter in the mail two  months in advance. If you don't receive a letter, please call our office to schedule the follow-up appointment.  

## 2016-02-24 ENCOUNTER — Other Ambulatory Visit: Payer: Self-pay

## 2016-02-24 ENCOUNTER — Ambulatory Visit (HOSPITAL_COMMUNITY): Payer: Managed Care, Other (non HMO) | Attending: Cardiology

## 2016-02-24 DIAGNOSIS — I517 Cardiomegaly: Secondary | ICD-10-CM | POA: Diagnosis not present

## 2016-02-24 DIAGNOSIS — I519 Heart disease, unspecified: Secondary | ICD-10-CM | POA: Insufficient documentation

## 2016-02-26 ENCOUNTER — Telehealth: Payer: Self-pay | Admitting: Internal Medicine

## 2016-02-26 NOTE — Telephone Encounter (Signed)
New message ° ° ° ° ° °Returning a call to the nurse to get echo results °

## 2016-02-26 NOTE — Telephone Encounter (Signed)
Results reviewed with patient

## 2016-07-01 ENCOUNTER — Other Ambulatory Visit: Payer: Self-pay | Admitting: Internal Medicine

## 2016-07-01 NOTE — Telephone Encounter (Signed)
Rx(s) sent to pharmacy electronically.  

## 2016-08-15 ENCOUNTER — Other Ambulatory Visit: Payer: Self-pay | Admitting: Internal Medicine

## 2016-08-16 NOTE — Telephone Encounter (Signed)
Rx(s) sent to pharmacy electronically.  

## 2016-08-24 ENCOUNTER — Other Ambulatory Visit: Payer: Self-pay | Admitting: Internal Medicine

## 2016-08-29 ENCOUNTER — Other Ambulatory Visit: Payer: Self-pay | Admitting: Internal Medicine

## 2016-08-29 ENCOUNTER — Other Ambulatory Visit: Payer: Self-pay | Admitting: Cardiovascular Disease

## 2016-08-30 NOTE — Telephone Encounter (Signed)
Rx(s) sent to pharmacy electronically.  

## 2016-09-21 ENCOUNTER — Other Ambulatory Visit: Payer: Self-pay | Admitting: Internal Medicine

## 2016-09-22 ENCOUNTER — Ambulatory Visit: Payer: Self-pay | Admitting: Orthopedic Surgery

## 2016-09-27 ENCOUNTER — Ambulatory Visit: Payer: Managed Care, Other (non HMO) | Admitting: Nurse Practitioner

## 2016-10-03 ENCOUNTER — Other Ambulatory Visit: Payer: Self-pay | Admitting: Internal Medicine

## 2016-10-04 ENCOUNTER — Other Ambulatory Visit: Payer: Self-pay | Admitting: Internal Medicine

## 2016-10-06 ENCOUNTER — Ambulatory Visit: Payer: Self-pay | Admitting: Orthopedic Surgery

## 2016-10-06 ENCOUNTER — Encounter: Payer: Self-pay | Admitting: Nurse Practitioner

## 2016-10-06 ENCOUNTER — Ambulatory Visit (INDEPENDENT_AMBULATORY_CARE_PROVIDER_SITE_OTHER): Payer: Managed Care, Other (non HMO) | Admitting: Nurse Practitioner

## 2016-10-06 VITALS — BP 131/77 | HR 61 | Ht 69.0 in | Wt 230.0 lb

## 2016-10-06 DIAGNOSIS — Z01810 Encounter for preprocedural cardiovascular examination: Secondary | ICD-10-CM

## 2016-10-06 DIAGNOSIS — R0989 Other specified symptoms and signs involving the circulatory and respiratory systems: Secondary | ICD-10-CM | POA: Diagnosis not present

## 2016-10-06 DIAGNOSIS — I119 Hypertensive heart disease without heart failure: Secondary | ICD-10-CM | POA: Insufficient documentation

## 2016-10-06 DIAGNOSIS — I2510 Atherosclerotic heart disease of native coronary artery without angina pectoris: Secondary | ICD-10-CM | POA: Diagnosis not present

## 2016-10-06 DIAGNOSIS — E785 Hyperlipidemia, unspecified: Secondary | ICD-10-CM | POA: Insufficient documentation

## 2016-10-06 NOTE — Progress Notes (Signed)
Office Visit    Patient Name: Kenneth [name] Date of Encounter: 10/06/2016  Primary Care Provider:  Tula Nakayama Primary Cardiologist:  C. Hilty, MD   Chief Complaint    50 y/o ? with a h/o CAD s/p CABG x 4 in 02/2014, HTN, and HL, who presents for preop clearance pending L hip replacement.  Past Medical History    Past Medical History:  Diagnosis Date  . CAD (coronary artery disease)    a. 02/2014 Abnl MV with defect in LCX territory;  b. 02/2014 Cath: LAD 60-70p, 63m, LCX 100 CTO, OM1 95p, 99d, RCA 100 CTO w/ L->R collaterals; c. 02/2014 CABG x 4 (LIMA->LAD, VG->OM, VG->RPDA->RPL); d. 01/2016 MV: 41%, med size, sev intensity fixed basal inferior lateral defect w/o ischemia.  . Carotid arterial disease ([place])    a. 06/2013 Carotid U/S: moderate narrowing of both subclavian arteries, with normal carotid arteries;  b. 02/2014 Carotid U/S: 1-39% bilat ICA stenosis.  Marland Kitchen History of echocardiogram    a. 02/2016 Echo: EF 50-55%, no rwma, mildly dil LA, nl RV fxn, mildly dil RA.  Marland Kitchen Hyperlipidemia   . Hypertensive heart disease   . Syndrome X, cardiac ([place])    a. Previously felt to have microvascular angina following remote cath in [place]-->treated with nitrates, ranexa, EECP.  Later found to have multivessel CAD.   Past Surgical History:  Procedure Laterality Date  . CARDIAC CATHETERIZATION  2005  . CORONARY ARTERY BYPASS GRAFT N/A 03/18/2014   Procedure: CORONARY ARTERY BYPASS GRAFTING (CABG) x 4 using endoscopically harvested right saphenous vein and left internal mammary artery;  Surgeon: Gaye Pollack, MD;  Location: [facility] OR;  Service: Open Heart Surgery;  Laterality: N/A;  . INTRAOPERATIVE TRANSESOPHAGEAL ECHOCARDIOGRAM N/A 03/18/2014   Procedure: INTRAOPERATIVE TRANSESOPHAGEAL ECHOCARDIOGRAM;  Surgeon: Gaye Pollack, MD;  Location: [facility] OR;  Service: Open Heart Surgery;  Laterality: N/A;  . KIDNEY STONE SURGERY  age 49  . LEFT HEART CATHETERIZATION WITH CORONARY ANGIOGRAM N/A  03/14/2014   Procedure: LEFT HEART CATHETERIZATION WITH CORONARY ANGIOGRAM;  Surgeon: Troy Sine, MD;  Location: [facility] CATH LAB;  Service: Cardiovascular;  Laterality: N/A;  . SHOULDER ARTHROSCOPY  2001   L shoulder    Allergies  Allergies  Allergen Reactions  . Tetracyclines & Related Nausea And Vomiting    History of Present Illness    50 y/o ? with a h/o CAD and microvascular angina, initially dx following remote catheterization in [place]. He was medically managed with nitrates and ranexa and subsequently developed recurrent angina in early 2015, prompting stress testing, which was notable for LCX territory defect.  This lead to cath revealing severe multivessel CAD including CTOs of the LCX and RCA.  He subsequently underwent CABG x 4 in 02/2014 (details in [place]).  He also has a h/o HTN, HL, mild carotid dzs, and OA of the left hip.  In 01/2016, he underwent stress testing due to new ecg changes, and this was low risk w/o ischemia, though EF was read as 48%.  F/u echo showed low nl EF @ 50-55%.  Since his last visit in 01/2016, he has done quite well from a cardiac standpoint.  He has not had any chest pain or dyspnea.  He exercises @ the gym 3-4 days/wk, lifting weights and also using a heavy bag for aerobic activity.  Though he notes reasonable exercise tolerance, he has had increasing left hip pain.  He has been evaluated by orthopaedics with a recommendation for left total  hip arthroplasty.  This is tentatively scheduled for 10/26/2016.  He denies palpitations, pnd, orthopnea, n, v, dizziness, syncope, edema, weight gain, or early satiety.   Home Medications    Prior to Admission medications   Medication Sig Start Date End Date Taking? Authorizing Provider  aspirin EC 325 MG tablet TAKE 1 TABLET BY MOUTH EVERY DAY 08/16/16  Yes Pixie Casino, MD  atorvastatin (LIPITOR) 80 MG tablet TAKE 1 TABLET (80 MG TOTAL) BY MOUTH AT BEDTIME. 10/03/16  Yes Pixie Casino, MD  ezetimibe (ZETIA) 10 MG  tablet TAKE 1 TABLET EVERY DAY 08/30/16  Yes Pixie Casino, MD  gemfibrozil (LOPID) 600 MG tablet TAKE 1 TABLET (600 MG TOTAL) BY MOUTH 2 (TWO) TIMES DAILY. 08/24/16  Yes Pixie Casino, MD  lisinopril (PRINIVIL,ZESTRIL) 10 MG tablet TAKE 1 TABLET (10 MG TOTAL) BY MOUTH DAILY. PLEASE CONTACT OFFICE FOR ADDITIONAL REFILLS 2ND ATTEMPT 10/04/16  Yes Pixie Casino, MD  meloxicam (MOBIC) 15 MG tablet Take 15 mg by mouth daily. 09/13/16  Yes Historical Provider, MD  metoprolol tartrate (LOPRESSOR) 25 MG tablet TAKE 1 TABLET BY MOUTH TWICE A DAY 08/30/16  Yes Troy Sine, MD  TESTOSTERONE IM Inject 1.5 mLs into the muscle every 7 (seven) days.    Yes Historical Provider, MD  traMADol (ULTRAM) 50 MG tablet Take 1 tablet by mouth every 8 (eight) hours as needed. 09/15/16  Yes Historical Provider, MD  VYVANSE 40 MG capsule Take 40 mg by mouth every morning. 09/26/16  Yes Historical Provider, MD  zolpidem (AMBIEN) 10 MG tablet Take 10 mg by mouth at bedtime as needed. for sleep 09/22/16  Yes Historical Provider, MD    Review of Systems    He has been having left hip pain but denies chest pain, palpitations, dyspnea, pnd, orthopnea, n, v, dizziness, syncope, edema, weight gain, or early satiety.  All other systems reviewed and are otherwise negative except as noted above.  Physical Exam    VS:  BP 131/77   Pulse 61   Ht 5\' 9"  (1.753 m)   Wt 230 lb (104.3 kg)   BMI 33.97 kg/m  , BMI Body mass index is 33.97 kg/m. GEN: Well nourished, well developed, in no acute distress.  HEENT: normal.  Neck: Supple, no JVD, soft bilat carotid bruits, no masses. Cardiac: RRR, no murmurs, rubs, or gallops. No clubbing, cyanosis, edema.  Radials/DP/PT 2+ and equal bilaterally.  Respiratory:  Respirations regular and unlabored, clear to auscultation bilaterally. GI: Soft, nontender, nondistended, BS + x 4. MS: no deformity or atrophy. Skin: warm and dry, no rash. Neuro:  Strength and sensation are intact. Psych:  Normal affect.  Accessory Clinical Findings    ECG - RSR, 61, LAD (previously RAD), TWI in III (old).  Assessment & Plan    1.  CAD/Preoperative cardiac evaluation:  Pt presents today for preop eval r/t ongoing left hip pain and need for total hip arthroplasty tentatively scheduled for 10/26/2016.  He is s/p CABG x 4 in 2015 with low risk myoview in 01/2016.  Echo that followed showed nl EF.  He has done quite well over the past 8 months, exercising 3-4 x /wk both with weights and some aerobic work on a heavy bag.  He has not been having chest pain or dyspnea.  In that setting, he may proceed with hip surgery without the need for further ischemic evaluation at this time.  He may come off of ASA as needed prior to surgery  with a plan to resume it once it is feasible in the post-operative period.  He should remain on  blocker and high potency statin therapy throughout the perioperative period.  We will be available in-house if needed.  2.  Hypertensive Heart Disease:  BP is stable on  blocker and acei.  3.  HL/HTG:  LDL was 217 in 07/2015 according to our records.  He is on high potency lipitor, lopid, and zetia.  LFTs were nl @ that time.  We will need to f/u FLP and lft's at future f/u appt.  4.  Bilateral Carotid Bruits:  Soft bilat bruits.  He had 1-39% bilat ICA stenosis in 2015.  F/u carotid u/s.  Cont asa and statin therapy.  5.  OA:  Pending L hip THA.  See #1.  6.  Disposition:  Ok for hip surgery without further ischemic eval.  Cont  blocker and statin throughout the perioperative period.  F/U with Dr. Debara Pickett in 6 mos or sooner as necessary.   Murray Hodgkins, NP 10/06/2016, 3:48 PM

## 2016-10-06 NOTE — Patient Instructions (Addendum)
Medication Instructions:  Your physician recommends that you continue on your current medications as directed. Please refer to the Current Medication list given to you today.  Labwork: none  Testing/Procedures: Your physician has requested that you have a carotid duplex. This test is an ultrasound of the carotid arteries in your neck. It looks at blood flow through these arteries that supply the brain with blood. Allow one hour for this exam. There are no restrictions or special instructions.  Follow-Up: Your physician wants you to follow-up in: 6 months with Dr Debara Pickett. You will receive a reminder letter in the mail two months in advance. If you don't receive a letter, please call our office to schedule the follow-up appointment.  Any Other Special Instructions Will Be Listed Below (If Applicable).  Schedule Carotid Ultrasound  We will fax over medical clearance.   If you need a refill on your cardiac medications before your next appointment, please call your pharmacy.

## 2016-10-14 NOTE — Patient Instructions (Addendum)
Kenneth [name]  10/14/2016   Your procedure is scheduled on: 10/26/16  Report to [facility] Main  Entrance take Kenneth [name]  elevators to 3rd floor to  Kenneth [name] at 215 PM Call this number if you have problems the morning of surgery [phone]   Remember: ONLY 1 PERSON MAY GO WITH YOU TO SHORT STAY TO GET  READY MORNING OF YOUR SURGERY.  Do not eat food  :After Midnight---MAY HAVE CLEAR LIQUIDS MORNING OF SURGERY UNTIL 10:20AM--Then NOTHING BY MOUTH.     Take these medicines the morning of surgery with A SIP OF WATER: Metoprolol, Gemfibrozil May take Tramadol if needed DO NOT TAKE ANY DIABETIC MEDICATIONS DAY OF YOUR SURGERY                               You may not have any metal on your body including hair pins and              piercings  Do not wear jewelry, make-up, lotions, powders or perfumes, deodorant             Do not wear nail polish.  Do not shave  48 hours prior to surgery.              Men may shave face and neck.   Do not bring valuables to the hospital. Kenneth [name].  Contacts, dentures or bridgework may not be worn into surgery.  Leave suitcase in the car. After surgery it may be brought to your room.          Kenneth [name] - Preparing for Surgery Before surgery, you can play an important role.  Because skin is not sterile, your skin needs to be as free of germs as possible.  You can reduce the number of germs on your skin by washing with CHG (chlorahexidine gluconate) soap before surgery.  CHG is an antiseptic cleaner which kills germs and bonds with the skin to continue killing germs even after washing. Please DO NOT use if you have an allergy to CHG or antibacterial soaps.  If your skin becomes reddened/irritated stop using the CHG and inform your nurse when you arrive at Short Stay. Do not shave (including legs and underarms) for at least 48 hours prior to the first CHG shower.  You may shave  your face/neck. Please follow these instructions carefully:  1.  Shower with CHG Soap the night before surgery and the  morning of Surgery.  2.  If you choose to wash your hair, wash your hair first as usual with your  normal  shampoo.  3.  After you shampoo, rinse your hair and body thoroughly to remove the  shampoo.                           4.  Use CHG as you would any other liquid soap.  You can apply chg directly  to the skin and wash                       Gently with a scrungie or clean washcloth.  5.  Apply the CHG Soap to your body ONLY FROM THE NECK DOWN.  Do not use on face/ open                           Wound or open sores. Avoid contact with eyes, ears mouth and genitals (private parts).                       Wash face,  Genitals (private parts) with your normal soap.             6.  Wash thoroughly, paying special attention to the area where your surgery  will be performed.  7.  Thoroughly rinse your body with warm water from the neck down.  8.  DO NOT shower/wash with your normal soap after using and rinsing off  the CHG Soap.                9.  Pat yourself dry with a clean towel.            10.  Wear clean pajamas.            11.  Place clean sheets on your bed the night of your first shower and do not  sleep with pets. Day of Surgery : Do not apply any lotions/deodorants the morning of surgery.  Please wear clean clothes to the hospital/surgery center.  FAILURE TO FOLLOW THESE INSTRUCTIONS MAY RESULT IN THE CANCELLATION OF YOUR SURGERY PATIENT SIGNATURE_________________________________  NURSE SIGNATURE__________________________________  ________________________________________________________________________   Kenneth [name]  An incentive spirometer is a tool that can help keep your lungs clear and active. This tool measures how well you are filling your lungs with each breath. Taking long deep breaths may help reverse or decrease the chance of developing  breathing (pulmonary) problems (especially infection) following:  A long period of time when you are unable to move or be active. BEFORE THE PROCEDURE   If the spirometer includes an indicator to show your best effort, your nurse or respiratory therapist will set it to a desired goal.  If possible, sit up straight or lean slightly forward. Try not to slouch.  Hold the incentive spirometer in an upright position. INSTRUCTIONS FOR USE  1. Sit on the edge of your bed if possible, or sit up as far as you can in bed or on a chair. 2. Hold the incentive spirometer in an upright position. 3. Breathe out normally. 4. Place the mouthpiece in your mouth and seal your lips tightly around it. 5. Breathe in slowly and as deeply as possible, raising the piston or the ball toward the top of the column. 6. Hold your breath for 3-5 seconds or for as long as possible. Allow the piston or ball to fall to the bottom of the column. 7. Remove the mouthpiece from your mouth and breathe out normally. 8. Rest for a few seconds and repeat Steps 1 through 7 at least 10 times every 1-2 hours when you are awake. Take your time and take a few normal breaths between deep breaths. 9. The spirometer may include an indicator to show your best effort. Use the indicator as a goal to work toward during each repetition. 10. After each set of 10 deep breaths, practice coughing to be sure your lungs are clear. If you have an incision (the cut made at the time of surgery), support your incision when coughing by placing a pillow or rolled up towels firmly against it. Once you are able to get out of  bed, walk around indoors and cough well. You may stop using the incentive spirometer when instructed by your caregiver.  RISKS AND COMPLICATIONS  Take your time so you do not get dizzy or light-headed.  If you are in pain, you may need to take or ask for pain medication before doing incentive spirometry. It is harder to take a deep  breath if you are having pain. AFTER USE  Rest and breathe slowly and easily.  It can be helpful to keep track of a log of your progress. Your caregiver can provide you with a simple table to help with this. If you are using the spirometer at home, follow these instructions: [place] IF:   You are having difficultly using the spirometer.  You have trouble using the spirometer as often as instructed.  Your pain medication is not giving enough relief while using the spirometer.  You develop fever of 100.5 F (38.1 C) or higher. SEEK IMMEDIATE MEDICAL CARE IF:   You cough up bloody sputum that had not been present before.  You develop fever of 102 F (38.9 C) or greater.  You develop worsening pain at or near the incision site. MAKE SURE YOU:   Understand these instructions.  Will watch your condition.  Will get help right away if you are not doing well or get worse. Document Released: 04/24/2007 Document Revised: 03/05/2012 Document Reviewed: 06/25/2007 ExitCare Patient Information 2014 ExitCare, [place].   ________________________________________________________________________  WHAT IS A BLOOD TRANSFUSION? Blood Transfusion Information  A transfusion is the replacement of blood or some of its parts. Blood is made up of multiple cells which provide different functions.  Red blood cells carry oxygen and are used for blood loss replacement.  White blood cells fight against infection.  Platelets control bleeding.  Plasma helps clot blood.  Other blood products are available for specialized needs, such as hemophilia or other clotting disorders. BEFORE THE TRANSFUSION  Who gives blood for transfusions?   Healthy volunteers who are fully evaluated to make sure their blood is safe. This is blood bank blood. Transfusion therapy is the safest it has ever been in the practice of medicine. Before blood is taken from a donor, a complete history is taken to make sure  that person has no history of diseases nor engages in risky social behavior (examples are intravenous drug use or sexual activity with multiple partners). The donor's travel history is screened to minimize risk of transmitting infections, such as malaria. The donated blood is tested for signs of infectious diseases, such as HIV and hepatitis. The blood is then tested to be sure it is compatible with you in order to minimize the chance of a transfusion reaction. If you or a relative donates blood, this is often done in anticipation of surgery and is not appropriate for emergency situations. It takes many days to process the donated blood. RISKS AND COMPLICATIONS Although transfusion therapy is very safe and saves many lives, the main dangers of transfusion include:   Getting an infectious disease.  Developing a transfusion reaction. This is an allergic reaction to something in the blood you were given. Every precaution is taken to prevent this. The decision to have a blood transfusion has been considered carefully by your caregiver before blood is given. Blood is not given unless the benefits outweigh the risks. AFTER THE TRANSFUSION  Right after receiving a blood transfusion, you will usually feel much better and more energetic. This is especially true if your red blood  cells have gotten low (anemic). The transfusion raises the level of the red blood cells which carry oxygen, and this usually causes an energy increase.  The nurse administering the transfusion will monitor you carefully for complications. HOME CARE INSTRUCTIONS  No special instructions are needed after a transfusion. You may find your energy is better. Speak with your caregiver about any limitations on activity for underlying diseases you may have. SEEK MEDICAL CARE IF:   Your condition is not improving after your transfusion.  You develop redness or irritation at the intravenous (IV) site. SEEK IMMEDIATE MEDICAL CARE IF:  Any of  the following symptoms occur over the next 12 hours:  Shaking chills.  You have a temperature by mouth above 102 F (38.9 C), not controlled by medicine.  Chest, back, or muscle pain.  People around you feel you are not acting correctly or are confused.  Shortness of breath or difficulty breathing.  Dizziness and fainting.  You get a rash or develop hives.  You have a decrease in urine output.  Your urine turns a dark color or changes to pink, red, or brown. Any of the following symptoms occur over the next 10 days:  You have a temperature by mouth above 102 F (38.9 C), not controlled by medicine.  Shortness of breath.  Weakness after normal activity.  The white part of the eye turns yellow (jaundice).  You have a decrease in the amount of urine or are urinating less often.  Your urine turns a dark color or changes to pink, red, or brown. Document Released: 12/09/2000 Document Revised: 03/05/2012 Document Reviewed: 07/28/2008 ExitCare Patient Information 2014 [place].  _______________________________________________________________________   CLEAR LIQUID DIET   Foods Allowed                                                                     Foods Excluded  Coffee and tea, regular and decaf                             liquids that you cannot  Plain Jell-O in any flavor                                             see through such as: Fruit ices (not with fruit pulp)                                     milk, soups, orange juice  Iced Popsicles                                    All solid food Carbonated beverages, regular and diet                                    Cranberry, grape and apple juices Sports drinks like Gatorade Lightly seasoned clear broth or consume(fat free)  Sugar, honey syrup  Sample Menu Breakfast                                Lunch                                     Supper Cranberry juice                    Beef broth                             Chicken broth Jell-O                                     Grape juice                           Apple juice Coffee or tea                        Jell-O                                      Popsicle                                                Coffee or tea                        Coffee or tea  _____________________________________________________________________

## 2016-10-16 ENCOUNTER — Ambulatory Visit: Payer: Self-pay | Admitting: Orthopedic Surgery

## 2016-10-16 NOTE — H&P (Signed)
Kenneth [name] DOB: 09-06-1966 Married / Language: English / Race: White Male Date of Admission;  10/26/2016 CC:  Left Hip Pain History of Present Illness The patient is a 50 year old male who comes in for a preoperative History and Physical. The patient is scheduled for a left total hip arthroplasty (anterior) to be performed by Dr. Dione Plover. [name], MD at [facility] on 10-26-2016. The patient is a 50 year old male who presented with a hip problem. The patient reports left hip problems including pain symptoms that have been present for 5 year(s). Symptoms reported include hip pain, night pain (occasionally), stiffness, difficulty flexing hip, difficulty rotating hip and difficulty ambulating The patient does not report any radiation of symptoms. The patient describes the hip problem as aching. Onset of symptoms was gradual. The patient feels as if their symptoms are does feel they are worsening. Symptoms are exacerbated by weight bearing and walking. Current treatment includes non-opioid analgesics (Tylenol). He has been involved with martial arts for several years but has had to stop that because of the left hip. He has deep groin pain but with no radiation. He reprots that any activity that requires internal or external rotation of the hip is excruitiating. No previous injections or surgeries. He states that the hip has gotten progressively worse over time. Pains in the groin and buttock radiating down his thigh to his knee. He has lost more movement over time. He attributes this to his long history of martial arts. He is not having any right hip pain at this time. The left hip is definitely limiting what he can and cannot do. He would like to go ahead and proceed with surgery at this time. They have been treated conservatively in the past for the above stated problem and despite conservative measures, they continue to have progressive pain and severe functional limitations and dysfunction.  They have failed non-operative management including home exercise, medications. It is felt that they would benefit from undergoing total joint replacement. Risks and benefits of the procedure have been discussed with the patient and they elect to proceed with surgery. There are no active contraindications to surgery such as ongoing infection or rapidly progressive neurological disease.  Problem List/Past Medical  Closed displaced fracture of neck of third metacarpal bone of left hand with routine healing [id number])  Primary osteoarthritis of left hip (M16.12)  Pain of left hip joint (M25.552)  Anxiety Disorder  Coronary artery disease  Depression  High blood pressure  Hypercholesterolemia  Kidney Stone  exema Finger stiffness, right (M25.641)  02/03/16 ORIF metacarpal right 3rd digit and metacarpal fracture without ORIF to 4th  Allergies Tetracycline HCl *Tetracyclines**  Sickness  Family History Cancer  Father, Mother. Cerebrovascular Accident  Mother. Chronic Obstructive Lung Disease  Mother. Congestive Heart Failure  Mother. Depression  Brother, Father, Sister. Diabetes Mellitus  Mother. Drug / Alcohol Addiction  child First Degree Relatives  reported Heart Disease  Brother, Father, Mother, Sister. Heart disease in male family member before age 63  Heart disease in male family member before age 74  Hypertension  Brother, Mother, Sister. Osteoarthritis  Mother.  Social History  Children  5 or more Current work status  unemployed Drug/Alcohol Rehab (Currently)  Exercise  Exercises never Former drinker  01/26/2016: In the past drank beer, wine and hard liquor only occasionally per week History of drug/alcohol rehab  Living situation  live with spouse Marital status  married Not under pain contract  Number of flights  of stairs before winded  2-3 Previously addicted to/Dependent on drugs or pain medications  Tobacco / smoke exposure   01/26/2016: yes Tobacco use  Never smoker. 01/26/2016: uses less than 1/2 can(s) smokeless per week  Medication History TraMADol HCl (50MG  Tablet, 1-2 Tablet Oral every 6-8 hours as needed for pain, Taken starting 10/10/2016) Active. (called to [facility] ([id number]) Atorvastatin Calcium (80MG  Tablet, Oral) Active. Vyvanse (40MG  Capsule, Oral) Active. Zetia (10MG  Tablet, Oral) Active. Lisinopril (10MG  Tablet, Oral) Active. Meloxicam (15MG  Tablet, 1 (one) Tablet Oral daily, Taken starting 07/12/2016) Active. Metoprolol Tartrate (25MG  Tablet, Oral) Active. Aspirin EC (325MG  Tablet DR, Oral) Active. Gemfibrozil (600MG  Tablet, Oral) Active. Testosterone Cypionate (200MG /ML Solution, Intramuscular) Active.  Past Surgical History CABG  Date: 2015. 4 Vessels Left Shoulder  Date: 2001. Right Kidney  Date: 50. Left Hand  Date: 2017.   Review of Systems General Present- Fatigue. Not Present- Chills, Fever, Memory Loss, Night Sweats, Weight Gain and Weight Loss. Skin Not Present- Eczema, Hives, Itching, Lesions and Rash. HEENT Not Present- Dentures, Double Vision, Headache, Hearing Loss, Tinnitus and Visual Loss. Respiratory Not Present- Allergies, Chronic Cough, Coughing up blood, Shortness of breath at rest and Shortness of breath with exertion. Cardiovascular Not Present- Chest Pain, Difficulty Breathing Lying Down, Murmur, Palpitations, Racing/skipping heartbeats and Swelling. Gastrointestinal Not Present- Abdominal Pain, Bloody Stool, Constipation, Diarrhea, Difficulty Swallowing, Heartburn, Jaundice, Loss of appetitie, Nausea and Vomiting. Male Genitourinary Not Present- Blood in Urine, Discharge, Flank Pain, Incontinence, Painful Urination, Urgency, Urinary frequency, Urinary Retention, Urinating at Night and Weak urinary stream. Musculoskeletal Present- Back Pain and Joint Pain. Not Present- Joint Swelling, Morning Stiffness, Muscle Pain, Muscle Weakness and  Spasms. Neurological Not Present- Blackout spells, Difficulty with balance, Dizziness, Paralysis, Tremor and Weakness. Psychiatric Not Present- Insomnia.  Vitals Weight: 225 lb Height: 69in Weight was reported by patient. Height was reported by patient. Body Surface Area: 2.17 m Body Mass Index: 33.23 kg/m  Pulse: 56 (Regular)  BP: 132/82 (Sitting, Right Arm, Standard   Physical Exam General Mental Status -Alert, cooperative and good historian. General Appearance-pleasant, Not in acute distress. Orientation-Oriented X3. Build & Nutrition-Well nourished and Well developed.  Head and Neck Head-normocephalic, atraumatic . Neck Global Assessment - supple, no bruit auscultated on the right, no bruit auscultated on the left.  Eye Pupil - Bilateral-Regular and Round. Motion - Bilateral-EOMI.  Chest and Lung Exam Auscultation Breath sounds - clear at anterior chest wall and clear at posterior chest wall. Adventitious sounds - No Adventitious sounds.  Cardiovascular Auscultation Rhythm - Regular rate and rhythm. Heart Sounds - S1 WNL and S2 WNL. Murmurs & Other Heart Sounds - Auscultation of the heart reveals - No Murmurs.  Abdomen Palpation/Percussion Tenderness - Abdomen is non-tender to palpation. Rigidity (guarding) - Abdomen is soft. Auscultation Auscultation of the abdomen reveals - Bowel sounds normal.  Male Genitourinary Note: Not done, not pertinent to present illness   Musculoskeletal Note: On exam, he is alert and oriented, in no apparent distress. His right hip has normal range of motion with no discomfort. Left hip flexion 110. Minimal internal rotation about 20 external rotation, 20 abduction. He has an antalgic gait pattern on the left side.  RADIOGRAPHS AP pelvis, lateral of the left hip show that he has near bone-on-bone arthritis in that left hip with some osteophytes and subchondral cyst. Right hip looks normal.  Assessment &  Plan  Primary osteoarthritis of left hip (M16.12)  Note:Surgical Plans: Left Total Hip Replacement - Anterior Approach  Disposition:  Home  PCP: Dr. Neta Mends Cards; Dr. Debara Pickett - Patient has been seen preoperatively and felt to be stable for surgery.  Topical TXA  Anesthesia Issues: None  Signed electronically by Ok Edwards, III PA-C

## 2016-10-17 ENCOUNTER — Encounter (HOSPITAL_COMMUNITY): Payer: Self-pay

## 2016-10-17 ENCOUNTER — Encounter (HOSPITAL_COMMUNITY)
Admission: RE | Admit: 2016-10-17 | Discharge: 2016-10-17 | Disposition: A | Discharge status: Home / Self Care | Payer: 59 | Source: Ambulatory Visit | Attending: Orthopedic Surgery | Admitting: Orthopedic Surgery

## 2016-10-17 DIAGNOSIS — M1612 Unilateral primary osteoarthritis, left hip: Secondary | ICD-10-CM | POA: Diagnosis not present

## 2016-10-17 DIAGNOSIS — Z01812 Encounter for preprocedural laboratory examination: Secondary | ICD-10-CM | POA: Diagnosis present

## 2016-10-17 HISTORY — DX: Unspecified osteoarthritis, unspecified site: M19.90

## 2016-10-17 LAB — URINALYSIS, ROUTINE W REFLEX MICROSCOPIC
BILIRUBIN URINE: NEGATIVE
GLUCOSE, UA: NEGATIVE mg/dL
HGB URINE DIPSTICK: NEGATIVE
Ketones, ur: NEGATIVE mg/dL
Leukocytes, UA: NEGATIVE
Nitrite: NEGATIVE
PH: 5.5 (ref 5.0–8.0)
Protein, ur: 30 mg/dL — AB
SPECIFIC GRAVITY, URINE: 1.026 (ref 1.005–1.030)

## 2016-10-17 LAB — COMPREHENSIVE METABOLIC PANEL
ALBUMIN: 4.5 g/dL (ref 3.5–5.0)
ALT: 24 U/L (ref 17–63)
ANION GAP: 7 (ref 5–15)
AST: 27 U/L (ref 15–41)
Alkaline Phosphatase: 46 U/L (ref 38–126)
BILIRUBIN TOTAL: 0.9 mg/dL (ref 0.3–1.2)
BUN: 27 mg/dL — ABNORMAL HIGH (ref 6–20)
CALCIUM: 9.7 mg/dL (ref 8.9–10.3)
CO2: 25 mmol/L (ref 22–32)
Chloride: 105 mmol/L (ref 101–111)
Creatinine, Ser: 1.16 mg/dL (ref 0.61–1.24)
GFR calc non Af Amer: >60 mL/min (ref >60)
GLUCOSE: 89 mg/dL (ref 65–99)
POTASSIUM: 4.2 mmol/L (ref 3.5–5.1)
SODIUM: 137 mmol/L (ref 135–145)
TOTAL PROTEIN: 7.9 g/dL (ref 6.5–8.1)

## 2016-10-17 LAB — PROTIME-INR
INR: 1.01
Prothrombin Time: 13.3 seconds (ref 11.4–15.2)

## 2016-10-17 LAB — CBC
HCT: 44.1 % (ref 39.0–52.0)
Hemoglobin: 15.3 g/dL (ref 13.0–17.0)
MCH: 29.7 pg (ref 26.0–34.0)
MCHC: 34.7 g/dL (ref 30.0–36.0)
MCV: 85.6 fL (ref 78.0–100.0)
Platelets: 281 10*3/uL (ref 150–400)
RBC: 5.15 MIL/uL (ref 4.22–5.81)
RDW: 12.8 % (ref 11.5–15.5)
WBC: 4.2 10*3/uL (ref 4.0–10.5)

## 2016-10-17 LAB — APTT: APTT: 30 s (ref 24–36)

## 2016-10-17 LAB — URINE MICROSCOPIC-ADD ON
BACTERIA UA: NONE SEEN
Squamous Epithelial / LPF: NONE SEEN
WBC, UA: NONE SEEN WBC/hpf (ref 0–5)

## 2016-10-17 LAB — ABO/RH: ABO/RH(D): A POS

## 2016-10-18 LAB — SURGICAL PCR SCREEN
MRSA, PCR: NEGATIVE
STAPHYLOCOCCUS AUREUS: POSITIVE — AB

## 2016-10-26 ENCOUNTER — Inpatient Hospital Stay (HOSPITAL_COMMUNITY): Payer: 59

## 2016-10-26 ENCOUNTER — Encounter (HOSPITAL_COMMUNITY)
Admission: RE | Disposition: A | Discharge status: Home Health | Payer: Self-pay | Source: Ambulatory Visit | Attending: Orthopedic Surgery

## 2016-10-26 ENCOUNTER — Inpatient Hospital Stay (HOSPITAL_COMMUNITY): Payer: 59 | Admitting: Anesthesiology

## 2016-10-26 ENCOUNTER — Inpatient Hospital Stay (HOSPITAL_COMMUNITY)
Admission: RE | Admit: 2016-10-26 | Discharge: 2016-10-27 | DRG: 470 | Disposition: A | Discharge status: Home Health | Payer: 59 | Source: Ambulatory Visit | Attending: Orthopedic Surgery | Admitting: Orthopedic Surgery

## 2016-10-26 ENCOUNTER — Encounter (HOSPITAL_COMMUNITY): Payer: Self-pay | Admitting: *Deleted

## 2016-10-26 DIAGNOSIS — I10 Essential (primary) hypertension: Secondary | ICD-10-CM | POA: Diagnosis present

## 2016-10-26 DIAGNOSIS — E7800 Pure hypercholesterolemia, unspecified: Secondary | ICD-10-CM | POA: Diagnosis present

## 2016-10-26 DIAGNOSIS — M25552 Pain in left hip: Secondary | ICD-10-CM | POA: Diagnosis present

## 2016-10-26 DIAGNOSIS — Z7982 Long term (current) use of aspirin: Secondary | ICD-10-CM | POA: Diagnosis not present

## 2016-10-26 DIAGNOSIS — I2510 Atherosclerotic heart disease of native coronary artery without angina pectoris: Secondary | ICD-10-CM | POA: Diagnosis present

## 2016-10-26 DIAGNOSIS — Z951 Presence of aortocoronary bypass graft: Secondary | ICD-10-CM | POA: Diagnosis not present

## 2016-10-26 DIAGNOSIS — F329 Major depressive disorder, single episode, unspecified: Secondary | ICD-10-CM | POA: Diagnosis present

## 2016-10-26 DIAGNOSIS — M1612 Unilateral primary osteoarthritis, left hip: Secondary | ICD-10-CM | POA: Diagnosis present

## 2016-10-26 DIAGNOSIS — Z79899 Other long term (current) drug therapy: Secondary | ICD-10-CM

## 2016-10-26 DIAGNOSIS — Z96649 Presence of unspecified artificial hip joint: Secondary | ICD-10-CM

## 2016-10-26 DIAGNOSIS — M169 Osteoarthritis of hip, unspecified: Secondary | ICD-10-CM

## 2016-10-26 DIAGNOSIS — F419 Anxiety disorder, unspecified: Secondary | ICD-10-CM | POA: Diagnosis present

## 2016-10-26 HISTORY — PX: TOTAL HIP ARTHROPLASTY: SHX124

## 2016-10-26 LAB — TYPE AND SCREEN
ABO/RH(D): A POS
ANTIBODY SCREEN: NEGATIVE

## 2016-10-26 SURGERY — ARTHROPLASTY, HIP, TOTAL, ANTERIOR APPROACH
Anesthesia: General | Site: Hip | Laterality: Left

## 2016-10-26 MED ORDER — HYDROMORPHONE HCL 1 MG/ML IJ SOLN
INTRAMUSCULAR | Status: DC | PRN
  Administered 2016-10-26: 1 mg via INTRAVENOUS
  Administered 2016-10-26 (×2): 0.5 mg via INTRAVENOUS

## 2016-10-26 MED ORDER — ACETAMINOPHEN 650 MG RE SUPP: 650.0000 mg | Freq: Four times a day (QID) | RECTAL | Status: DC | PRN

## 2016-10-26 MED ORDER — ROCURONIUM BROMIDE 10 MG/ML (PF) SYRINGE
PREFILLED_SYRINGE | INTRAVENOUS | Status: DC | PRN
  Administered 2016-10-26: 40 mg via INTRAVENOUS

## 2016-10-26 MED ORDER — PROPOFOL 10 MG/ML IV BOLUS
INTRAVENOUS | Status: AC
  Filled 2016-10-26: qty 60

## 2016-10-26 MED ORDER — CEFAZOLIN SODIUM-DEXTROSE 2-4 GM/100ML-% IV SOLN
2.0000 g | Freq: Four times a day (QID) | INTRAVENOUS | Status: AC
Start: 2016-10-26 — End: 2016-10-27
  Administered 2016-10-26 – 2016-10-27 (×2): 2 g via INTRAVENOUS
  Filled 2016-10-26 (×2): qty 100

## 2016-10-26 MED ORDER — CEFAZOLIN SODIUM-DEXTROSE 2-4 GM/100ML-% IV SOLN
2.0000 g | INTRAVENOUS | Status: AC
  Administered 2016-10-26: 2 g via INTRAVENOUS

## 2016-10-26 MED ORDER — LIDOCAINE 2% (20 MG/ML) 5 ML SYRINGE
INTRAMUSCULAR | Status: AC
Start: 2016-10-26 — End: 2016-10-26
  Filled 2016-10-26: qty 5

## 2016-10-26 MED ORDER — LIDOCAINE 2% (20 MG/ML) 5 ML SYRINGE
INTRAMUSCULAR | Status: DC | PRN
  Administered 2016-10-26: 50 mg via INTRAVENOUS

## 2016-10-26 MED ORDER — ONDANSETRON HCL 4 MG/2ML IJ SOLN: 4.0000 mg | Freq: Four times a day (QID) | INTRAMUSCULAR | Status: DC | PRN

## 2016-10-26 MED ORDER — LISDEXAMFETAMINE DIMESYLATE 20 MG PO CAPS
40.0000 mg | ORAL_CAPSULE | Freq: Every morning | ORAL | Status: DC
  Administered 2016-10-27: 40 mg via ORAL
  Filled 2016-10-26: qty 2

## 2016-10-26 MED ORDER — SODIUM CHLORIDE 0.9 % IV SOLN
INTRAVENOUS | Status: DC
  Administered 2016-10-26: 21:00:00 via INTRAVENOUS

## 2016-10-26 MED ORDER — METHOCARBAMOL 500 MG PO TABS
500.0000 mg | ORAL_TABLET | Freq: Four times a day (QID) | ORAL | Status: DC | PRN
  Administered 2016-10-27: 500 mg via ORAL
  Filled 2016-10-26: qty 1

## 2016-10-26 MED ORDER — FENTANYL CITRATE (PF) 100 MCG/2ML IJ SOLN
INTRAMUSCULAR | Status: DC | PRN
  Administered 2016-10-26 (×2): 50 ug via INTRAVENOUS
  Administered 2016-10-26: 100 ug via INTRAVENOUS

## 2016-10-26 MED ORDER — TRANEXAMIC ACID 1000 MG/10ML IV SOLN
INTRAVENOUS | Status: DC | PRN
  Administered 2016-10-26: 2000 mg via TOPICAL

## 2016-10-26 MED ORDER — PROPOFOL 10 MG/ML IV BOLUS
INTRAVENOUS | Status: DC | PRN
  Administered 2016-10-26: 150 mg via INTRAVENOUS

## 2016-10-26 MED ORDER — DOCUSATE SODIUM 100 MG PO CAPS
100.0000 mg | ORAL_CAPSULE | Freq: Two times a day (BID) | ORAL | Status: DC
  Administered 2016-10-26 – 2016-10-27 (×2): 100 mg via ORAL
  Filled 2016-10-26 (×2): qty 1

## 2016-10-26 MED ORDER — SUGAMMADEX SODIUM 200 MG/2ML IV SOLN
INTRAVENOUS | Status: DC | PRN
  Administered 2016-10-26: 200 mg via INTRAVENOUS

## 2016-10-26 MED ORDER — MIDAZOLAM HCL 2 MG/2ML IJ SOLN
INTRAMUSCULAR | Status: AC
  Filled 2016-10-26: qty 2

## 2016-10-26 MED ORDER — METHOCARBAMOL 1000 MG/10ML IJ SOLN
500.0000 mg | Freq: Four times a day (QID) | INTRAVENOUS | Status: DC | PRN
  Administered 2016-10-26 – 2016-10-27 (×2): 500 mg via INTRAVENOUS
  Filled 2016-10-26: qty 5
  Filled 2016-10-26: qty 550
  Filled 2016-10-26: qty 5
  Filled 2016-10-26: qty 550

## 2016-10-26 MED ORDER — PHENYLEPHRINE 40 MCG/ML (10ML) SYRINGE FOR IV PUSH (FOR BLOOD PRESSURE SUPPORT)
PREFILLED_SYRINGE | INTRAVENOUS | Status: AC
  Filled 2016-10-26: qty 10

## 2016-10-26 MED ORDER — HYDROMORPHONE HCL 1 MG/ML IJ SOLN
INTRAMUSCULAR | Status: AC
  Administered 2016-10-26: 0.5 mg via INTRAVENOUS
  Filled 2016-10-26: qty 1

## 2016-10-26 MED ORDER — ONDANSETRON HCL 4 MG/2ML IJ SOLN
INTRAMUSCULAR | Status: AC
  Filled 2016-10-26: qty 2

## 2016-10-26 MED ORDER — ACETAMINOPHEN 500 MG PO TABS
1000.0000 mg | ORAL_TABLET | Freq: Four times a day (QID) | ORAL | Status: DC
  Administered 2016-10-26 – 2016-10-27 (×3): 1000 mg via ORAL
  Filled 2016-10-26 (×3): qty 2

## 2016-10-26 MED ORDER — ONDANSETRON HCL 4 MG PO TABS: 4.0000 mg | ORAL_TABLET | Freq: Four times a day (QID) | ORAL | Status: DC | PRN

## 2016-10-26 MED ORDER — DEXAMETHASONE SODIUM PHOSPHATE 10 MG/ML IJ SOLN
INTRAMUSCULAR | Status: AC
Start: 2016-10-26 — End: 2016-10-26
  Filled 2016-10-26: qty 1

## 2016-10-26 MED ORDER — ZOLPIDEM TARTRATE 10 MG PO TABS
10.0000 mg | ORAL_TABLET | Freq: Every evening | ORAL | Status: DC | PRN
  Administered 2016-10-26: 10 mg via ORAL
  Filled 2016-10-26: qty 1

## 2016-10-26 MED ORDER — HYDROMORPHONE HCL 1 MG/ML IJ SOLN
0.2500 mg | INTRAMUSCULAR | Status: AC | PRN
  Administered 2016-10-26 (×8): 0.5 mg via INTRAVENOUS

## 2016-10-26 MED ORDER — PHENOL 1.4 % MT LIQD: 1.0000 | OROMUCOSAL | Status: DC | PRN

## 2016-10-26 MED ORDER — RIVAROXABAN 10 MG PO TABS
10.0000 mg | ORAL_TABLET | Freq: Every day | ORAL | Status: DC
  Administered 2016-10-27: 10 mg via ORAL
  Filled 2016-10-26 (×2): qty 1

## 2016-10-26 MED ORDER — BUPIVACAINE HCL (PF) 0.25 % IJ SOLN
INTRAMUSCULAR | Status: DC | PRN
  Administered 2016-10-26: 30 mL

## 2016-10-26 MED ORDER — DEXAMETHASONE SODIUM PHOSPHATE 10 MG/ML IJ SOLN
10.0000 mg | Freq: Once | INTRAMUSCULAR | Status: AC
  Administered 2016-10-26: 10 mg via INTRAVENOUS

## 2016-10-26 MED ORDER — METOPROLOL TARTRATE 25 MG PO TABS
25.0000 mg | ORAL_TABLET | Freq: Two times a day (BID) | ORAL | Status: DC
Start: 2016-10-26 — End: 2016-10-27
  Administered 2016-10-26 – 2016-10-27 (×2): 25 mg via ORAL
  Filled 2016-10-26 (×2): qty 1

## 2016-10-26 MED ORDER — BUPIVACAINE HCL (PF) 0.25 % IJ SOLN
INTRAMUSCULAR | Status: AC
  Filled 2016-10-26: qty 30

## 2016-10-26 MED ORDER — HYDROMORPHONE HCL 1 MG/ML IJ SOLN: 0.2500 mg | INTRAMUSCULAR | Status: DC | PRN

## 2016-10-26 MED ORDER — ONDANSETRON HCL 4 MG/2ML IJ SOLN
INTRAMUSCULAR | Status: DC | PRN
  Administered 2016-10-26: 4 mg via INTRAVENOUS

## 2016-10-26 MED ORDER — CHLORHEXIDINE GLUCONATE 4 % EX LIQD: 60.0000 mL | Freq: Once | CUTANEOUS | Status: DC

## 2016-10-26 MED ORDER — FENTANYL CITRATE (PF) 100 MCG/2ML IJ SOLN
INTRAMUSCULAR | Status: AC
  Filled 2016-10-26: qty 2

## 2016-10-26 MED ORDER — LABETALOL HCL 5 MG/ML IV SOLN
INTRAVENOUS | Status: AC
  Filled 2016-10-26: qty 4

## 2016-10-26 MED ORDER — PROMETHAZINE HCL 25 MG/ML IJ SOLN: 6.2500 mg | INTRAMUSCULAR | Status: DC | PRN

## 2016-10-26 MED ORDER — MIDAZOLAM HCL 5 MG/5ML IJ SOLN
INTRAMUSCULAR | Status: DC | PRN
  Administered 2016-10-26: 2 mg via INTRAVENOUS

## 2016-10-26 MED ORDER — ACETAMINOPHEN 325 MG PO TABS: 650.0000 mg | ORAL_TABLET | Freq: Four times a day (QID) | ORAL | Status: DC | PRN

## 2016-10-26 MED ORDER — EZETIMIBE 10 MG PO TABS
10.0000 mg | ORAL_TABLET | Freq: Every day | ORAL | Status: DC
  Administered 2016-10-27: 10 mg via ORAL
  Filled 2016-10-26: qty 1

## 2016-10-26 MED ORDER — PHENYLEPHRINE HCL 10 MG/ML IJ SOLN
INTRAMUSCULAR | Status: DC | PRN
  Administered 2016-10-26 (×2): 40 ug via INTRAVENOUS
  Administered 2016-10-26: 80 ug via INTRAVENOUS

## 2016-10-26 MED ORDER — TRAMADOL HCL 50 MG PO TABS: 50.0000 mg | ORAL_TABLET | Freq: Four times a day (QID) | ORAL | Status: DC | PRN

## 2016-10-26 MED ORDER — DIPHENHYDRAMINE HCL 12.5 MG/5ML PO ELIX: 12.5000 mg | ORAL_SOLUTION | ORAL | Status: DC | PRN

## 2016-10-26 MED ORDER — DEXAMETHASONE SODIUM PHOSPHATE 10 MG/ML IJ SOLN
10.0000 mg | Freq: Once | INTRAMUSCULAR | Status: AC
  Administered 2016-10-27: 10 mg via INTRAVENOUS
  Filled 2016-10-26: qty 1

## 2016-10-26 MED ORDER — LACTATED RINGERS IV SOLN
INTRAVENOUS | Status: DC
  Administered 2016-10-26: 1000 mL via INTRAVENOUS
  Administered 2016-10-26: 18:00:00 via INTRAVENOUS

## 2016-10-26 MED ORDER — SUGAMMADEX SODIUM 200 MG/2ML IV SOLN
INTRAVENOUS | Status: AC
  Filled 2016-10-26: qty 2

## 2016-10-26 MED ORDER — ACETAMINOPHEN 10 MG/ML IV SOLN
INTRAVENOUS | Status: AC
  Filled 2016-10-26: qty 100

## 2016-10-26 MED ORDER — MENTHOL 3 MG MT LOZG: 1.0000 | LOZENGE | OROMUCOSAL | Status: DC | PRN

## 2016-10-26 MED ORDER — BISACODYL 10 MG RE SUPP: 10.0000 mg | Freq: Every day | RECTAL | Status: DC | PRN

## 2016-10-26 MED ORDER — METOCLOPRAMIDE HCL 5 MG/ML IJ SOLN: 5.0000 mg | Freq: Three times a day (TID) | INTRAMUSCULAR | Status: DC | PRN

## 2016-10-26 MED ORDER — ACETAMINOPHEN 10 MG/ML IV SOLN
1000.0000 mg | Freq: Once | INTRAVENOUS | Status: AC
  Administered 2016-10-26: 1000 mg via INTRAVENOUS

## 2016-10-26 MED ORDER — CEFAZOLIN SODIUM-DEXTROSE 2-4 GM/100ML-% IV SOLN
INTRAVENOUS | Status: AC
  Filled 2016-10-26: qty 100

## 2016-10-26 MED ORDER — HYDROMORPHONE HCL 2 MG/ML IJ SOLN
INTRAMUSCULAR | Status: AC
  Filled 2016-10-26: qty 1

## 2016-10-26 MED ORDER — ROCURONIUM BROMIDE 50 MG/5ML IV SOSY
PREFILLED_SYRINGE | INTRAVENOUS | Status: AC
Start: 2016-10-26 — End: 2016-10-26
  Filled 2016-10-26: qty 5

## 2016-10-26 MED ORDER — HYDROMORPHONE HCL 1 MG/ML IJ SOLN
0.2500 mg | INTRAMUSCULAR | Status: DC | PRN
  Administered 2016-10-26 (×2): 0.5 mg via INTRAVENOUS

## 2016-10-26 MED ORDER — MORPHINE SULFATE (PF) 2 MG/ML IV SOLN
1.0000 mg | INTRAVENOUS | Status: DC | PRN
  Administered 2016-10-26 – 2016-10-27 (×2): 1 mg via INTRAVENOUS
  Filled 2016-10-26 (×2): qty 1

## 2016-10-26 MED ORDER — SODIUM CHLORIDE 0.9 % IV SOLN
2000.0000 mg | Freq: Once | INTRAVENOUS | Status: DC
  Filled 2016-10-26: qty 20

## 2016-10-26 MED ORDER — METOCLOPRAMIDE HCL 5 MG PO TABS: 5.0000 mg | ORAL_TABLET | Freq: Three times a day (TID) | ORAL | Status: DC | PRN

## 2016-10-26 MED ORDER — OXYCODONE HCL 5 MG PO TABS
5.0000 mg | ORAL_TABLET | ORAL | Status: DC | PRN
  Administered 2016-10-26 – 2016-10-27 (×6): 10 mg via ORAL
  Filled 2016-10-26 (×6): qty 2

## 2016-10-26 MED ORDER — POLYETHYLENE GLYCOL 3350 17 G PO PACK: 17.0000 g | PACK | Freq: Every day | ORAL | Status: DC | PRN

## 2016-10-26 MED ORDER — ESMOLOL HCL 100 MG/10ML IV SOLN
INTRAVENOUS | Status: AC
  Filled 2016-10-26: qty 10

## 2016-10-26 MED ORDER — FLEET ENEMA 7-19 GM/118ML RE ENEM: 1.0000 | ENEMA | Freq: Once | RECTAL | Status: DC | PRN

## 2016-10-26 MED ORDER — GEMFIBROZIL 600 MG PO TABS
600.0000 mg | ORAL_TABLET | Freq: Two times a day (BID) | ORAL | Status: DC
  Administered 2016-10-26 – 2016-10-27 (×2): 600 mg via ORAL
  Filled 2016-10-26 (×2): qty 1

## 2016-10-26 SURGICAL SUPPLY — 34 items
BAG DECANTER FOR FLEXI CONT (MISCELLANEOUS) ×2 IMPLANT
BAG ZIPLOCK 12X15 (MISCELLANEOUS) IMPLANT
BLADE SAG 18X100X1.27 (BLADE) ×2 IMPLANT
CAPT HIP TOTAL 2 ×2 IMPLANT
CLOTH BEACON ORANGE TIMEOUT ST (SAFETY) ×2 IMPLANT
COVER PERINEAL POST (MISCELLANEOUS) ×2 IMPLANT
DECANTER SPIKE VIAL GLASS SM (MISCELLANEOUS) ×2 IMPLANT
DRAPE STERI IOBAN 125X83 (DRAPES) ×2 IMPLANT
DRAPE U-SHAPE 47X51 STRL (DRAPES) ×4 IMPLANT
DRSG ADAPTIC 3X8 NADH LF (GAUZE/BANDAGES/DRESSINGS) ×2 IMPLANT
DRSG MEPILEX BORDER 4X4 (GAUZE/BANDAGES/DRESSINGS) ×2 IMPLANT
DRSG MEPILEX BORDER 4X8 (GAUZE/BANDAGES/DRESSINGS) ×2 IMPLANT
DURAPREP 26ML APPLICATOR (WOUND CARE) ×2 IMPLANT
ELECT REM PT RETURN 9FT ADLT (ELECTROSURGICAL) ×2
ELECTRODE REM PT RTRN 9FT ADLT (ELECTROSURGICAL) ×1 IMPLANT
EVACUATOR 1/8 PVC DRAIN (DRAIN) ×2 IMPLANT
GLOVE BIO SURGEON STRL SZ7.5 (GLOVE) ×2 IMPLANT
GLOVE BIO SURGEON STRL SZ8 (GLOVE) ×4 IMPLANT
GLOVE BIOGEL PI IND STRL 8 (GLOVE) ×2 IMPLANT
GLOVE BIOGEL PI INDICATOR 8 (GLOVE) ×2
GOWN STRL REUS W/TWL LRG LVL3 (GOWN DISPOSABLE) ×2 IMPLANT
GOWN STRL REUS W/TWL XL LVL3 (GOWN DISPOSABLE) ×2 IMPLANT
NS IRRIG 1000ML POUR BTL (IV SOLUTION) ×2 IMPLANT
PACK ANTERIOR HIP CUSTOM (KITS) ×2 IMPLANT
STRIP CLOSURE SKIN 1/2X4 (GAUZE/BANDAGES/DRESSINGS) ×2 IMPLANT
SUT ETHIBOND NAB CT1 #1 30IN (SUTURE) ×2 IMPLANT
SUT MNCRL AB 4-0 PS2 18 (SUTURE) ×2 IMPLANT
SUT VIC AB 2-0 CT1 27 (SUTURE) ×3
SUT VIC AB 2-0 CT1 TAPERPNT 27 (SUTURE) ×3 IMPLANT
SUT VLOC 180 0 24IN GS25 (SUTURE) ×2 IMPLANT
SYR 50ML LL SCALE MARK (SYRINGE) IMPLANT
TRAY FOLEY W/METER SILVER 16FR (SET/KITS/TRAYS/PACK) ×2 IMPLANT
WATER STERILE IRR 1000ML POUR (IV SOLUTION) ×4 IMPLANT
YANKAUER SUCT BULB TIP 10FT TU (MISCELLANEOUS) ×2 IMPLANT

## 2016-10-26 NOTE — Anesthesia Preprocedure Evaluation (Addendum)
Anesthesia Evaluation  Patient identified by MRN, date of birth, ID band Patient awake    Reviewed: Allergy & Precautions, NPO status , Patient's Chart, lab work & pertinent test results, reviewed documented beta blocker date and time   History of Anesthesia Complications Negative for: history of anesthetic complications  Airway Mallampati: II  TM Distance: >3 FB Neck ROM: Full    Dental no notable dental hx. (+) Dental Advisory Given   Pulmonary former smoker,    Pulmonary exam normal        Cardiovascular hypertension, + CAD, + CABG and + Peripheral Vascular Disease  Normal cardiovascular exam  02/2016 Echo: EF 50-55%, no rwma, mildly dil LA, nl RV fxn, mildly dil RA.   Neuro/Psych PSYCHIATRIC DISORDERS Depression negative neurological ROS     GI/Hepatic negative GI ROS, Neg liver ROS,   Endo/Other  negative endocrine ROS  Renal/GU negative Renal ROS     Musculoskeletal  (+) Arthritis ,   Abdominal   Peds  Hematology negative hematology ROS (+)   Anesthesia Other Findings   Reproductive/Obstetrics                            Anesthesia Physical Anesthesia Plan  ASA: III  Anesthesia Plan: General   Post-op Pain Management:    Induction: Intravenous and Rapid sequence  Airway Management Planned: Oral ETT  Additional Equipment:   Intra-op Plan:   Post-operative Plan: Extubation in OR  Informed Consent: I have reviewed the patients History and Physical, chart, labs and discussed the procedure including the risks, benefits and alternatives for the proposed anesthesia with the patient or authorized representative who has indicated his/her understanding and acceptance.   Dental advisory given  Plan Discussed with: CRNA and Anesthesiologist  Anesthesia Plan Comments:        Anesthesia Quick Evaluation

## 2016-10-26 NOTE — Transfer of Care (Signed)
Immediate Anesthesia Transfer of Care Note  Patient: Kenneth [name]  Procedure(s) Performed: Procedure(s): LEFT TOTAL HIP ARTHROPLASTY ANTERIOR APPROACH (Left)  Patient Location: PACU  Anesthesia Type:General  Level of Consciousness:  sedated, patient cooperative and responds to stimulation  Airway & Oxygen Therapy:Patient Spontanous Breathing and Patient connected to face mask oxgen  Post-op Assessment:  Report given to PACU RN and Post -op Vital signs reviewed and stable  Post vital signs:  Reviewed and stable  Last Vitals:  Vitals:   10/26/16 1440  BP: 131/90  Pulse: (!) 51  Resp: 16  Temp: 123XX123 C    Complications: No apparent anesthesia complications

## 2016-10-26 NOTE — Anesthesia Postprocedure Evaluation (Signed)
Anesthesia Post Note  Patient: Kenneth [name]  Procedure(s) Performed: Procedure(s) (LRB): LEFT TOTAL HIP ARTHROPLASTY ANTERIOR APPROACH (Left)  Patient location during evaluation: PACU Anesthesia Type: General Level of consciousness: sedated Pain management: pain level controlled Vital Signs Assessment: post-procedure vital signs reviewed and stable Respiratory status: spontaneous breathing and respiratory function stable Cardiovascular status: stable Anesthetic complications: no    Last Vitals:  Vitals:   10/26/16 1930 10/26/16 1957  BP: (!) 144/81 136/70  Pulse: 67 61  Resp: 14 20  Temp: 36.7 C 36.5 C    Last Pain:  Vitals:   10/26/16 1930  TempSrc:   PainSc: [place]

## 2016-10-26 NOTE — Anesthesia Procedure Notes (Signed)
Procedure Name: Intubation Date/Time: 10/26/2016 4:46 PM Performed by: Anne Fu Pre-anesthesia Checklist: Patient identified, Emergency Drugs available, Suction available, Patient being monitored and Timeout performed Patient Re-evaluated:Patient Re-evaluated prior to inductionOxygen Delivery Method: Circle system utilized Preoxygenation: Pre-oxygenation with 100% oxygen Intubation Type: IV induction Ventilation: Mask ventilation without difficulty Laryngoscope Size: Mac and 4 Grade View: Grade II Tube type: Oral Tube size: 7.5 mm Number of attempts: 1 Airway Equipment and Method: Stylet Placement Confirmation: ETT inserted through vocal cords under direct vision,  positive ETCO2,  CO2 detector and breath sounds checked- equal and bilateral Secured at: 23 cm Tube secured with: Tape Dental Injury: Teeth and Oropharynx as per pre-operative assessment

## 2016-10-26 NOTE — Interval H&P Note (Signed)
History and Physical Interval Note:  10/26/2016 3:57 PM  Kenneth [name]  has presented today for surgery, with the diagnosis of LEFT HIP OA  The various methods of treatment have been discussed with the patient and family. After consideration of risks, benefits and other options for treatment, the patient has consented to  Procedure(s): LEFT TOTAL HIP ARTHROPLASTY ANTERIOR APPROACH (Left) as a surgical intervention .  The patient's history has been reviewed, patient examined, no change in status, stable for surgery.  I have reviewed the patient's chart and labs.  Questions were answered to the patient's satisfaction.     Gearlean Alf

## 2016-10-26 NOTE — H&P (View-Only) (Signed)
Kenneth [name] DOB: [birth year] Married / Language: English / Race: White Male Date of Admission;  10/26/2016 CC:  Left Hip Pain History of Present Illness The patient is a 50 year old male who comes in for a preoperative History and Physical. The patient is scheduled for a left total hip arthroplasty (anterior) to be performed by Dr. Dione Plover. [name], MD at [facility] on 10-26-2016. The patient is a 50 year old male who presented with a hip problem. The patient reports left hip problems including pain symptoms that have been present for 5 year(s). Symptoms reported include hip pain, night pain (occasionally), stiffness, difficulty flexing hip, difficulty rotating hip and difficulty ambulating The patient does not report any radiation of symptoms. The patient describes the hip problem as aching. Onset of symptoms was gradual. The patient feels as if their symptoms are does feel they are worsening. Symptoms are exacerbated by weight bearing and walking. Current treatment includes non-opioid analgesics (Tylenol). He has been involved with martial arts for several years but has had to stop that because of the left hip. He has deep groin pain but with no radiation. He reprots that any activity that requires internal or external rotation of the hip is excruitiating. No previous injections or surgeries. He states that the hip has gotten progressively worse over time. Pains in the groin and buttock radiating down his thigh to his knee. He has lost more movement over time. He attributes this to his long history of martial arts. He is not having any right hip pain at this time. The left hip is definitely limiting what he can and cannot do. He would like to go ahead and proceed with surgery at this time. They have been treated conservatively in the past for the above stated problem and despite conservative measures, they continue to have progressive pain and severe functional limitations and dysfunction.  They have failed non-operative management including home exercise, medications. It is felt that they would benefit from undergoing total joint replacement. Risks and benefits of the procedure have been discussed with the patient and they elect to proceed with surgery. There are no active contraindications to surgery such as ongoing infection or rapidly progressive neurological disease.  Problem List/Past Medical  Closed displaced fracture of neck of third metacarpal bone of left hand with routine healing [id number]  Primary osteoarthritis of left hip (M16.12)  Pain of left hip joint (M25.552)  Anxiety Disorder  Coronary artery disease  Depression  High blood pressure  Hypercholesterolemia  Kidney Stone  exema Finger stiffness, right (M25.641)  02/03/16 ORIF metacarpal right 3rd digit and metacarpal fracture without ORIF to 4th  Allergies Tetracycline HCl *Tetracyclines**  Sickness  Family History Cancer  Father, Mother. Cerebrovascular Accident  Mother. Chronic Obstructive Lung Disease  Mother. Congestive Heart Failure  Mother. Depression  Brother, Father, Sister. Diabetes Mellitus  Mother. Drug / Alcohol Addiction  child First Degree Relatives  reported Heart Disease  Brother, Father, Mother, Sister. Heart disease in male family member before age 10  Heart disease in male family member before age 76  Hypertension  Brother, Mother, Sister. Osteoarthritis  Mother.  Social History  Children  5 or more Current work status  unemployed Drug/Alcohol Rehab (Currently)  Exercise  Exercises never Former drinker  01/26/2016: In the past drank beer, wine and hard liquor only occasionally per week History of drug/alcohol rehab  Living situation  live with spouse Marital status  married Not under pain contract  Number of flights  of stairs before winded  2-3 Previously addicted to/Dependent on drugs or pain medications  Tobacco / smoke exposure   01/26/2016: yes Tobacco use  Never smoker. 01/26/2016: uses less than 1/2 can(s) smokeless per week  Medication History TraMADol HCl (50MG  Tablet, 1-2 Tablet Oral every 6-8 hours as needed for pain, Taken starting 10/10/2016) Active. (called to [facility] ([id number]) Atorvastatin Calcium (80MG  Tablet, Oral) Active. Vyvanse (40MG  Capsule, Oral) Active. Zetia (10MG  Tablet, Oral) Active. Lisinopril (10MG  Tablet, Oral) Active. Meloxicam (15MG  Tablet, 1 (one) Tablet Oral daily, Taken starting 07/12/2016) Active. Metoprolol Tartrate (25MG  Tablet, Oral) Active. Aspirin EC (325MG  Tablet DR, Oral) Active. Gemfibrozil (600MG  Tablet, Oral) Active. Testosterone Cypionate (200MG /ML Solution, Intramuscular) Active.  Past Surgical History CABG  Date: 2015. 4 Vessels Left Shoulder  Date: 2001. Right Kidney  Date: 12. Left Hand  Date: 2017.   Review of Systems General Present- Fatigue. Not Present- Chills, Fever, Memory Loss, Night Sweats, Weight Gain and Weight Loss. Skin Not Present- Eczema, Hives, Itching, Lesions and Rash. HEENT Not Present- Dentures, Double Vision, Headache, Hearing Loss, Tinnitus and Visual Loss. Respiratory Not Present- Allergies, Chronic Cough, Coughing up blood, Shortness of breath at rest and Shortness of breath with exertion. Cardiovascular Not Present- Chest Pain, Difficulty Breathing Lying Down, Murmur, Palpitations, Racing/skipping heartbeats and Swelling. Gastrointestinal Not Present- Abdominal Pain, Bloody Stool, Constipation, Diarrhea, Difficulty Swallowing, Heartburn, Jaundice, Loss of appetitie, Nausea and Vomiting. Male Genitourinary Not Present- Blood in Urine, Discharge, Flank Pain, Incontinence, Painful Urination, Urgency, Urinary frequency, Urinary Retention, Urinating at Night and Weak urinary stream. Musculoskeletal Present- Back Pain and Joint Pain. Not Present- Joint Swelling, Morning Stiffness, Muscle Pain, Muscle Weakness and  Spasms. Neurological Not Present- Blackout spells, Difficulty with balance, Dizziness, Paralysis, Tremor and Weakness. Psychiatric Not Present- Insomnia.  Vitals Weight: 225 lb Height: 69in Weight was reported by patient. Height was reported by patient. Body Surface Area: 2.17 m Body Mass Index: 33.23 kg/m  Pulse: 56 (Regular)  BP: 132/82 (Sitting, Right Arm, Standard   Physical Exam General Mental Status -Alert, cooperative and good historian. General Appearance-pleasant, Not in acute distress. Orientation-Oriented X3. Build & Nutrition-Well nourished and Well developed.  Head and Neck Head-normocephalic, atraumatic . Neck Global Assessment - supple, no bruit auscultated on the right, no bruit auscultated on the left.  Eye Pupil - Bilateral-Regular and Round. Motion - Bilateral-EOMI.  Chest and Lung Exam Auscultation Breath sounds - clear at anterior chest wall and clear at posterior chest wall. Adventitious sounds - No Adventitious sounds.  Cardiovascular Auscultation Rhythm - Regular rate and rhythm. Heart Sounds - S1 WNL and S2 WNL. Murmurs & Other Heart Sounds - Auscultation of the heart reveals - No Murmurs.  Abdomen Palpation/Percussion Tenderness - Abdomen is non-tender to palpation. Rigidity (guarding) - Abdomen is soft. Auscultation Auscultation of the abdomen reveals - Bowel sounds normal.  Male Genitourinary Note: Not done, not pertinent to present illness   Musculoskeletal Note: On exam, he is alert and oriented, in no apparent distress. His right hip has normal range of motion with no discomfort. Left hip flexion 110. Minimal internal rotation about 20 external rotation, 20 abduction. He has an antalgic gait pattern on the left side.  RADIOGRAPHS AP pelvis, lateral of the left hip show that he has near bone-on-bone arthritis in that left hip with some osteophytes and subchondral cyst. Right hip looks normal.  Assessment &  Plan  Primary osteoarthritis of left hip (M16.12)  Note:Surgical Plans: Left Total Hip Replacement - Anterior Approach  Disposition:  Home  PCP: Dr. Neta Mends Cards; Dr. Debara Pickett - Patient has been seen preoperatively and felt to be stable for surgery.  Topical TXA  Anesthesia Issues: None  Signed electronically by Ok Edwards, III PA-C

## 2016-10-26 NOTE — Op Note (Signed)
OPERATIVE REPORT- TOTAL HIP ARTHROPLASTY   PREOPERATIVE DIAGNOSIS: Osteoarthritis of the Left hip.   POSTOPERATIVE DIAGNOSIS: Osteoarthritis of the Left  hip.   PROCEDURE: Left total hip arthroplasty, anterior approach.   SURGEON: Gaynelle Arabian, MD   ASSISTANT: Arlee Muslim, PA-C  ANESTHESIA:  General  ESTIMATED BLOOD LOSS:-600 ml    DRAINS: Hemovac x1.   COMPLICATIONS: None   CONDITION: PACU - hemodynamically stable.   BRIEF CLINICAL NOTE: Kenneth [name] is a 50 y.o. male who has advanced end-  stage arthritis of their Left  hip with progressively worsening pain and  dysfunction.The patient has failed nonoperative management and presents for  total hip arthroplasty.   PROCEDURE IN DETAIL: After successful administration of spinal  anesthetic, the traction boots for the [facility] bed were placed on both  feet and the patient was placed onto the [facility] bed, boots placed into the leg  holders. The Left hip was then isolated from the perineum with plastic  drapes and prepped and draped in the usual sterile fashion. ASIS and  greater trochanter were marked and a oblique incision was made, starting  at about 1 cm lateral and 2 cm distal to the ASIS and coursing towards  the anterior cortex of the femur. The skin was cut with a 10 blade  through subcutaneous tissue to the level of the fascia overlying the  tensor fascia lata muscle. The fascia was then incised in line with the  incision at the junction of the anterior third and posterior 2/3rd. The  muscle was teased off the fascia and then the interval between the TFL  and the rectus was developed. The Hohmann retractor was then placed at  the top of the femoral neck over the capsule. The vessels overlying the  capsule were cauterized and the fat on top of the capsule was removed.  A Hohmann retractor was then placed anterior underneath the rectus  femoris to give exposure to the entire anterior capsule. A T-shaped   capsulotomy was performed. The edges were tagged and the femoral head  was identified.       Osteophytes are removed off the superior acetabulum.  The femoral neck was then cut in situ with an oscillating saw. Traction  was then applied to the left lower extremity utilizing the [facility]  traction. The femoral head was then removed. Retractors were placed  around the acetabulum and then circumferential removal of the labrum was  performed. Osteophytes were also removed. Reaming starts at 47 mm to  medialize and  Increased in 2 mm increments to 51 mm. We reamed in  approximately 40 degrees of abduction, 20 degrees anteversion. A 52 mm  pinnacle acetabular shell was then impacted in anatomic position under  fluoroscopic guidance with excellent purchase. We did not need to place  any additional dome screws. A 32 mm neutral + 4 marathon liner was then  placed into the acetabular shell.       The femoral lift was then placed along the lateral aspect of the femur  just distal to the vastus ridge. The leg was  externally rotated and capsule  was stripped off the inferior aspect of the femoral neck down to the  level of the lesser trochanter, this was done with electrocautery. The femur was lifted after this was performed. The  leg was then placed in an extended and adducted position essentially delivering the femur. We also removed the capsule superiorly and the piriformis from the piriformis fossa  to gain excellent exposure of the  proximal femur. Rongeur was used to remove some cancellous bone to get  into the lateral portion of the proximal femur for placement of the  initial starter reamer. The starter broaches was placed  the starter broach  and was shown to go down the center of the canal. Broaching  with the  Corail system was then performed starting at size 8, coursing  Up to size 11. A size 11 had excellent torsional and rotational  and axial stability. The trial high offset neck was then  placed  with a 32 + 1 trial head. The hip was then reduced. We confirmed that  the stem was in the canal both on AP and lateral x-rays. It also has excellent sizing. The hip was reduced with outstanding stability through full extension and full external rotation.. AP pelvis was taken and the leg lengths were measured and found to be equal. Hip was then dislocated again and the femoral head and neck removed. The  femoral broach was removed. Size 11 Corail stem with a high offset  neck was then impacted into the femur following native anteversion. Has  excellent purchase in the canal. Excellent torsional and rotational and  axial stability. It is confirmed to be in the canal on AP and lateral  fluoroscopic views. The 32 + 1 ceramic head was placed and the hip  reduced with outstanding stability. Again AP pelvis was taken and it  confirmed that the leg lengths were equal. The wound was then copiously  irrigated with saline solution and the capsule reattached and repaired  with Ethibond suture. 30 ml of .25% Bupivicaine was  injected into the capsule and into the edge of the tensor fascia lata as well as subcutaneous tissue. The fascia overlying the tensor fascia lata was then closed with a running #1 V-Loc. Subcu was closed with interrupted 2-0 Vicryl and subcuticular running 4-0 Monocryl. Incision was cleaned  and dried. Steri-Strips and a bulky sterile dressing applied. Hemovac  drain was hooked to suction and then the patient was awakened and transported to  recovery in stable condition.        Please note that a surgical assistant was a medical necessity for this procedure to perform it in a safe and expeditious manner. Assistant was necessary to provide appropriate retraction of vital neurovascular structures and to prevent femoral fracture and allow for anatomic placement of the prosthesis.  Gaynelle Arabian, M.D.

## 2016-10-27 ENCOUNTER — Encounter (HOSPITAL_COMMUNITY): Payer: Self-pay | Admitting: Orthopedic Surgery

## 2016-10-27 LAB — CBC
HCT: 39.5 % (ref 39.0–52.0)
Hemoglobin: 13.7 g/dL (ref 13.0–17.0)
MCH: 29.6 pg (ref 26.0–34.0)
MCHC: 34.7 g/dL (ref 30.0–36.0)
MCV: 85.3 fL (ref 78.0–100.0)
PLATELETS: 210 10*3/uL (ref 150–400)
RBC: 4.63 MIL/uL (ref 4.22–5.81)
RDW: 13.1 % (ref 11.5–15.5)
WBC: 11.4 10*3/uL — AB (ref 4.0–10.5)

## 2016-10-27 LAB — BASIC METABOLIC PANEL
ANION GAP: 7 (ref 5–15)
BUN: 22 mg/dL — AB (ref 6–20)
CALCIUM: 8.7 mg/dL — AB (ref 8.9–10.3)
CO2: 24 mmol/L (ref 22–32)
Chloride: 104 mmol/L (ref 101–111)
Creatinine, Ser: 1.22 mg/dL (ref 0.61–1.24)
GFR calc Af Amer: >60 mL/min (ref >60)
Glucose, Bld: 161 mg/dL — ABNORMAL HIGH (ref 65–99)
POTASSIUM: 4.3 mmol/L (ref 3.5–5.1)
SODIUM: 135 mmol/L (ref 135–145)

## 2016-10-27 MED ORDER — RIVAROXABAN 10 MG PO TABS: 10.0000 mg | ORAL_TABLET | Freq: Every day | ORAL | 0 refills | Status: DC

## 2016-10-27 MED ORDER — METHOCARBAMOL 500 MG PO TABS: 500.0000 mg | ORAL_TABLET | Freq: Four times a day (QID) | ORAL | 0 refills | Status: DC | PRN

## 2016-10-27 MED ORDER — TRAMADOL HCL 50 MG PO TABS: 50.0000 mg | ORAL_TABLET | Freq: Four times a day (QID) | ORAL | 1 refills | Status: DC | PRN

## 2016-10-27 MED ORDER — OXYCODONE HCL 5 MG PO TABS
5.0000 mg | ORAL_TABLET | ORAL | 0 refills | Status: DC | PRN
Start: 2016-10-27 — End: 2018-01-24

## 2016-10-27 NOTE — Evaluation (Signed)
Physical Therapy Evaluation Patient Details Name: Kenneth [name] MRN: [id number] DOB: 1966-06-20 Today's Date: 10/27/2016   History of Present Illness  Pt s/p L THR  and with hx of CAD and CABG  Clinical Impression  Pt s/p L THR presents with decreased L LE strength/ROM and post op pain limiting functional mobility.  Pt should progress to dc home with family assist.    Follow Up Recommendations Home health PT    Equipment Recommendations  Rolling walker with 5" wheels    Recommendations for Other Services OT consult     Precautions / Restrictions Precautions Precautions: Fall Restrictions Weight Bearing Restrictions: No LLE Weight Bearing: Weight bearing as tolerated      Mobility  Bed Mobility Overal bed mobility: Needs Assistance Bed Mobility: Supine to Sit     Supine to sit: Min assist     General bed mobility comments: cues for sequence and use of R LE to self assist  Transfers Overall transfer level: Needs assistance Equipment used: Rolling walker (2 wheeled) Transfers: Sit to/from Stand Sit to Stand: Min assist         General transfer comment: cues for LE management and use of UEs to self assist  Ambulation/Gait Ambulation/Gait assistance: Min assist;Min guard Ambulation Distance (Feet): 123 Feet Assistive device: Rolling walker (2 wheeled) Gait Pattern/deviations: Step-to pattern;Step-through pattern;Decreased step length - right;Decreased step length - left;Shuffle;Trunk flexed Gait velocity: decr Gait velocity interpretation: Below normal speed for age/gender General Gait Details: cues for sequence, posture and position from [employer]            Wheelchair Mobility    Modified Rankin (Stroke Patients Only)       Balance                                             Pertinent Vitals/Pain Pain Assessment: 0-10 Pain Score: 4  Pain Location: L hip Pain Descriptors / Indicators: Aching;Sore Pain Intervention(s):  Limited activity within patient's tolerance;Monitored during session;Premedicated before session;Ice applied    Home Living Family/patient expects to be discharged to:: Private residence Living Arrangements: Spouse/significant other Available Help at Discharge: Family Type of Home: House Home Access: Stairs to enter Entrance Stairs-Rails: Chemical engineer of Steps: 2 Home Layout: Two level [place]: Cane - single point      Prior Function Level of Independence: Independent               Hand Dominance        Extremity/Trunk Assessment   Upper Extremity Assessment: Overall WFL for tasks assessed           Lower Extremity Assessment: LLE deficits/detail   LLE Deficits / Details: Strength at hip 2+/5 with AAROM at hip to 80 flex and 15 abd  Cervical / Trunk Assessment: Normal  Communication   Communication: No difficulties  Cognition Arousal/Alertness: Awake/alert Behavior During Therapy: WFL for tasks assessed/performed Overall Cognitive Status: Within Functional Limits for tasks assessed                      General Comments      Exercises Total Joint Exercises Ankle Circles/Pumps: AROM;Both;10 reps;Supine Quad Sets: AROM;Both;10 reps;Supine Heel Slides: AAROM;Left;20 reps;Supine Hip ABduction/ADduction: AAROM;Left;15 reps;Supine   Assessment/Plan    PT Assessment Patient needs continued PT services  PT Problem List Decreased activity tolerance;Decreased range of motion;Decreased  strength;Decreased mobility;Decreased knowledge of use of DME;Pain          PT Treatment Interventions DME instruction;Gait training;Stair training;Functional mobility training;Therapeutic activities;Therapeutic exercise;Patient/family education    PT Goals (Current goals can be found in the Care Plan section)  Acute Rehab PT Goals Patient Stated Goal: Regain IND PT Goal Formulation: With patient Time For Goal Achievement:  10/29/16 Potential to Achieve Goals: Good    Frequency 7X/week   Barriers to discharge        Co-evaluation               End of Session Equipment Utilized During Treatment: Gait belt Activity Tolerance: Patient tolerated treatment well Patient left: in chair;with call bell/phone within reach;with chair alarm set Nurse Communication: Mobility status         Time: 1100-1142 PT Time Calculation (min) (ACUTE ONLY): 42 min   Charges:   PT Evaluation $PT Eval Low Complexity: 1 Procedure PT Treatments $Gait Training: 8-22 mins $Therapeutic Exercise: 8-22 mins   PT G Codes:        Keary Waterson 11/22/16, 1:35 PM

## 2016-10-27 NOTE — Progress Notes (Signed)
Subjective: 1 Day Post-Op Procedure(s) (LRB): LEFT TOTAL HIP ARTHROPLASTY ANTERIOR APPROACH (Left) Patient reports pain as mild. Doing okay this morning. Patient seen in rounds with Dr. Wynelle Link. Patient is well, but has had some minor complaints of pain in the hip, requiring pain medications We will start therapy today.  If they do well with therapy and meets all goals, then will allow home later this afternoon following therapy. Plan is to go Home after hospital stay.  Objective: Vital signs in last 24 hours: Temp:  [97.6 F (36.4 C)-99.3 F (37.4 C)] 98.4 F (36.9 C) (11/02 [id number]) Pulse Rate:  [51-78] 78 (11/02 0632) Resp:  [13-20] 19 (11/02 [id number]) BP: (106-160)/(63-96) 106/63 (11/02 0632) SpO2:  [90 %-100 %] 90 % (11/02 [id number]) Weight:  [100.2 kg (221 lb)] 100.2 kg (221 lb) (11/01 1440)  Intake/Output from previous day:  Intake/Output Summary (Last 24 hours) at 10/27/16 0739 Last data filed at 10/27/16 0600  Gross per 24 hour  Intake             2795 ml  Output             1350 ml  Net             1445 ml    Intake/Output this shift: No intake/output data recorded.  Labs:  Recent Labs  10/27/16 0407  HGB 13.7    Recent Labs  10/27/16 0407  WBC 11.4*  RBC 4.63  HCT 39.5  PLT 210    Recent Labs  10/27/16 0407  NA 135  K 4.3  CL 104  CO2 24  BUN 22*  CREATININE 1.22  GLUCOSE 161*  CALCIUM 8.7*   No results for input(s): LABPT, INR in the last 72 hours.  EXAM General - Patient is Alert, Appropriate and Oriented Extremity - Neurovascular intact Sensation intact distally Dorsiflexion/Plantar flexion intact Dressing - dressing C/D/I Motor Function - intact, moving foot and toes well on exam.  Hemovac pulled without difficulty.  Past Medical History:  Diagnosis Date  . Arthritis   . CAD (coronary artery disease)    a. 02/2014 Abnl MV with defect in LCX territory;  b. 02/2014 Cath: LAD 60-70p, 52m, LCX 100 CTO, OM1 95p, 99d, RCA 100 CTO w/ L->R  collaterals; c. 02/2014 CABG x 4 (LIMA->LAD, VG->OM, VG->RPDA->RPL); d. 01/2016 MV: 41%, med size, sev intensity fixed basal inferior lateral defect w/o ischemia.  . Carotid arterial disease ([place])    a. 06/2013 Carotid U/S: moderate narrowing of both subclavian arteries, with normal carotid arteries;  b. 02/2014 Carotid U/S: 1-39% bilat ICA stenosis.  Marland Kitchen History of echocardiogram    a. 02/2016 Echo: EF 50-55%, no rwma, mildly dil LA, nl RV fxn, mildly dil RA.  Marland Kitchen Hyperlipidemia   . Hypertensive heart disease   . Syndrome X, cardiac ([place])    a. Previously felt to have microvascular angina following remote cath in [place]-->treated with nitrates, ranexa, EECP.  Later found to have multivessel CAD.    Assessment/Plan: 1 Day Post-Op Procedure(s) (LRB): LEFT TOTAL HIP ARTHROPLASTY ANTERIOR APPROACH (Left) Principal Problem:   OA (osteoarthritis) of hip  Estimated body mass index is 32.64 kg/m as calculated from the following:   Height as of this encounter: 5\' 9"  (1.753 m).   Weight as of this encounter: 100.2 kg (221 lb). Up with therapy Discharge home with home health  DVT Prophylaxis - Xarelto Weight Bearing As Tolerated left Leg Hemovac Pulled Begin Therapy  If meets goals and able to  go home: Discharge home with home health Diet - Cardiac diet Follow up - in 2 weeks Activity - WBAT Disposition - Home Condition Upon Discharge - pending therapy D/C Meds - See DC Summary DVT Prophylaxis - Xarelto  Arlee Muslim, PA-C Orthopaedic Surgery 10/27/2016, 7:39 AM

## 2016-10-27 NOTE — Care Management Note (Signed)
Case Management Note  Patient Details  Name: Kenneth [name] MRN: [id number] Date of Birth: 04/21/1966  Subjective/Objective:   LEFT TOTAL HIP ARTHROPLASTY ANTERIOR APPROACH (Left)                Action/Plan: Discharge planning Expected Discharge Date: 10/27/16              Expected Discharge Plan:  [place]  In-House Referral:     Discharge planning Services  CM Consult  Post Acute Care Choice:  Home Health Choice offered to:  Patient  DME Arranged:  Walker rolling DME Agency:  [place]:  PT [place] Agency:  [facility] ([facility]  Status of Service:  Completed, signed off  If discussed at [employer] of Stay Meetings, dates discussed:    Additional Comments: CM met with pt in room to offer choice of home health agency.  Pt chooses [facility].  Referral given to [facility] rep, Tim.  CM notified [facility] DME rep, Jermaine to please deliver the rolling walker to room prior to discharge.  No other CM needs were communicated. Dellie Catholic, RN 10/27/2016, 9:47 AM

## 2016-10-27 NOTE — Evaluation (Signed)
Occupational Therapy Evaluation Patient Details Name: Kenneth [name] MRN: [id number] DOB: 08-19-1966 Today's Date: 10/27/2016    History of Present Illness Pt s/p L THR  and with hx of CAD and CABG   Clinical Impression   OT education regarding ADL activity s/p THA complete.   Follow Up Recommendations  No OT follow up    Equipment Recommendations  None recommended by OT       Precautions / Restrictions Precautions Precautions: Fall Restrictions Weight Bearing Restrictions: No LLE Weight Bearing: Weight bearing as tolerated      Mobility Bed Mobility Overal bed mobility: Needs Assistance Bed Mobility: Supine to Sit     Supine to sit: Min assist     General bed mobility comments: pt in chair  Transfers Overall transfer level: Needs assistance Equipment used: Rolling walker (2 wheeled) Transfers: Stand Pivot Transfers;Sit to/from Stand Sit to Stand: Min guard Stand pivot transfers: Min guard       General transfer comment: cues for LE management and use of UEs to self assist         ADL Overall ADL's : Needs assistance/impaired     Grooming: Set up;Standing;Cueing for safety   Upper Body Bathing: Set up;Sitting   Lower Body Bathing: Minimal assistance;Sit to/from stand;Cueing for safety;Cueing for sequencing   Upper Body Dressing : Set up;Sitting   Lower Body Dressing: Minimal assistance;Sit to/from stand;Cueing for safety;Cueing for sequencing;Cueing for compensatory techniques   Toilet Transfer: RW;Ambulation;Cueing for sequencing;Cueing for safety;Min guard   Toileting- Clothing Manipulation and Hygiene: Supervision/safety;Sit to/from stand;Cueing for safety;Cueing for sequencing;Cueing for compensatory techniques     Tub/Shower Transfer Details (indicate cue type and reason): verbalized safety with shower transfer Functional mobility during ADLs: Min guard General ADL Comments: wife will A as needed               Pertinent  Vitals/Pain Pain Assessment: 0-10 Pain Score: 2  Pain Location: l hip Pain Descriptors / Indicators: Sore Pain Intervention(s): Monitored during session     Hand Dominance        Communication Communication Communication: No difficulties   Cognition Arousal/Alertness: Awake/alert Behavior During Therapy: WFL for tasks assessed/performed Overall Cognitive Status: Within Functional Limits for tasks assessed                                Home Living Family/patient expects to be discharged to:: Private residence Living Arrangements: Spouse/significant other Available Help at Discharge: Family Type of Home: House Home Access: Stairs to enter Technical brewer of Steps: 2 Entrance Stairs-Rails: Left;Right Home Layout: Two level Alternate Level Stairs-Number of Steps: 14 Alternate Level Stairs-Rails: Right;Left;Can reach both           Home Equipment: Cane - single point          Prior Functioning/Environment Level of Independence: Independent                       OT Goals(Current goals can be found in the care plan section) Acute Rehab OT Goals Patient Stated Goal: Regain IND  OT Frequency:                End of Session Equipment Utilized During Treatment: Rolling walker Nurse Communication: Mobility status  Activity Tolerance: Patient tolerated treatment well Patient left: Other (comment) (in bathroom with wife present)   Time: [id number] OT Time Calculation (min): 19 min Charges:  OT General Charges $  OT Visit: 1 Procedure OT Evaluation $OT Eval Moderate Complexity: 1 Procedure G-Codes:    Betsy Pries Nov 20, 2016, 1:49 PM

## 2016-10-27 NOTE — Discharge Instructions (Signed)
° °Dr. Frank [name] °Total Joint Specialist °[facility] °[address] °[place], [place] [address] °[phone] ° °ANTERIOR APPROACH TOTAL HIP REPLACEMENT POSTOPERATIVE DIRECTIONS ° ° °Hip Rehabilitation, Guidelines Following Surgery  °The results of a hip operation are greatly improved after range of motion and muscle strengthening exercises. Follow all safety measures which are given to protect your hip. If any of these exercises cause increased pain or swelling in your joint, decrease the amount until you are comfortable again. Then slowly increase the exercises. Call your caregiver if you have problems or questions.  ° °HOME CARE INSTRUCTIONS  °Remove items at home which could result in a fall. This includes throw rugs or furniture in walking pathways.  °· ICE to the affected hip every three hours for 30 minutes at a time and then as needed for pain and swelling.  Continue to use ice on the hip for pain and swelling from surgery. You may notice swelling that will progress down to the foot and ankle.  This is normal after surgery.  Elevate the leg when you are not up walking on it.   °· Continue to use the breathing machine which will help keep your temperature down.  It is common for your temperature to cycle up and down following surgery, especially at night when you are not up moving around and exerting yourself.  The breathing machine keeps your lungs expanded and your temperature down. ° ° °DIET °You may resume your previous home diet once your are discharged from the hospital. ° °DRESSING / WOUND CARE / SHOWERING °You may shower 3 days after surgery, but keep the wounds dry during showering.  You may use an occlusive plastic wrap (Press'n Seal for example), NO SOAKING/SUBMERGING IN THE BATHTUB.  If the bandage gets wet, change with a clean dry gauze.  If the incision gets wet, pat the wound dry with a clean towel. °You may start showering once you are discharged home but do not  submerge the incision under water. Just pat the incision dry and apply a dry gauze dressing on daily. °Change the surgical dressing daily and reapply a dry dressing each time. ° °ACTIVITY °Walk with your walker as instructed. °Use walker as long as suggested by your caregivers. °Avoid periods of inactivity such as sitting longer than an hour when not asleep. This helps prevent blood clots.  °You may resume a sexual relationship in one month or when given the OK by your doctor.  °You may return to work once you are cleared by your doctor.  °Do not drive a car for 6 weeks or until released by you surgeon.  °Do not drive while taking narcotics. ° °WEIGHT BEARING °Weight bearing as tolerated with assist device (walker, cane, etc) as directed, use it as long as suggested by your surgeon or therapist, typically at least 4-6 weeks. ° °POSTOPERATIVE CONSTIPATION PROTOCOL °Constipation - defined medically as fewer than three stools per week and severe constipation as less than one stool per week. ° °One of the most common issues patients have following surgery is constipation.  Even if you have a regular bowel pattern at home, your normal regimen is likely to be disrupted due to multiple reasons following surgery.  Combination of anesthesia, postoperative narcotics, change in appetite and fluid intake all can affect your bowels.  In order to avoid complications following surgery, here are some recommendations in order to help you during your recovery period. ° °Colace (docusate) - Pick up an over-the-counter   form of Colace or another stool softener and take twice a day as long as you are requiring postoperative pain medications.  Take with a full glass of water daily.  If you experience loose stools or diarrhea, hold the colace until you stool forms back up.  If your symptoms do not get better within 1 week or if they get worse, check with your doctor. ° °Dulcolax (bisacodyl) - Pick up over-the-counter and take as directed  by the product packaging as needed to assist with the movement of your bowels.  Take with a full glass of water.  Use this product as needed if not relieved by Colace only.  ° °MiraLax (polyethylene glycol) - Pick up over-the-counter to have on hand.  MiraLax is a solution that will increase the amount of water in your bowels to assist with bowel movements.  Take as directed and can mix with a glass of water, juice, soda, coffee, or tea.  Take if you go more than two days without a movement. °Do not use MiraLax more than once per day. Call your doctor if you are still constipated or irregular after using this medication for 7 days in a row. ° °If you continue to have problems with postoperative constipation, please contact the office for further assistance and recommendations.  If you experience "the worst abdominal pain ever" or develop nausea or vomiting, please contact the office immediatly for further recommendations for treatment. ° °ITCHING ° If you experience itching with your medications, try taking only a single pain pill, or even half a pain pill at a time.  You can also use Benadryl over the counter for itching or also to help with sleep.  ° °TED HOSE STOCKINGS °Wear the elastic stockings on both legs for three weeks following surgery during the day but you may remove then at night for sleeping. ° °MEDICATIONS °See your medication summary on the “After Visit Summary” that the nursing staff will review with you prior to discharge.  You may have some home medications which will be placed on hold until you complete the course of blood thinner medication.  It is important for you to complete the blood thinner medication as prescribed by your surgeon.  Continue your approved medications as instructed at time of discharge. ° °PRECAUTIONS °If you experience chest pain or shortness of breath - call 911 immediately for transfer to the hospital emergency department.  °If you develop a fever greater that 101 F,  purulent drainage from wound, increased redness or drainage from wound, foul odor from the wound/dressing, or calf pain - CONTACT YOUR SURGEON.   °                                                °FOLLOW-UP APPOINTMENTS °Make sure you keep all of your appointments after your operation with your surgeon and caregivers. You should call the office at the above phone number and make an appointment for approximately two weeks after the date of your surgery or on the date instructed by your surgeon outlined in the "After Visit Summary". ° °RANGE OF MOTION AND STRENGTHENING EXERCISES  °These exercises are designed to help you keep full movement of your hip joint. Follow your caregiver's or physical therapist's instructions. Perform all exercises about fifteen times, three times per day or as directed. Exercise both hips, even if you   have had only one joint replacement. These exercises can be done on a training (exercise) mat, on the floor, on a table or on a bed. Use whatever works the best and is most comfortable for you. Use music or television while you are exercising so that the exercises are a pleasant break in your day. This will make your life better with the exercises acting as a break in routine you can look forward to.  Lying on your back, slowly slide your foot toward your buttocks, raising your knee up off the floor. Then slowly slide your foot back down until your leg is straight again.  Lying on your back spread your legs as far apart as you can without causing discomfort.  Lying on your side, raise your upper leg and foot straight up from the floor as far as is comfortable. Slowly lower the leg and repeat.  Lying on your back, tighten up the muscle in the front of your thigh (quadriceps muscles). You can do this by keeping your leg straight and trying to raise your heel off the floor. This helps strengthen the largest muscle supporting your knee.  Lying on your back, tighten up the muscles of your  buttocks both with the legs straight and with the knee bent at a comfortable angle while keeping your heel on the floor.   IF YOU ARE TRANSFERRED TO A SKILLED REHAB FACILITY If the patient is transferred to a skilled rehab facility following release from the hospital, a list of the current medications will be sent to the facility for the patient to continue.  When discharged from the skilled rehab facility, please have the facility set up the patient's [place] prior to being released. Also, the skilled facility will be responsible for providing the patient with their medications at time of release from the facility to include their pain medication, the muscle relaxants, and their blood thinner medication. If the patient is still at the rehab facility at time of the two week follow up appointment, the skilled rehab facility will also need to assist the patient in arranging follow up appointment in our office and any transportation needs.  MAKE SURE YOU:  Understand these instructions.  Get help right away if you are not doing well or get worse.    Pick up stool softner and laxative for home use following surgery while on pain medications. Do not submerge incision under water. Please use good hand washing techniques while changing dressing each day. May shower starting three days after surgery. Please use a clean towel to pat the incision dry following showers. Continue to use ice for pain and swelling after surgery. Do not use any lotions or creams on the incision until instructed by your surgeon.  Take Xarelto for two and a half more weeks, then discontinue Xarelto. Once the patient has completed the Xarelto, they may resume the 325 mg Aspirin.

## 2016-10-27 NOTE — Progress Notes (Signed)
Physical Therapy Treatment Patient Details Name: Kenneth [name] MRN: [id number] DOB: 1966/12/18 Today's Date: 10/27/2016    History of Present Illness Pt s/p L THR  and with hx of CAD and CABG    PT Comments    Pt requesting no HHPT.  Pt very motivated and with good body awareness with long hx in martial arts.  Home therex provided and reviewed with pt and spouse.  Follow Up Recommendations  No PT follow up     Equipment Recommendations  Rolling walker with 5" wheels    Recommendations for Other Services OT consult     Precautions / Restrictions Precautions Precautions: Fall Restrictions Weight Bearing Restrictions: No LLE Weight Bearing: Weight bearing as tolerated    Mobility  Bed Mobility Overal bed mobility: Needs Assistance Bed Mobility: Supine to Sit     Supine to sit: Supervision Sit to supine: Min guard   General bed mobility comments: cues for sequence and use of R LE to self assist  Transfers Overall transfer level: Needs assistance Equipment used: Rolling walker (2 wheeled) Transfers: Sit to/from Stand Sit to Stand: Supervision Stand pivot transfers: Supervision       General transfer comment: min cues for LE management and use of UEs to self assist  Ambulation/Gait Ambulation/Gait assistance: Min guard;Supervision Ambulation Distance (Feet): 100 Feet Assistive device: Rolling walker (2 wheeled) Gait Pattern/deviations: Step-to pattern;Step-through pattern;Decreased step length - right;Decreased step length - left;Shuffle;Trunk flexed Gait velocity: decr Gait velocity interpretation: Below normal speed for age/gender General Gait Details: cues for sequence, posture and position from RW   Stairs Stairs: Yes Stairs assistance: Min assist Stair Management: One rail Right;Step to pattern;Two rails;Forwards;With cane Number of Stairs: 5 General stair comments: 2 stairs with bil rails and 3 stairs with rail and cane; cues for sequence and  foot/cane placement.  Spouse present and assisting  Wheelchair Mobility    Modified Rankin (Stroke Patients Only)       Balance                                    Cognition Arousal/Alertness: Awake/alert Behavior During Therapy: WFL for tasks assessed/performed Overall Cognitive Status: Within Functional Limits for tasks assessed                      Exercises Total Joint Exercises Ankle Circles/Pumps: AROM;Both;10 reps;Supine Quad Sets: AROM;Both;10 reps;Supine Heel Slides: AAROM;Left;Supine;10 reps Hip ABduction/ADduction: AAROM;Left;Supine;10 reps Long Arc Quad: AAROM;AROM;Left;10 reps;Seated    General Comments        Pertinent Vitals/Pain Pain Assessment: 0-10 Pain Score: 3  Pain Location: L hip Pain Descriptors / Indicators: Aching;Sore Pain Intervention(s): Limited activity within patient's tolerance;Monitored during session;Premedicated before session;Ice applied    Home Living Family/patient expects to be discharged to:: Private residence Living Arrangements: Spouse/significant other Available Help at Discharge: Family Type of Home: House Home Access: Stairs to enter Entrance Stairs-Rails: Left;Right Home Layout: Two level [place]: [place] - single point      Prior Function Level of Independence: Independent          PT Goals (current goals can now be found in the care plan section) Acute Rehab PT Goals Patient Stated Goal: Regain IND PT Goal Formulation: With patient Time For Goal Achievement: 10/29/16 Potential to Achieve Goals: Good Progress towards PT goals: Progressing toward goals    Frequency    7X/week      PT  Plan Discharge plan needs to be updated    Co-evaluation             End of Session Equipment Utilized During Treatment: Gait belt Activity Tolerance: Patient tolerated treatment well Patient left: in chair;with call bell/phone within reach;with family/visitor present     Time:  1444-1500 PT Time Calculation (min) (ACUTE ONLY): 16 min  Charges:  $Gait Training: 8-22 mins $Therapeutic Exercise: 8-22 mins $Therapeutic Activity: 8-22 mins                    G Codes:      Kenneth [name] 10/30/16, 5:12 PM

## 2016-10-27 NOTE — Progress Notes (Signed)
Pt's vitals are WNL, tolerating diet and pain is under control. Discussed discharge instructions with both patient and husband. Discharged to home with prescriptions, walker and home exercise instructions.

## 2016-10-27 NOTE — Discharge Summary (Signed)
Physician Discharge Summary   Patient ID: Kenneth [name] MRN: [id number] DOB/AGE: [birth year] 50 y.o.  Admit date: 10/26/2016 Discharge date: 10-27-2016  Primary Diagnosis:  Osteoarthritis of the Left hip.   Admission Diagnoses:  Past Medical History:  Diagnosis Date  . Arthritis   . CAD (coronary artery disease)    a. 02/2014 Abnl MV with defect in LCX territory;  b. 02/2014 Cath: LAD 60-70p, 77m LCX 100 CTO, OM1 95p, 99d, RCA 100 CTO w/ L->R collaterals; c. 02/2014 CABG x 4 (LIMA->LAD, VG->OM, VG->RPDA->RPL); d. 01/2016 MV: 41%, med size, sev intensity fixed basal inferior lateral defect w/o ischemia.  . Carotid arterial disease ([place]    a. 06/2013 Carotid U/S: moderate narrowing of both subclavian arteries, with normal carotid arteries;  b. 02/2014 Carotid U/S: 1-39% bilat ICA stenosis.  .Marland KitchenHistory of echocardiogram    a. 02/2016 Echo: EF 50-55%, no rwma, mildly dil LA, nl RV fxn, mildly dil RA.  .Marland KitchenHyperlipidemia   . Hypertensive heart disease   . Syndrome X, cardiac ([place]    a. Previously felt to have microvascular angina following remote cath in [place]-->treated with nitrates, ranexa, EECP.  Later found to have multivessel CAD.   Discharge Diagnoses:   Principal Problem:   OA (osteoarthritis) of hip  Estimated body mass index is 32.64 kg/m as calculated from the following:   Height as of this encounter: 5' 9"  (1.753 m).   Weight as of this encounter: 100.2 kg (221 lb).  Procedure(s) (LRB): LEFT TOTAL HIP ARTHROPLASTY ANTERIOR APPROACH (Left)   Consults: None  HPI: Kenneth [name] a 50y.o. male who has advanced end-  stage arthritis of their Left  hip with progressively worsening pain and  dysfunction.The patient has failed nonoperative management and presents for  total hip arthroplasty.   Laboratory Data: Admission on 10/26/2016  Component Date Value Ref Range Status  . WBC 10/27/2016 11.4* 4.0 - 10.5 K/uL Final  . RBC 10/27/2016 4.63  4.22 - 5.81 MIL/uL Final  .  Hemoglobin 10/27/2016 13.7  13.0 - 17.0 g/dL Final  . HCT 10/27/2016 39.5  39.0 - 52.0 % Final  . MCV 10/27/2016 85.3  78.0 - 100.0 fL Final  . MCH 10/27/2016 29.6  26.0 - 34.0 pg Final  . MCHC 10/27/2016 34.7  30.0 - 36.0 g/dL Final  . RDW 10/27/2016 13.1  11.5 - 15.5 % Final  . Platelets 10/27/2016 210  150 - 400 K/uL Final  . Sodium 10/27/2016 135  135 - 145 mmol/L Final  . Potassium 10/27/2016 4.3  3.5 - 5.1 mmol/L Final  . Chloride 10/27/2016 104  101 - 111 mmol/L Final  . CO2 10/27/2016 24  22 - 32 mmol/L Final  . Glucose, Bld 10/27/2016 161* 65 - 99 mg/dL Final  . BUN 10/27/2016 22* 6 - 20 mg/dL Final  . Creatinine, Ser 10/27/2016 1.22  0.61 - 1.24 mg/dL Final  . Calcium 10/27/2016 8.7* 8.9 - 10.3 mg/dL Final  . GFR calc non Af Amer 10/27/2016 >60  >60 mL/min Final  . GFR calc Af Amer 10/27/2016 >60  >60 mL/min Final   Comment: (NOTE) The eGFR has been calculated using the CKD EPI equation. This calculation has not been validated in all clinical situations. eGFR's persistently <60 mL/min signify possible Chronic Kidney Disease.   .Georgiann Hahngap 10/27/2016 7  5 - 15 Final  Hospital Outpatient Visit on 10/17/2016  Component Date Value Ref Range Status  . aPTT 10/17/2016 30  24 - 36 seconds  Final  . WBC 10/17/2016 4.2  4.0 - 10.5 K/uL Final  . RBC 10/17/2016 5.15  4.22 - 5.81 MIL/uL Final  . Hemoglobin 10/17/2016 15.3  13.0 - 17.0 g/dL Final  . HCT 10/17/2016 44.1  39.0 - 52.0 % Final  . MCV 10/17/2016 85.6  78.0 - 100.0 fL Final  . MCH 10/17/2016 29.7  26.0 - 34.0 pg Final  . MCHC 10/17/2016 34.7  30.0 - 36.0 g/dL Final  . RDW 10/17/2016 12.8  11.5 - 15.5 % Final  . Platelets 10/17/2016 281  150 - 400 K/uL Final  . Sodium 10/17/2016 137  135 - 145 mmol/L Final  . Potassium 10/17/2016 4.2  3.5 - 5.1 mmol/L Final  . Chloride 10/17/2016 105  101 - 111 mmol/L Final  . CO2 10/17/2016 25  22 - 32 mmol/L Final  . Glucose, Bld 10/17/2016 89  65 - 99 mg/dL Final  . BUN 10/17/2016  27* 6 - 20 mg/dL Final  . Creatinine, Ser 10/17/2016 1.16  0.61 - 1.24 mg/dL Final  . Calcium 10/17/2016 9.7  8.9 - 10.3 mg/dL Final  . Total Protein 10/17/2016 7.9  6.5 - 8.1 g/dL Final  . Albumin 10/17/2016 4.5  3.5 - 5.0 g/dL Final  . AST 10/17/2016 27  15 - 41 U/L Final  . ALT 10/17/2016 24  17 - 63 U/L Final  . Alkaline Phosphatase 10/17/2016 46  38 - 126 U/L Final  . Total Bilirubin 10/17/2016 0.9  0.3 - 1.2 mg/dL Final  . GFR calc non Af Amer 10/17/2016 >60  >60 mL/min Final  . GFR calc Af Amer 10/17/2016 >60  >60 mL/min Final   Comment: (NOTE) The eGFR has been calculated using the CKD EPI equation. This calculation has not been validated in all clinical situations. eGFR's persistently <60 mL/min signify possible Chronic Kidney Disease.   . Anion gap 10/17/2016 7  5 - 15 Final  . Prothrombin Time 10/17/2016 13.3  11.4 - 15.2 seconds Final  . INR 10/17/2016 1.01   Final  . ABO/RH(D) 10/26/2016 A POS   Final  . Antibody Screen 10/26/2016 NEG   Final  . Sample Expiration 10/26/2016 10/29/2016   Final  . Extend sample reason 10/26/2016 NO TRANSFUSIONS OR PREGNANCY IN THE PAST 3 MONTHS   Final  . Color, Urine 10/17/2016 YELLOW  YELLOW Final  . APPearance 10/17/2016 CLEAR  CLEAR Final  . Specific Gravity, Urine 10/17/2016 1.026  1.005 - 1.030 Final  . pH 10/17/2016 5.5  5.0 - 8.0 Final  . Glucose, UA 10/17/2016 NEGATIVE  NEGATIVE mg/dL Final  . Hgb urine dipstick 10/17/2016 NEGATIVE  NEGATIVE Final  . Bilirubin Urine 10/17/2016 NEGATIVE  NEGATIVE Final  . Ketones, ur 10/17/2016 NEGATIVE  NEGATIVE mg/dL Final  . Protein, ur 10/17/2016 30* NEGATIVE mg/dL Final  . Nitrite 10/17/2016 NEGATIVE  NEGATIVE Final  . Leukocytes, UA 10/17/2016 NEGATIVE  NEGATIVE Final  . MRSA, PCR 10/18/2016 NEGATIVE  NEGATIVE Final  . Staphylococcus aureus 10/18/2016 POSITIVE* NEGATIVE Final   Comment:        The Xpert SA Assay (FDA approved for NASAL specimens in patients over 45 years of age), is  one component of a comprehensive surveillance program.  Test performance has been validated by [facility] for patients greater than or equal to 21 year old. It is not intended to diagnose infection nor to guide or monitor treatment.   . Squamous Epithelial / LPF 10/17/2016 NONE SEEN  NONE SEEN Final  . WBC, UA  10/17/2016 NONE SEEN  0 - 5 WBC/hpf Final  . RBC / HPF 10/17/2016 0-5  0 - 5 RBC/hpf Final  . Bacteria, UA 10/17/2016 NONE SEEN  NONE SEEN Final  . ABO/RH(D) 10/17/2016 A POS   Final     X-Rays:Dg Pelvis Portable  Result Date: 10/26/2016 CLINICAL DATA:  Postoperative radiograph, status post left hip surgery. Initial encounter. EXAM: PORTABLE PELVIS 1-2 VIEWS COMPARISON:  None. FINDINGS: The patient is status post placement of a left hip arthroplasty, which appears grossly intact, without evidence of loosening or new fracture. Overlying postoperative soft tissue air is seen. The right hip joint is grossly unremarkable, aside from mild sclerosis at the weight-bearing surface of the right acetabulum. The sacroiliac joints are grossly unremarkable. The visualized bowel gas pattern is within normal limits. IMPRESSION: Status post left hip arthroplasty, without evidence of loosening or new fracture. Electronically Signed   By: Garald Balding M.D.   On: 10/26/2016 19:09   Dg C-arm 1-60 Min-no Report  Result Date: 10/26/2016 CLINICAL DATA: surgery C-ARM 1-60 MINUTES Fluoroscopy was utilized by the requesting physician.  No radiographic interpretation.    EKG: Orders placed or performed in visit on 10/06/16  . EKG 12-Lead     Hospital Course: Patient was admitted to [facility] and taken to the OR and underwent the above state procedure without complications.  Patient tolerated the procedure well and was later transferred to the recovery room and then to the orthopaedic floor for postoperative care.  They were given PO and IV analgesics for pain control following their surgery.   They were given 24 hours of postoperative antibiotics of  Anti-infectives    Start     Dose/Rate Route Frequency Ordered Stop   10/26/16 2230  ceFAZolin (ANCEF) IVPB 2g/100 mL premix     2 g 200 mL/hr over 30 Minutes Intravenous Every 6 hours 10/26/16 2002 10/27/16 0434   10/26/16 1430  ceFAZolin (ANCEF) IVPB 2g/100 mL premix     2 g 200 mL/hr over 30 Minutes Intravenous On call to O.R. 10/26/16 1422 10/26/16 1700     and started on DVT prophylaxis in the form of Xarelto.   PT and OT were ordered for total hip protocol.  The patient was allowed to be WBAT with therapy. Discharge planning was consulted to help with postop disposition and equipment needs.  Patient had a decent night on the evening of surgery.  They started to get up OOB with therapy on day one.  Hemovac drain was pulled without difficulty. Dressing was checked and was clean and dry.  Patient was seen in rounds on POD 1 by Dr. Wynelle Link setup for discharge for later that same day. He worked with therapy and was ready to go home.  Discharge home with home health Diet - Cardiac diet Follow up - in 2 weeks Activity - WBAT Disposition - Home Condition Upon Discharge - good D/C Meds - See DC Summary DVT Prophylaxis - Xarelto  Discharge Instructions    Call MD / Call 911    Complete by:  As directed    If you experience chest pain or shortness of breath, CALL 911 and be transported to the hospital emergency room.  If you develope a fever above 101 F, pus (white drainage) or increased drainage or redness at the wound, or calf pain, call your surgeon's office.   Change dressing    Complete by:  As directed    You may change your dressing dressing daily  with sterile 4 x 4 inch gauze dressing and paper tape.  Do not submerge the incision under water.   Constipation Prevention    Complete by:  As directed    Drink plenty of fluids.  Prune juice may be helpful.  You may use a stool softener, such as Colace (over the counter) 100 mg  twice a day.  Use MiraLax (over the counter) for constipation as needed.   Diet - low sodium heart healthy    Complete by:  As directed    Discharge instructions    Complete by:  As directed    Pick up stool softner and laxative for home use following surgery while on pain medications. Do not submerge incision under water. Please use good hand washing techniques while changing dressing each day. May shower starting three days after surgery. Please use a clean towel to pat the incision dry following showers. Continue to use ice for pain and swelling after surgery. Do not use any lotions or creams on the incision until instructed by your surgeon.   Postoperative Constipation Protocol  Constipation - defined medically as fewer than three stools per week and severe constipation as less than one stool per week.  One of the most common issues patients have following surgery is constipation.  Even if you have a regular bowel pattern at home, your normal regimen is likely to be disrupted due to multiple reasons following surgery.  Combination of anesthesia, postoperative narcotics, change in appetite and fluid intake all can affect your bowels.  In order to avoid complications following surgery, here are some recommendations in order to help you during your recovery period.  Colace (docusate) - Pick up an over-the-counter form of Colace or another stool softener and take twice a day as long as you are requiring postoperative pain medications.  Take with a full glass of water daily.  If you experience loose stools or diarrhea, hold the colace until you stool forms back up.  If your symptoms do not get better within 1 week or if they get worse, check with your doctor.  Dulcolax (bisacodyl) - Pick up over-the-counter and take as directed by the product packaging as needed to assist with the movement of your bowels.  Take with a full glass of water.  Use this product as needed if not relieved by Colace  only.   MiraLax (polyethylene glycol) - Pick up over-the-counter to have on hand.  MiraLax is a solution that will increase the amount of water in your bowels to assist with bowel movements.  Take as directed and can mix with a glass of water, juice, soda, coffee, or tea.  Take if you go more than two days without a movement. Do not use MiraLax more than once per day. Call your doctor if you are still constipated or irregular after using this medication for 7 days in a row.  If you continue to have problems with postoperative constipation, please contact the office for further assistance and recommendations.  If you experience "the worst abdominal pain ever" or develop nausea or vomiting, please contact the office immediatly for further recommendations for treatment.   Take Xarelto for two and a half more weeks, then discontinue Xarelto. Once the patient has completed the Xarelto, they may resume the 325 mg Aspirin.   Do not sit on low chairs, stoools or toilet seats, as it may be difficult to get up from low surfaces    Complete by:  As directed  Driving restrictions    Complete by:  As directed    No driving until released by the physician.   Increase activity slowly as tolerated    Complete by:  As directed    Lifting restrictions    Complete by:  As directed    No lifting until released by the physician.   Patient may shower    Complete by:  As directed    You may shower without a dressing once there is no drainage.  Do not wash over the wound.  If drainage remains, do not shower until drainage stops.   TED hose    Complete by:  As directed    Use stockings (TED hose) for 3 weeks on both leg(s).  You may remove them at night for sleeping.   Weight bearing as tolerated    Complete by:  As directed    Laterality:  left   Extremity:  Lower       Medication List    STOP taking these medications   aspirin EC 325 MG tablet   meloxicam 15 MG tablet Commonly known as:  MOBIC     TESTOSTERONE IM     TAKE these medications   atorvastatin 80 MG tablet Commonly known as:  LIPITOR TAKE 1 TABLET (80 MG TOTAL) BY MOUTH AT BEDTIME.   ezetimibe 10 MG tablet Commonly known as:  ZETIA TAKE 1 TABLET EVERY DAY   gemfibrozil 600 MG tablet Commonly known as:  LOPID TAKE 1 TABLET (600 MG TOTAL) BY MOUTH 2 (TWO) TIMES DAILY.   lisinopril 10 MG tablet Commonly known as:  PRINIVIL,ZESTRIL TAKE 1 TABLET (10 MG TOTAL) BY MOUTH DAILY. PLEASE CONTACT OFFICE FOR ADDITIONAL REFILLS 2ND ATTEMPT   loperamide 2 MG capsule Commonly known as:  IMODIUM Take 2 mg by mouth as needed for diarrhea or loose stools.   methocarbamol 500 MG tablet Commonly known as:  ROBAXIN Take 1 tablet (500 mg total) by mouth every 6 (six) hours as needed for muscle spasms.   metoprolol tartrate 25 MG tablet Commonly known as:  LOPRESSOR TAKE 1 TABLET BY MOUTH TWICE A DAY   oxyCODONE 5 MG immediate release tablet Commonly known as:  Oxy IR/ROXICODONE Take 1-2 tablets (5-10 mg total) by mouth every 3 (three) hours as needed for moderate pain or severe pain.   rivaroxaban 10 MG Tabs tablet Commonly known as:  XARELTO Take 1 tablet (10 mg total) by mouth daily with breakfast. Take Xarelto for two and a half more weeks, then discontinue Xarelto. Once the patient has completed the Xarelto, they may resume the 325 mg Aspirin.   traMADol 50 MG tablet Commonly known as:  ULTRAM Take 1-2 tablets (50-100 mg total) by mouth every 6 (six) hours as needed (mild pain). What changed:  when to take this  reasons to take this  additional instructions   VYVANSE 40 MG capsule Generic drug:  lisdexamfetamine Take 40 mg by mouth every morning.   zolpidem 10 MG tablet Commonly known as:  AMBIEN Take 10 mg by mouth at bedtime as needed. for sleep      Follow-up Information    Gearlean Alf, MD. Schedule an appointment as soon as possible for a visit on 11/08/2016.   Specialty:  Orthopedic  Surgery Contact information: [address] [place] [address] [phone]           Signed: Arlee Muslim, PA-C Orthopaedic Surgery 10/27/2016, 7:47 AM

## 2016-10-27 NOTE — Progress Notes (Signed)
Physical Therapy Treatment Patient Details Name: Kenneth [name] MRN: [id number] DOB: 07-01-1966 Today's Date: 10/27/2016    History of Present Illness Pt s/p L THR  and with hx of CAD and CABG    PT Comments    Pt very motivated and progressing well with mobility.   Reviewed stairs and car transfers with pt and spouse.  Follow Up Recommendations  Home health PT     Equipment Recommendations  Rolling walker with 5" wheels    Recommendations for Other Services OT consult     Precautions / Restrictions Precautions Precautions: Fall Restrictions Weight Bearing Restrictions: No LLE Weight Bearing: Weight bearing as tolerated    Mobility  Bed Mobility Overal bed mobility: Needs Assistance Bed Mobility: Supine to Sit;Sit to Supine     Supine to sit: Min guard Sit to supine: Min guard   General bed mobility comments: cues for sequence and use of R LE to self assist  Transfers Overall transfer level: Needs assistance Equipment used: Rolling walker (2 wheeled) Transfers: Sit to/from Stand Sit to Stand: Supervision Stand pivot transfers: Min guard       General transfer comment: min cues for LE management and use of UEs to self assist  Ambulation/Gait Ambulation/Gait assistance: Min guard;Supervision Ambulation Distance (Feet): 100 Feet Assistive device: Rolling walker (2 wheeled) Gait Pattern/deviations: Step-to pattern;Step-through pattern;Decreased step length - right;Decreased step length - left;Shuffle;Trunk flexed Gait velocity: decr Gait velocity interpretation: Below normal speed for age/gender General Gait Details: cues for sequence, posture and position from RW   Stairs Stairs: Yes Stairs assistance: Min assist Stair Management: One rail Right;Step to pattern;Two rails;Forwards;With cane Number of Stairs: 5 General stair comments: 2 stairs with bil rails and 3 stairs with rail and cane; cues for sequence and foot/cane placement.  Spouse present and  assisting  Wheelchair Mobility    Modified Rankin (Stroke Patients Only)       Balance                                    Cognition Arousal/Alertness: Awake/alert Behavior During Therapy: WFL for tasks assessed/performed Overall Cognitive Status: Within Functional Limits for tasks assessed                      Exercises      General Comments        Pertinent Vitals/Pain Pain Assessment: 0-10 Pain Score: 3  Pain Location: L hip Pain Descriptors / Indicators: Aching;Sore Pain Intervention(s): Limited activity within patient's tolerance;Monitored during session;Premedicated before session;Ice applied    Home Living Family/patient expects to be discharged to:: Private residence Living Arrangements: Spouse/significant other Available Help at Discharge: Family Type of Home: House Home Access: Stairs to enter Entrance Stairs-Rails: Left;Right Home Layout: Two level Home Equipment: [place] - single point      Prior Function Level of Independence: Independent          PT Goals (current goals can now be found in the care plan section) Acute Rehab PT Goals Patient Stated Goal: Regain IND PT Goal Formulation: With patient Time For Goal Achievement: 10/29/16 Potential to Achieve Goals: Good Progress towards PT goals: Progressing toward goals    Frequency    7X/week      PT Plan Current plan remains appropriate    Co-evaluation             End of Session Equipment Utilized During Treatment: Gait  belt Activity Tolerance: Patient tolerated treatment well Patient left: in bed;with call bell/phone within reach;with family/visitor present     Time: [id number] PT Time Calculation (min) (ACUTE ONLY): 27 min  Charges:  $Gait Training: 8-22 mins $Therapeutic Activity: 8-22 mins                    G Codes:      Kenneth [name] 11-08-2016, 5:05 PM

## 2016-11-01 ENCOUNTER — Other Ambulatory Visit: Payer: Self-pay | Admitting: Internal Medicine

## 2016-11-02 NOTE — Telephone Encounter (Signed)
Rx has been sent to the pharmacy electronically. ° °

## 2016-11-30 ENCOUNTER — Inpatient Hospital Stay (HOSPITAL_COMMUNITY): Admission: RE | Admit: 2016-11-30 | Payer: 59 | Source: Ambulatory Visit

## 2016-12-13 ENCOUNTER — Encounter (HOSPITAL_COMMUNITY): Payer: 59

## 2017-01-10 ENCOUNTER — Inpatient Hospital Stay (HOSPITAL_COMMUNITY): Admission: RE | Admit: 2017-01-10 | Payer: 59 | Source: Ambulatory Visit

## 2017-01-19 ENCOUNTER — Ambulatory Visit (HOSPITAL_COMMUNITY)
Admission: RE | Admit: 2017-01-19 | Discharge: 2017-01-19 | Disposition: A | Discharge status: Home / Self Care | Payer: 59 | Source: Ambulatory Visit | Attending: Nurse Practitioner | Admitting: Nurse Practitioner

## 2017-01-19 DIAGNOSIS — R0989 Other specified symptoms and signs involving the circulatory and respiratory systems: Secondary | ICD-10-CM

## 2017-01-19 DIAGNOSIS — I6523 Occlusion and stenosis of bilateral carotid arteries: Secondary | ICD-10-CM | POA: Diagnosis not present

## 2017-05-05 ENCOUNTER — Telehealth: Payer: Self-pay | Admitting: Internal Medicine

## 2017-05-05 MED ORDER — LISINOPRIL 20 MG PO TABS: 20.0000 mg | ORAL_TABLET | Freq: Every day | ORAL | 1 refills | Status: DC

## 2017-05-05 NOTE — Telephone Encounter (Signed)
Recommendations discussed with patient, who verbalized understanding and thanks. He is aware med may take several days for full effect, to follow BPs after increased dose, follow up at visit as scheduled, call if new/continued concerns in interim.  Rx sent to [facility] at patient request.

## 2017-05-05 NOTE — Telephone Encounter (Signed)
New Message     Started monitoring it yesterday : Pt thinks his bp is high 160-185/110  , dizziness and nausea , in previous days

## 2017-05-05 NOTE — Telephone Encounter (Signed)
OK to increase lisinopril to 20 mg daily MCr

## 2017-05-05 NOTE — Telephone Encounter (Signed)
Pt of Dr. Debara Pickett Seen in Oct by Gerald Stabs,  Due for f/u appt w Dr. Barbaraann Barthel to patient.  He notes he used to be diligent about checking his BP, hadn't done so lately, but noted he felt off in last few days, has also been under stress d/t a court situation he'll be involved in for the next 1-2 weeks. BPs, as he noted in initial call, has been elevated for the last few days when checked - was ranging 160-185/100-110 since yesterday. He's taking his meds as advised - seeking recommendations for getting his BP down.  Scheduled pt for f/u appt w Dr. Debara Pickett (appts available next week, which pt declined - next open spot was 5/30 to which he was agreeable)  Informed pt I would seek advice from [facility] - please advise if reasonable to increase lisinopril or try other med, or continue to monitor.

## 2017-05-07 ENCOUNTER — Other Ambulatory Visit: Payer: Self-pay | Admitting: Internal Medicine

## 2017-05-24 ENCOUNTER — Encounter: Payer: Self-pay | Admitting: *Deleted

## 2017-05-24 ENCOUNTER — Other Ambulatory Visit: Payer: Self-pay | Admitting: Internal Medicine

## 2017-05-24 ENCOUNTER — Ambulatory Visit: Payer: 59 | Admitting: Internal Medicine

## 2017-05-29 ENCOUNTER — Other Ambulatory Visit: Payer: Self-pay | Admitting: Internal Medicine

## 2017-05-29 NOTE — Telephone Encounter (Signed)
Rx request sent to pharmacy.  

## 2017-06-06 ENCOUNTER — Telehealth: Payer: Self-pay | Admitting: Internal Medicine

## 2017-06-06 NOTE — Telephone Encounter (Signed)
New message   Patient calling   Pt c/o BP issue: STAT if pt c/o blurred vision, one-sided weakness or slurred speech  1. What are your last 5 BP readings? 167/110 yesterday   2. Are you having any other symptoms (ex. Dizziness, headache, blurred vision, passed out)? Dizziness upon standing    3. What is your BP issue? Some nausea / sick to stomach not been able to eat right.

## 2017-06-06 NOTE — Telephone Encounter (Signed)
Returned call to patient He called in to triage in May and lisinopril dose was increased to 20mg  He reports elevated BP for at least a month He was scheduled for an appointment w/Dr. Debara Pickett on 5/30 - no show He has dizziness on standing, nausea, gastrointestinal region hurts  BP 6/11 - 167/110 He has misplaced his BP cuff so he does not have a log on hand to review He does not have a BP reading from today  Informed patient I would send message to MD and pharmacy staff for advice on BP concerns - hypertension clinic appt appropriate? And first available with MD?

## 2017-06-06 NOTE — Telephone Encounter (Signed)
LM for patient that it was recommended he have BP check appt this week and there is availability. Advised to call back and ask for scheduler and make appointment.

## 2017-06-06 NOTE — Telephone Encounter (Signed)
Would have him come in if we've already adjusted meds once last month.  I think we have openings on Thursday in [facility]

## 2017-06-09 NOTE — Telephone Encounter (Signed)
Left detailed message(DPR) to call back and schedule [facility] appt-per kristin [facility]

## 2017-06-12 ENCOUNTER — Other Ambulatory Visit: Payer: Self-pay | Admitting: Internal Medicine

## 2017-08-14 ENCOUNTER — Other Ambulatory Visit: Payer: Self-pay | Admitting: Internal Medicine

## 2017-08-28 ENCOUNTER — Other Ambulatory Visit: Payer: Self-pay | Admitting: Cardiovascular Disease

## 2017-08-29 NOTE — Telephone Encounter (Signed)
REFILL 

## 2017-09-02 ENCOUNTER — Other Ambulatory Visit: Payer: Self-pay | Admitting: Internal Medicine

## 2017-10-07 ENCOUNTER — Other Ambulatory Visit: Payer: Self-pay | Admitting: Cardiovascular Disease

## 2017-10-30 ENCOUNTER — Other Ambulatory Visit: Payer: Self-pay | Admitting: Internal Medicine

## 2017-10-31 ENCOUNTER — Ambulatory Visit: Payer: Self-pay | Admitting: Internal Medicine

## 2017-11-01 ENCOUNTER — Encounter: Payer: Self-pay | Admitting: *Deleted

## 2017-12-22 ENCOUNTER — Other Ambulatory Visit: Payer: Self-pay | Admitting: Internal Medicine

## 2017-12-24 ENCOUNTER — Other Ambulatory Visit: Payer: Self-pay | Admitting: Internal Medicine

## 2018-01-21 ENCOUNTER — Other Ambulatory Visit: Payer: Self-pay | Admitting: Internal Medicine

## 2018-01-22 NOTE — Telephone Encounter (Signed)
Rx request sent to pharmacy.  

## 2018-01-23 ENCOUNTER — Telehealth: Payer: Self-pay | Admitting: Internal Medicine

## 2018-01-23 NOTE — Telephone Encounter (Signed)
Unable to reach pt or leave a message  

## 2018-01-23 NOTE — Telephone Encounter (Signed)
New Message:   Pt says his blood pressure have been running and he just doesn't feel good.He has an appt tomorrow with Merlinda Frederick call to evaluate,

## 2018-01-24 ENCOUNTER — Encounter: Payer: Self-pay | Admitting: Adult Health

## 2018-01-24 ENCOUNTER — Ambulatory Visit: Payer: BLUE CROSS/BLUE SHIELD | Admitting: Adult Health

## 2018-01-24 ENCOUNTER — Encounter (INDEPENDENT_AMBULATORY_CARE_PROVIDER_SITE_OTHER): Payer: Self-pay

## 2018-01-24 VITALS — BP 144/100 | HR 66 | Ht 69.0 in | Wt 235.0 lb

## 2018-01-24 DIAGNOSIS — I10 Essential (primary) hypertension: Secondary | ICD-10-CM | POA: Diagnosis not present

## 2018-01-24 DIAGNOSIS — I2510 Atherosclerotic heart disease of native coronary artery without angina pectoris: Secondary | ICD-10-CM

## 2018-01-24 DIAGNOSIS — R5383 Other fatigue: Secondary | ICD-10-CM

## 2018-01-24 DIAGNOSIS — E7800 Pure hypercholesterolemia, unspecified: Secondary | ICD-10-CM

## 2018-01-24 DIAGNOSIS — Z79899 Other long term (current) drug therapy: Secondary | ICD-10-CM | POA: Diagnosis not present

## 2018-01-24 MED ORDER — CHLORTHALIDONE 25 MG PO TABS: 12.5000 mg | ORAL_TABLET | Freq: Every day | ORAL | 5 refills | Status: DC

## 2018-01-24 NOTE — Patient Instructions (Signed)
Medication Instructions:  START CHLORTHALIDONE 12.5MG  (1/2 25MG  TAB) DAILY  If you need a refill on your cardiac medications before your next appointment, please call your pharmacy.  Labwork: BMET, FLP, LFT, A1C AND MAG TODAY HERE IN OUR OFFICE AT [facility]  Testing/Procedures: Echocardiogram - Your physician has requested that you have an echocardiogram. Echocardiography is a painless test that uses sound waves to create images of your heart. It provides your doctor with information about the size and shape of your heart and how well your heart's chambers and valves are working. This procedure takes approximately one hour. There are no restrictions for this procedure. This will be performed at our [facility] location - [address].  Your physician has requested that you have en exercise stress myoview. A cardiac stress test is a cardiological test that measures the heart's ability to respond to external stress in a controlled clinical environment. The stress response is induced by exercise or by intravenous pharmacological stimulation   For further information please visit [email or web address]. Please follow instruction sheet, as given. MAKE SURE THAT YOU HOLD METOPROLOL THE DAY OF THE TESTING  Follow-Up: Your physician wants you to follow-up in: 2-3 [place], DNP.    Thank you for choosing [facility] at [facility]!!     Fat and Cholesterol Restricted Diet Getting too much fat and cholesterol in your diet may cause health problems. Following this diet helps keep your fat and cholesterol at normal levels. This can keep you from getting sick. What types of fat should I choose?  Choose monosaturated and polyunsaturated fats. These are found in foods such as olive oil, canola oil, flaxseeds, walnuts, almonds, and seeds.  Eat more omega-3 fats. Good choices include salmon, mackerel, sardines, tuna, flaxseed oil, and ground flaxseeds.  Limit  saturated fats. These are in animal products such as meats, butter, and cream. They can also be in plant products such as palm oil, palm kernel oil, and coconut oil.  Avoid foods with partially hydrogenated oils in them. These contain trans fats. Examples of foods that have trans fats are stick margarine, some tub margarines, cookies, crackers, and other baked goods. What general guidelines do I need to follow?  Check food labels. Look for the words "trans fat" and "saturated fat."  When preparing a meal: ? Fill half of your plate with vegetables and green salads. ? Fill one fourth of your plate with whole grains. Look for the word "whole" as the first word in the ingredient list. ? Fill one fourth of your plate with lean protein foods.  Eat more foods that have fiber, like apples, carrots, beans, peas, and barley.  Eat more home-cooked foods. Eat less at restaurants and buffets.  Limit or avoid alcohol.  Limit foods high in starch and sugar.  Limit fried foods.  Cook foods without frying them. Baking, boiling, grilling, and broiling are all great options.  Lose weight if you are overweight. Losing even a small amount of weight can help your overall health. It can also help prevent diseases such as diabetes and heart disease. What foods can I eat? Grains Whole grains, such as whole wheat or whole grain breads, crackers, cereals, and pasta. Unsweetened oatmeal, bulgur, barley, quinoa, or brown rice. Corn or whole wheat flour tortillas. Vegetables Fresh or frozen vegetables (raw, steamed, roasted, or grilled). Green salads. Fruits All fresh, canned (in natural juice), or frozen fruits. Meat and Other Protein Products Ground beef (85%  or leaner), grass-fed beef, or beef trimmed of fat. Skinless chicken or [place]. Ground chicken or [place]. Pork trimmed of fat. All fish and seafood. Eggs. Dried beans, peas, or lentils. Unsalted nuts or seeds. Unsalted canned or dry beans. Dairy Low-fat  dairy products, such as skim or 1% milk, 2% or reduced-fat cheeses, low-fat ricotta or cottage cheese, or plain low-fat yogurt. Fats and Oils Tub margarines without trans fats. Light or reduced-fat mayonnaise and salad dressings. Avocado. Olive, canola, sesame, or safflower oils. Natural peanut or almond butter (choose ones without added sugar and oil). The items listed above may not be a complete list of recommended foods or beverages. Contact your dietitian for more options. What foods are not recommended? Grains White bread. White pasta. White rice. Cornbread. Bagels, pastries, and croissants. Crackers that contain trans fat. Vegetables White potatoes. Corn. Creamed or fried vegetables. Vegetables in a cheese sauce. Fruits Dried fruits. Canned fruit in light or heavy syrup. Fruit juice. Meat and Other Protein Products Fatty cuts of meat. Ribs, chicken wings, bacon, sausage, bologna, salami, chitterlings, fatback, hot dogs, bratwurst, and packaged luncheon meats. Liver and organ meats. Dairy Whole or 2% milk, cream, half-and-half, and cream cheese. Whole milk cheeses. Whole-fat or sweetened yogurt. Full-fat cheeses. Nondairy creamers and whipped toppings. Processed cheese, cheese spreads, or cheese curds. Sweets and Desserts Corn syrup, sugars, honey, and molasses. Candy. Jam and jelly. Syrup. Sweetened cereals. Cookies, pies, cakes, donuts, muffins, and ice cream. Fats and Oils Butter, stick margarine, lard, shortening, ghee, or bacon fat. Coconut, palm kernel, or palm oils. Beverages Alcohol. Sweetened drinks (such as sodas, lemonade, and fruit drinks or punches). The items listed above may not be a complete list of foods and beverages to avoid. Contact your dietitian for more information. This information is not intended to replace advice given to you by your health care provider. Make sure you discuss any questions you have with your health care provider. Document Released: 06/12/2012  Document Revised: 08/18/2016 Document Reviewed: 03/13/2014 Elsevier Interactive Patient Education  [employer].

## 2018-01-24 NOTE — Telephone Encounter (Signed)
Patient evaluated in office by K. Purcell Nails, DNP on 01/23/18

## 2018-01-24 NOTE — Progress Notes (Addendum)
Cardiology Office Note   Date:  01/24/2018   ID:  Kenneth [name], DOB [birth year], MRN [id number]  PCP:  Curly Rim, MD  Cardiologist: Dr. Debara Pickett Chief Complaint  Patient presents with  . Follow-up    Discuss BP.  Marland Kitchen Coronary Artery Disease     History of Present Illness: Kenneth [name] is a 52 y.o. male who presents for ongoing assessment and management of chest pain, known history of coronary artery disease, with coronary artery bypass grafting on 03/18/2014 (LIMA to LAD, SVG to OM, sequential SVG to PDA and PL) other history includes hypertension, hyperlipidemia, obesity, depression, anemia.  The patient was last seen by Dr. Debara Pickett on 01/26/2016.  Is a history of polysubstance abuse, and on last office visit Dr. Debara Pickett noted that he was taking several Ambien at a time.     He was seen on follow-up by Ignacia Bayley,  NP on 10/06/2016 for preoperative evaluation for left hip replacement. At that time he was cleared from a cardiac standpoint to proceed.  The patient called our office on 01/23/2018 complaining of elevated blood pressure.  Having not been seen in the office for in over one year he has made an appointment to be seen today.  He comes today feeling fatigued, having difficulty controlling blood pressure, but denies any chest pain or dyspnea on exertion. The patient was lost to follow-up due to insurance issues his wife changed her job, and he had to be established with a different insurance company.   The patient has been taking increased doses of lisinopril, has been prescribed 20 mg daily but has been taking 20 mg twice a day. He has not had any recent labs checking cholesterol status although he is on atorvastatin. He has gained some weight over the last year and has not been very active.  Past Medical History:  Diagnosis Date  . Arthritis   . CAD (coronary artery disease)    a. 02/2014 Abnl MV with defect in LCX territory;  b. 02/2014 Cath: LAD 60-70p, 7m, LCX 100 CTO, OM1 95p,  99d, RCA 100 CTO w/ L->R collaterals; c. 02/2014 CABG x 4 (LIMA->LAD, VG->OM, VG->RPDA->RPL); d. 01/2016 MV: 41%, med size, sev intensity fixed basal inferior lateral defect w/o ischemia.  . Carotid arterial disease ([place])    a. 06/2013 Carotid U/S: moderate narrowing of both subclavian arteries, with normal carotid arteries;  b. 02/2014 Carotid U/S: 1-39% bilat ICA stenosis.  Marland Kitchen History of echocardiogram    a. 02/2016 Echo: EF 50-55%, no rwma, mildly dil LA, nl RV fxn, mildly dil RA.  Marland Kitchen Hyperlipidemia   . Hypertensive heart disease   . Syndrome X, cardiac ([place])    a. Previously felt to have microvascular angina following remote cath in [place]-->treated with nitrates, ranexa, EECP.  Later found to have multivessel CAD.    Past Surgical History:  Procedure Laterality Date  . CARDIAC CATHETERIZATION  2005  . CORONARY ARTERY BYPASS GRAFT N/A 03/18/2014   Procedure: CORONARY ARTERY BYPASS GRAFTING (CABG) x 4 using endoscopically harvested right saphenous vein and left internal mammary artery;  Surgeon: Gaye Pollack, MD;  Location: [facility] OR;  Service: Open Heart Surgery;  Laterality: N/A;  . HAND SURGERY Left   . INTRAOPERATIVE TRANSESOPHAGEAL ECHOCARDIOGRAM N/A 03/18/2014   Procedure: INTRAOPERATIVE TRANSESOPHAGEAL ECHOCARDIOGRAM;  Surgeon: Gaye Pollack, MD;  Location: [facility] OR;  Service: Open Heart Surgery;  Laterality: N/A;  . KIDNEY STONE SURGERY  age 41  . LEFT HEART CATHETERIZATION WITH CORONARY ANGIOGRAM N/A  03/14/2014   Procedure: LEFT HEART CATHETERIZATION WITH CORONARY ANGIOGRAM;  Surgeon: Troy Sine, MD;  Location: [facility] CATH LAB;  Service: Cardiovascular;  Laterality: N/A;  . SHOULDER ARTHROSCOPY  2001   L shoulder  . TOTAL HIP ARTHROPLASTY Left 10/26/2016   Procedure: LEFT TOTAL HIP ARTHROPLASTY ANTERIOR APPROACH;  Surgeon: Gaynelle Arabian, MD;  Location: [facility] ORS;  Service: Orthopedics;  Laterality: Left;     Current Outpatient Medications  Medication Sig Dispense Refill  . aspirin EC 325 MG  tablet TAKE 1 TABLET BY MOUTH EVERY DAY 30 tablet 6  . atorvastatin (LIPITOR) 80 MG tablet TAKE 1 TABLET (80 MG TOTAL) BY MOUTH AT BEDTIME. 90 tablet 1  . ezetimibe (ZETIA) 10 MG tablet TAKE 1 TABLET EVERY DAY 30 tablet 10  . gemfibrozil (LOPID) 600 MG tablet TAKE 1 TABLET (600 MG TOTAL) BY MOUTH 2 (TWO) TIMES DAILY. 60 tablet 6  . lisinopril (PRINIVIL,ZESTRIL) 20 MG tablet Take 1 tablet (20 mg total) by mouth daily. Keep appt. 30 tablet 1  . loperamide (IMODIUM) 2 MG capsule Take 2 mg by mouth as needed for diarrhea or loose stools.    . metoprolol tartrate (LOPRESSOR) 25 MG tablet TAKE 1 TABLET BY MOUTH TWICE A DAY 180 tablet 1  . traMADol (ULTRAM) 50 MG tablet Take 1-2 tablets (50-100 mg total) by mouth every 6 (six) hours as needed (mild pain). 80 tablet 1  . zolpidem (AMBIEN) 10 MG tablet Take 10 mg by mouth at bedtime as needed. for sleep  0  . rivaroxaban (XARELTO) 10 MG TABS tablet Take 1 tablet (10 mg total) by mouth daily with breakfast. Take Xarelto for two and a half more weeks, then discontinue Xarelto. Once the patient has completed the Xarelto, they may resume the 325 mg Aspirin. (Patient not taking: Reported on 01/24/2018) 20 tablet 0  . VYVANSE 40 MG capsule Take 40 mg by mouth every morning.  0   No current facility-administered medications for this visit.     Allergies:   Tetracyclines & related    Social History:  The patient  reports that he quit smoking about 5 years ago. His smoking use included cigars. he has never used smokeless tobacco. He reports that he does not drink alcohol or use drugs.   Family History:  The patient's family history includes Cancer in his father; Diabetes in his mother; Heart attack in his father and mother; Stroke in his mother.    ROS: All other systems are reviewed and negative. Unless otherwise mentioned in H&P    PHYSICAL EXAM: VS:  BP (!) 144/100   Pulse 66   Ht 5\' 9"  (1.753 m)   Wt 235 lb (106.6 kg)   BMI 34.70 kg/m  , BMI Body  mass index is 34.7 kg/m. GEN: Well nourished, well developed, in no acute distress  HEENT: normal  Neck: no JVD, carotid bruits, or masses Cardiac: RRR; no murmurs, rubs, or gallops,no edema  Respiratory:  Clear to auscultation bilaterally, normal work of breathing GI: soft, nontender, nondistended, + BS MS: no deformity or atrophy  Skin: warm and dry, no rash Neuro:  Strength and sensation are intact Psych: euthymic mood, full affect   EKG:  Normal sinus rhythm with evidence of LVH, heart rate of 66 bpm. No ST-T wave changes.  Recent Labs: No results found for requested labs within last 8760 hours.    Lipid Panel    Component Value Date/Time   CHOL 184 08/18/2015 1104   TRIG  143 08/18/2015 1104   HDL 28 (L) 08/18/2015 1104   CHOLHDL 5.5 03/13/2014 1100   VLDL 22 03/13/2014 1100   LDLCALC 127 (H) 08/18/2015 1104      Wt Readings from Last 3 Encounters:  01/24/18 235 lb (106.6 kg)  10/26/16 221 lb (100.2 kg)  10/17/16 221 lb (100.2 kg)      Other studies Reviewed: Cardiac Cath 03/14/2014  The left main coronary artery was angiographically normal and trifurcated into an LAD, a ramus intermediate vessel, and left circumflex coronary artery.  The LAD gave rise to a proximal septal perforating artery and then had a 60-70% stenosis beyond a septal vessel. There was an additional 50% mid LAD stenosis. There was significant septal collaterals supplying the distal RCA.  The Ramus  Intermediate vessel was angiographically normal.  The left circumflex cardiotomy ice to the proximal marginal vessel. This marginal vessel had 95% stenosis at its origin. It was subtotally stenosed in the distal aspect. The circumflex vessel was totally occluded almost immediately beyond the takeoff of the small marginal vessel. This appeared to be a chronic total occlusion with a long occluded segment. However, there was filling of a distal marginal and distal circumflex via collaterals.  The  right coronary artery appeared to have a prior diffuse new subtotal stenosis proximally and there was extensive bridging collateralization and small vessels. There also was an aneurysmal segment with bridging collaterals seen to enter. The distal RCA was totally occluded after the acute margin branch. There were extensive left to right collaterals supplying the distal PDA and PLA vessel.  Left ventriculography revealed an ejection fraction of approximately 50%. There was mild anterolateral hypocontractility   IMPRESSION:  Low normal to mild LV dysfunction with an ejection fraction of 50% with subtle mild anterolateral hypocontractility.  Significant multivessel coronary obstructive disease with 60-70% LAD stenosis after the first septal perforating artery and 50% mid LAD stenosis, probable chronic occlusion of the left circumflex vessel with collateralization to the distal marginal branch and 95% stenosis in the first marginal branch with 99% stenosis in its distal segment; and probable previous subtotal stenosis of the proximal RCA with bridging collaterals and evidence for a total distal RCA stenosis with evidence for left to right collaterals.    ASSESSMENT AND PLAN:  1.  Coronary artery disease: History of CABG in 2015. The patient has been feeling more fatigued, has been hypertensive, and having more difficulty controlling this. He has increased his lisinopril on his own and 20 mg twice a day.he is finding that his blood pressure remains elevated. I will plan a stress Myoview to evaluate for progression of CAD with known and ongoing cardiovascular risk factors of hypertension hypercholesterolemia sedentary lifestyle. The patient is also requesting repeat stress test.  2. Hypertension: Blood pressure is elevated today at 144/100 The patient was noticed that his home blood pressure readings correlate with this with some diastolic pressures greater than 100. I'm going to repeat  echocardiogram to evaluate for LV function and diastolic dysfunction number. Therefore he was started on chlorthalidone 12.5 mg daily and continue him on lisinopril 20 mg daily. He is given a blood pressure recording sheet and he is to take his blood pressure daily. We will see him back on follow-up to ascertain response to medications and review his blood pressure recordings.  3. Hypercholesterolemia: He is no longer taking, but continues on atorvastatin. He is not adhering to a low cholesterol diet. I'm going to repeat his lipids and LFTs to evaluate  his status. He is given written instructions on heart healthy low cholesterol diet.   4. Fatigue with obesity: I advised him to begin a walking program of at least 30 minutes a day to assist with stamina, lower blood pressure, and heart rate.  Current medicines are reviewed at length with the patient today.    Labs/ tests ordered today include: fasting lipids and LFTs, BMET, hemoglobin A1c, and magnesium. He will be scheduled for an appointment stress test for evaluation of progressive CAD, an echocardiogram for evaluation of LV function and diastolic dysfunction on difficult to control hypertension.  Phill Myron. West Pugh, ANP, AACC    01/24/2018 10:10 AM    [facility]. [address], [place], [place] [address] Phone: [phone]; Fax: [phone]

## 2018-01-25 ENCOUNTER — Telehealth: Payer: Self-pay

## 2018-01-25 LAB — MAGNESIUM: Magnesium: 1.7 mg/dL (ref 1.6–2.3)

## 2018-01-25 LAB — HEPATIC FUNCTION PANEL
ALBUMIN: 4.4 g/dL (ref 3.5–5.5)
ALK PHOS: 55 IU/L (ref 39–117)
ALT: 24 IU/L (ref 0–44)
AST: 28 IU/L (ref 0–40)
Bilirubin Total: 0.3 mg/dL (ref 0.0–1.2)
Bilirubin, Direct: 0.09 mg/dL (ref 0.00–0.40)
Total Protein: 7.2 g/dL (ref 6.0–8.5)

## 2018-01-25 LAB — BASIC METABOLIC PANEL
BUN/Creatinine Ratio: 10 (ref 9–20)
BUN: 12 mg/dL (ref 6–24)
CALCIUM: 9.1 mg/dL (ref 8.7–10.2)
CHLORIDE: 99 mmol/L (ref 96–106)
CO2: 25 mmol/L (ref 20–29)
Creatinine, Ser: 1.21 mg/dL (ref 0.76–1.27)
GFR, EST AFRICAN AMERICAN: 79 mL/min/{1.73_m2} (ref >59)
GFR, EST NON AFRICAN AMERICAN: 68 mL/min/{1.73_m2} (ref >59)
Glucose: 94 mg/dL (ref 65–99)
POTASSIUM: 4.2 mmol/L (ref 3.5–5.2)
SODIUM: 139 mmol/L (ref 134–144)

## 2018-01-25 LAB — LIPID PANEL
CHOLESTEROL TOTAL: 275 mg/dL — AB (ref 100–199)
Chol/HDL Ratio: 7.6 ratio — ABNORMAL HIGH (ref 0.0–5.0)
HDL: 36 mg/dL — AB (ref >39)
LDL CALC: 204 mg/dL — AB (ref 0–99)
TRIGLYCERIDES: 175 mg/dL — AB (ref 0–149)
VLDL Cholesterol Cal: 35 mg/dL (ref 5–40)

## 2018-01-25 LAB — HEMOGLOBIN A1C
ESTIMATED AVERAGE GLUCOSE: 105 mg/dL
HEMOGLOBIN A1C: 5.3 % (ref 4.8–5.6)

## 2018-01-25 MED ORDER — MAGNESIUM 200 MG PO TABS: 200.0000 mg | ORAL_TABLET | Freq: Every day | ORAL | 5 refills | Status: DC

## 2018-01-25 MED ORDER — EZETIMIBE 10 MG PO TABS: 10.0000 mg | ORAL_TABLET | Freq: Every day | ORAL | 3 refills | Status: DC

## 2018-01-25 NOTE — Telephone Encounter (Signed)
Pt notified he will start these medications-rx's sent to pharmacy all questions answered.  Labs sent to PCP as requested Pt will keep appts for upcoming testing

## 2018-01-25 NOTE — Telephone Encounter (Signed)
Per result note:  Notes recorded by Lendon Colonel, NP on 01/24/2018 at 4:50 PM EST Cholesterol is very elevated despite atorvastatin. He stopped Zetia. Restart this. Please refer him to lipid clinic for PCSK 9 inhibitor therapy.   Per med hx:  ezetimibe (ZETIA) 10 MG tablet  Lm2cb

## 2018-01-31 ENCOUNTER — Other Ambulatory Visit: Payer: Self-pay

## 2018-01-31 ENCOUNTER — Ambulatory Visit (HOSPITAL_COMMUNITY): Payer: BLUE CROSS/BLUE SHIELD | Attending: Cardiology

## 2018-01-31 DIAGNOSIS — I5030 Unspecified diastolic (congestive) heart failure: Secondary | ICD-10-CM | POA: Diagnosis not present

## 2018-01-31 DIAGNOSIS — R5383 Other fatigue: Secondary | ICD-10-CM | POA: Diagnosis present

## 2018-01-31 DIAGNOSIS — I10 Essential (primary) hypertension: Secondary | ICD-10-CM

## 2018-01-31 DIAGNOSIS — I2510 Atherosclerotic heart disease of native coronary artery without angina pectoris: Secondary | ICD-10-CM | POA: Diagnosis not present

## 2018-01-31 DIAGNOSIS — I420 Dilated cardiomyopathy: Secondary | ICD-10-CM | POA: Insufficient documentation

## 2018-02-07 ENCOUNTER — Telehealth (HOSPITAL_COMMUNITY): Payer: Self-pay

## 2018-02-07 NOTE — Telephone Encounter (Signed)
Encounter complete. 

## 2018-02-08 ENCOUNTER — Telehealth (HOSPITAL_COMMUNITY): Payer: Self-pay

## 2018-02-09 ENCOUNTER — Ambulatory Visit (HOSPITAL_COMMUNITY): Payer: BLUE CROSS/BLUE SHIELD

## 2018-02-09 NOTE — Telephone Encounter (Signed)
Complete

## 2018-02-12 ENCOUNTER — Ambulatory Visit: Payer: BLUE CROSS/BLUE SHIELD | Admitting: Adult Health

## 2018-02-12 NOTE — Progress Notes (Signed)
Yes. Please reschedule Lexiscan stress test. Make sure he is keeping trak of BP with addition of chlorthalidone.

## 2018-02-15 ENCOUNTER — Telehealth: Payer: Self-pay | Admitting: Internal Medicine

## 2018-02-15 NOTE — Telephone Encounter (Signed)
Closed Encounter  °

## 2018-02-17 ENCOUNTER — Other Ambulatory Visit: Payer: Self-pay | Admitting: Internal Medicine

## 2018-02-19 NOTE — Telephone Encounter (Signed)
°*  STAT* If patient is at the pharmacy, call can be transferred to refill team.   1. Which medications need to be refilled? (please list name of each medication and dose if known)  Lisinopril , 20 mg   2. Which pharmacy/location (including street and city if local pharmacy) is medication to be sent to? [id number] - [place], [place]  3. Do they need a 30 day or 90 day supply? Patient does not remember

## 2018-03-05 ENCOUNTER — Other Ambulatory Visit: Payer: Self-pay | Admitting: *Deleted

## 2018-03-05 MED ORDER — LISINOPRIL 20 MG PO TABS: 20.0000 mg | ORAL_TABLET | Freq: Every day | ORAL | 9 refills | Status: DC

## 2018-03-05 NOTE — Telephone Encounter (Signed)
Patient called and stated that his dose was changed to 20 mg qd but his rx is still for 10 mg qd.

## 2018-03-13 ENCOUNTER — Telehealth (HOSPITAL_COMMUNITY): Payer: Self-pay | Admitting: *Deleted

## 2018-03-13 NOTE — Telephone Encounter (Signed)
Patient given detailed instructions per Myocardial Perfusion Study Information Sheet for the test on 03/16/18. Patient notified to arrive 15 minutes early and that it is imperative to arrive on time for appointment to keep from having the test rescheduled.  If you need to cancel or reschedule your appointment, please call the office within 24 hours of your appointment. . Patient verbalized understanding. Kirstie Peri

## 2018-03-16 ENCOUNTER — Ambulatory Visit (HOSPITAL_COMMUNITY): Payer: BLUE CROSS/BLUE SHIELD | Attending: Cardiovascular Disease

## 2018-03-16 ENCOUNTER — Encounter (INDEPENDENT_AMBULATORY_CARE_PROVIDER_SITE_OTHER): Payer: Self-pay

## 2018-03-16 DIAGNOSIS — I10 Essential (primary) hypertension: Secondary | ICD-10-CM | POA: Diagnosis not present

## 2018-03-16 DIAGNOSIS — R5383 Other fatigue: Secondary | ICD-10-CM | POA: Diagnosis not present

## 2018-03-16 DIAGNOSIS — I2510 Atherosclerotic heart disease of native coronary artery without angina pectoris: Secondary | ICD-10-CM

## 2018-03-16 DIAGNOSIS — E785 Hyperlipidemia, unspecified: Secondary | ICD-10-CM | POA: Insufficient documentation

## 2018-03-16 LAB — MYOCARDIAL PERFUSION IMAGING
CHL CUP NUCLEAR SDS: 2
CHL CUP NUCLEAR SRS: 4
CHL CUP RESTING HR STRESS: 71 {beats}/min
LHR: 0.38
LV dias vol: 168 mL (ref 62–150)
LVSYSVOL: 90 mL
Peak HR: 131 {beats}/min
SSS: 6
TID: 0.96

## 2018-03-16 MED ORDER — TECHNETIUM TC 99M TETROFOSMIN IV KIT
32.4000 | PACK | Freq: Once | INTRAVENOUS | Status: AC | PRN
  Administered 2018-03-16: 32.4 via INTRAVENOUS
  Filled 2018-03-16: qty 33

## 2018-03-16 MED ORDER — REGADENOSON 0.4 MG/5ML IV SOLN
0.4000 mg | Freq: Once | INTRAVENOUS | Status: AC
  Administered 2018-03-16: 0.4 mg via INTRAVENOUS

## 2018-03-16 MED ORDER — TECHNETIUM TC 99M TETROFOSMIN IV KIT
10.3000 | PACK | Freq: Once | INTRAVENOUS | Status: AC | PRN
  Administered 2018-03-16: 10.3 via INTRAVENOUS
  Filled 2018-03-16: qty 11

## 2018-03-17 ENCOUNTER — Other Ambulatory Visit: Payer: Self-pay | Admitting: Internal Medicine

## 2018-03-28 ENCOUNTER — Telehealth: Payer: Self-pay | Admitting: Adult Health

## 2018-03-28 NOTE — Telephone Encounter (Signed)
SPOKE WITH PATIENT. PATIENT SOME CLARIFICATION  OF  RESULT LEFT ON VOICEMAIL. CONCERNING A PRIOR MYOCARDIAL INFARCTION.  RN  REPORTED ON 2019 AND 2017 REPORT. PATIENT VERBALIZED UNDERSTANDING.

## 2018-03-28 NOTE — Telephone Encounter (Signed)
New message ° ° ° °Patient calling to discuss stress test results °

## 2018-04-14 ENCOUNTER — Other Ambulatory Visit: Payer: Self-pay | Admitting: Internal Medicine

## 2018-04-27 ENCOUNTER — Other Ambulatory Visit: Payer: Self-pay | Admitting: Internal Medicine

## 2018-04-27 NOTE — Telephone Encounter (Signed)
Rx sent to pharmacy   

## 2018-06-28 ENCOUNTER — Other Ambulatory Visit: Payer: Self-pay | Admitting: Internal Medicine

## 2018-07-19 ENCOUNTER — Other Ambulatory Visit: Payer: Self-pay | Admitting: Adult Health

## 2018-07-19 NOTE — Telephone Encounter (Signed)
Rx request sent to pharmacy.  

## 2018-07-20 ENCOUNTER — Other Ambulatory Visit: Payer: Self-pay | Admitting: Internal Medicine

## 2018-07-20 NOTE — Telephone Encounter (Signed)
Rx request sent to pharmacy.  

## 2018-08-09 ENCOUNTER — Other Ambulatory Visit: Payer: Self-pay | Admitting: Internal Medicine

## 2018-08-16 DIAGNOSIS — Z736 Limitation of activities due to disability: Secondary | ICD-10-CM

## 2018-09-02 ENCOUNTER — Other Ambulatory Visit: Payer: Self-pay | Admitting: Internal Medicine

## 2019-01-07 ENCOUNTER — Other Ambulatory Visit: Payer: Self-pay | Admitting: Internal Medicine

## 2019-01-27 ENCOUNTER — Other Ambulatory Visit: Payer: Self-pay | Admitting: Internal Medicine

## 2019-01-28 NOTE — Telephone Encounter (Signed)
Rx request sent to pharmacy.  

## 2019-03-27 ENCOUNTER — Telehealth: Payer: Self-pay | Admitting: Internal Medicine

## 2019-03-27 NOTE — Telephone Encounter (Signed)
New Message    Patient states that he is returning call in reference to his upcoming appt. Please call.

## 2019-03-27 NOTE — Telephone Encounter (Signed)
Virtual Visit Pre-Appointment Phone Call  Kenneth [name] has been deemed a candidate for a follow-up tele-health visit to limit community exposure during the Covid-19 pandemic. I spoke with the patient via phone to ensure availability of phone/video source, confirm preferred email & phone number, and discuss instructions and expectations.  I reminded Kenneth [name] to be prepared with any vital sign and/or heart rhythm information that could potentially be obtained via home monitoring, at the time of his visit. I reminded Kenneth [name] to expect a phone call at the time of his visit if his visit.  Did the patient verbally acknowledge consent to treatment? Patient provided verbal consent.  Kenneth [name], [place] 03/27/2019 2:29 PM   DOWNLOADING THE WEBEX SOFTWARE TO SMARTPHONE  - If Apple, go to [employer] and type in WebEx in the search bar. [place] [employer], the blue/green circle. The app is free but as with any other app downloads, their phone may require them to verify saved payment information or Apple password. The patient does NOT have to create an account.  - If Android, ask patient to go to [employer] and type in WebEx in the search bar. [place] [employer], the blue/green circle. The app is free but as with any other app downloads, their phone may require them to verify saved payment information or Android password. The patient does NOT have to create an account.   CONSENT FOR TELE-HEALTH VISIT - PLEASE REVIEW  I hereby voluntarily request, consent and authorize [facility] and its employed or contracted physicians, physician assistants, nurse practitioners or other licensed health care professionals (the Practitioner), to provide me with telemedicine health care services (the "Services") as deemed necessary by the treating Practitioner. I acknowledge and consent to receive the Services by the Practitioner via telemedicine. I understand that the  telemedicine visit will involve communicating with the Practitioner through live audiovisual communication technology and the disclosure of certain medical information by electronic transmission. I acknowledge that I have been given the opportunity to request an in-person assessment or other available alternative prior to the telemedicine visit and am voluntarily participating in the telemedicine visit.  I understand that I have the right to withhold or withdraw my consent to the use of telemedicine in the course of my care at any time, without affecting my right to future care or treatment, and that the Practitioner or I may terminate the telemedicine visit at any time. I understand that I have the right to inspect all information obtained and/or recorded in the course of the telemedicine visit and may receive copies of available information for a reasonable fee.  I understand that some of the potential risks of receiving the Services via telemedicine include:  Marland Kitchen Delay or interruption in medical evaluation due to technological equipment failure or disruption; . Information transmitted may not be sufficient (e.g. poor resolution of images) to allow for appropriate medical decision making by the Practitioner; and/or  . In rare instances, security protocols could fail, causing a breach of personal health information.  Furthermore, I acknowledge that it is my responsibility to provide information about my medical history, conditions and care that is complete and accurate to the best of my ability. I acknowledge that Practitioner's advice, recommendations, and/or decision may be based on factors not within their control, such as incomplete or inaccurate data provided by me or distortions of diagnostic images or specimens that may result from electronic transmissions. I understand that the practice of medicine is  not an Chief Strategy Officer and that Practitioner makes no warranties or guarantees regarding treatment  outcomes. I acknowledge that I will receive a copy of this consent concurrently upon execution via email to the email address I last provided but may also request a printed copy by calling the office of [place].    I understand that my insurance will be billed for this visit.   I have read or had this consent read to me. . I understand the contents of this consent, which adequately explains the benefits and risks of the Services being provided via telemedicine.  . I have been provided ample opportunity to ask questions regarding this consent and the Services and have had my questions answered to my satisfaction. . I give my informed consent for the services to be provided through the use of telemedicine in my medical care  By participating in this telemedicine visit I agree to the above

## 2019-03-28 ENCOUNTER — Telehealth (INDEPENDENT_AMBULATORY_CARE_PROVIDER_SITE_OTHER): Payer: Self-pay | Admitting: Internal Medicine

## 2019-03-28 ENCOUNTER — Encounter: Payer: Self-pay | Admitting: Internal Medicine

## 2019-03-28 VITALS — BP 111/85 | HR 90 | Ht 69.0 in | Wt 222.0 lb

## 2019-03-28 DIAGNOSIS — I10 Essential (primary) hypertension: Secondary | ICD-10-CM

## 2019-03-28 DIAGNOSIS — I2510 Atherosclerotic heart disease of native coronary artery without angina pectoris: Secondary | ICD-10-CM

## 2019-03-28 DIAGNOSIS — Z951 Presence of aortocoronary bypass graft: Secondary | ICD-10-CM

## 2019-03-28 DIAGNOSIS — E7849 Other hyperlipidemia: Secondary | ICD-10-CM

## 2019-03-28 DIAGNOSIS — N528 Other male erectile dysfunction: Secondary | ICD-10-CM

## 2019-03-28 DIAGNOSIS — E7800 Pure hypercholesterolemia, unspecified: Secondary | ICD-10-CM

## 2019-03-28 MED ORDER — SILDENAFIL CITRATE 50 MG PO TABS: 50.0000 mg | ORAL_TABLET | Freq: Every day | ORAL | 0 refills | Status: DC | PRN

## 2019-03-28 MED ORDER — METOPROLOL SUCCINATE ER 25 MG PO TB24: 25.0000 mg | ORAL_TABLET | Freq: Every day | ORAL | 3 refills | Status: DC

## 2019-03-28 NOTE — Patient Instructions (Signed)
Medication Instructions:  STOP GEMFIBROZIL  STOP CURRENT METOPROLOL AND START METOPROLOL SUCC ER 25 MG ONE TABLET AT BEDTIME  START VIAGRA 50 MG ONE TABLET AS NEEDED  If you need a refill on your cardiac medications before your next appointment, please call your pharmacy.   Lab work: Your physician recommends that you return for lab work in: [place]  If you have labs (blood work) drawn today and your tests are completely normal, you will receive your results only by: Marland Kitchen MyChart Message (if you have MyChart) OR . A paper copy in the mail If you have any lab test that is abnormal or we need to change your treatment, we will call you to review the results.   Follow-Up: At [facility], you and your health needs are our priority.  As part of our continuing mission to provide you with exceptional heart care, we have created designated Provider Care Teams.  These Care Teams include your primary Cardiologist (physician) and Advanced Practice Providers (APPs -  Physician Assistants and Nurse Practitioners) who all work together to provide you with the care you need, when you need it. You will need a follow up appointment in 4 MONTHS.  Please call our office 2 months in advance to schedule this appointment.  You may see DR HILTYor one of the following Advanced Practice Providers on your designated Care Team: Almyra Deforest, [place] . Fabian Sharp, PA-C  PHARM MD WILL CONTACT YOU REGARDING STARTING REPATHA

## 2019-03-28 NOTE — Progress Notes (Signed)
Virtual Visit via Telephone Note    Evaluation Performed:  Routine follow-up  This visit type was conducted due to national recommendations for restrictions regarding the COVID-19 Pandemic (e.g. social distancing).  This format is felt to be most appropriate for this patient at this time.  All issues noted in this document were discussed and addressed.  No physical exam was performed (except for noted visual exam findings with Video Visits).  Please refer to the patient's chart (MyChart message for video visits and phone note for telephone visits) for the patient's consent to telehealth for [facility].  Date:  03/28/2019   ID:  Kenneth [name], DOB 1966-08-14, MRN [id number]  Patient Location:  [place] [address]  Provider location:   [address] [place], [place] [address]  PCP:  Curly Rim, MD  Cardiologist:  No primary care provider on file. Electrophysiologist:  None   Chief Complaint:  Muscle aches, burning  History of Present Illness:    Kenneth [name] is a 53 y.o. male who presents via audio/video conferencing for a telehealth visit today.  Mr. Banks is a longtime patient of mine with a history of coronary artery disease and most likely familial hyperlipidemia.  He had early onset heart disease and was initially diagnosed with variant angina in [place] and treated aggressively with high-dose nitrates and ranolazine initially after cath apparently demonstrated no significant coronary disease.  Subsequently after failing medical therapy underwent repeat cath here which demonstrated severe multivessel coronary disease in 2015.  He then underwent four-vessel CABG with LIMA to LAD, SVG to OM and sequential SVG to RPDA and RPL.  LVEF at the time was 41% with an inferior defect.  Cath had shown chronic total occlusion of the right coronary artery with left-to-right collaterals.  Subsequently there was some mild improvement in LV function up to  50 to 55% however in January 2019 at the time when he was having increased blood pressures and chest discomfort, he underwent repeat stress testing and an echocardiogram.  The echo showed an EF of 40 to 45% with inferior akinesis.  A stress test then showed an EF of 46% with a fixed inferior defect suggestive of scar, with no reversible ischemia.  After medication adjustment his blood pressures improved.  Over the past year he is done fairly well, but has a number of concerns today.  He has reported inability to exercise due to significant muscle fatiguing and a feeling of lactic acidosis.  Previously was more active in fact was heavily involved in the martial arts.  In reviewing his medications I can see that he is on a combination of atorvastatin and gemfibrozil as well as ezetimibe.  The statin and gemfibrozil combination however may increase the risk of myopathy.  Additionally, I advised him that there is no significant mortality benefit or decrease in cardiovascular events with gemfibrozil.  Most recently lab work 2 weeks ago indicated his total cholesterol was 204, triglycerides 150, HDL 33 and LDL 141.  Again this suggest without treatment his LDL would be closer to 280, most suggestive of familial hyperlipidemia.  He also mentions that for some reason he has been taking metoprolol tartrate only once daily at night.  He has concerns about erectile dysfunction and is asking about possible treatments for that.  He had only once taken Viagra before but had a headache without much improvement.  However at the time, he was on high-dose nitrates for chest pain and he was not  prescribed a Viagra, rather he took a pill from 1 of his friends, not realizing that Viagra should not be used with nitrates.  Fortunately he is no longer on nitrates.  He denies any chest pain.  He does get some shortness of breath with exertion.  He denies any orthopnea, PND, lower extremity swelling or other associated symptoms.  The  patient does not have symptoms concerning for COVID-19 infection (fever, chills, cough, or new SHORTNESS OF BREATH).    Prior CV studies:   The following studies were reviewed today:  Stress test (02/2018) Echocardiogram (01/2018)  PMHx:  Past Medical History:  Diagnosis Date   Arthritis    CAD (coronary artery disease)    a. 02/2014 Abnl MV with defect in LCX territory;  b. 02/2014 Cath: LAD 60-70p, 24m, LCX 100 CTO, OM1 95p, 99d, RCA 100 CTO w/ L->R collaterals; c. 02/2014 CABG x 4 (LIMA->LAD, VG->OM, VG->RPDA->RPL); d. 01/2016 MV: 41%, med size, sev intensity fixed basal inferior lateral defect w/o ischemia.   Carotid arterial disease ([place])    a. 06/2013 Carotid U/S: moderate narrowing of both subclavian arteries, with normal carotid arteries;  b. 02/2014 Carotid U/S: 1-39% bilat ICA stenosis.   History of echocardiogram    a. 02/2016 Echo: EF 50-55%, no rwma, mildly dil LA, nl RV fxn, mildly dil RA.   Hyperlipidemia    Hypertensive heart disease    Syndrome X, cardiac ([place])    a. Previously felt to have microvascular angina following remote cath in [place]-->treated with nitrates, ranexa, EECP.  Later found to have multivessel CAD.    Past Surgical History:  Procedure Laterality Date   CARDIAC CATHETERIZATION  2005   CORONARY ARTERY BYPASS GRAFT N/A 03/18/2014   Procedure: CORONARY ARTERY BYPASS GRAFTING (CABG) x 4 using endoscopically harvested right saphenous vein and left internal mammary artery;  Surgeon: Gaye Pollack, MD;  Location: [facility] OR;  Service: Open Heart Surgery;  Laterality: N/A;   HAND SURGERY Left    INTRAOPERATIVE TRANSESOPHAGEAL ECHOCARDIOGRAM N/A 03/18/2014   Procedure: INTRAOPERATIVE TRANSESOPHAGEAL ECHOCARDIOGRAM;  Surgeon: Gaye Pollack, MD;  Location[facility] OR;  Service: Open Heart Surgery;  Laterality: N/A;   KIDNEY STONE SURGERY  age 21   LEFT HEART CATHETERIZATION WITH CORONARY ANGIOGRAM N/A 03/14/2014   Procedure: LEFT HEART CATHETERIZATION WITH  CORONARY ANGIOGRAM;  Surgeon: Troy Sine, MD;  Location: [facility] CATH LAB;  Service: Cardiovascular;  Laterality: N/A;   SHOULDER ARTHROSCOPY  2001   L shoulder   TOTAL HIP ARTHROPLASTY Left 10/26/2016   Procedure: LEFT TOTAL HIP ARTHROPLASTY ANTERIOR APPROACH;  Surgeon: Gaynelle Arabian, MD;  Location: [facility] ORS;  Service: Orthopedics;  Laterality: Left;    FAMHx:  Family History  Problem Relation Age of Onset   Heart attack Mother    Stroke Mother    Diabetes Mother    Heart attack Father    Cancer Father        lung    SOCHx:   reports that he quit smoking about 6 years ago. His smoking use included cigars. He has never used smokeless tobacco. He reports that he does not drink alcohol or use drugs.  ALLERGIES:  Allergies  Allergen Reactions   Tetracyclines & Related Nausea And Vomiting    MEDS:  Current Meds  Medication Sig   atorvastatin (LIPITOR) 80 MG tablet TAKE 1 TABLET (80 MG TOTAL) BY MOUTH AT BEDTIME.   chlorthalidone (HYGROTON) 25 MG tablet Take 0.5 tablets (12.5 mg total) by mouth daily. Please  schedule appointment for refills.   [facility] ASPIRIN EC 325 MG EC tablet TAKE 1 TABLET BY MOUTH EVERY DAY   escitalopram (LEXAPRO) 20 MG tablet Take 1 tablet by mouth daily.   ezetimibe (ZETIA) 10 MG tablet Take 1 tablet (10 mg total) by mouth daily.   lisinopril (PRINIVIL,ZESTRIL) 10 MG tablet TAKE 1 TABLET (10 MG TOTAL) BY MOUTH DAILY. PLEASE CONTACT OFFICE FOR ADDITIONAL REFILLS 2ND ATTEMPT (Patient taking differently: Take 10 mg by mouth 2 (two) times daily. PLEASE CONTACT OFFICE FOR ADDITIONAL REFILLS 2ND ATTEMPT)   loperamide (IMODIUM) 2 MG capsule Take 2 mg by mouth as needed for diarrhea or loose stools.   traMADol (ULTRAM) 50 MG tablet Take 1-2 tablets (50-100 mg total) by mouth every 6 (six) hours as needed (mild pain).   VYVANSE 40 MG capsule Take 40 mg by mouth every morning.   zolpidem (AMBIEN) 10 MG tablet Take 10 mg by mouth at bedtime as needed. for  sleep   [DISCONTINUED] gemfibrozil (LOPID) 600 MG tablet TAKE 1 TABLET (600 MG TOTAL) BY MOUTH 2 (TWO) TIMES DAILY.     ROS: Pertinent items noted in HPI and remainder of comprehensive ROS otherwise negative.  Labs/Other Tests and Data Reviewed:    Recent Labs: No results found for requested labs within last 8760 hours.   Recent Lipid Panel Lab Results  Component Value Date/Time   CHOL 275 (H) 01/24/2018 11:04 AM   CHOL 184 08/18/2015 11:04 AM   TRIG 175 (H) 01/24/2018 11:04 AM   TRIG 143 08/18/2015 11:04 AM   HDL 36 (L) 01/24/2018 11:04 AM   HDL 28 (L) 08/18/2015 11:04 AM   CHOLHDL 7.6 (H) 01/24/2018 11:04 AM   CHOLHDL 5.5 03/13/2014 11:00 AM   LDLCALC 204 (H) 01/24/2018 11:04 AM   LDLCALC 127 (H) 08/18/2015 11:04 AM    Wt Readings from Last 3 Encounters:  03/28/19 222 lb (100.7 kg)  03/16/18 235 lb (106.6 kg)  01/24/18 235 lb (106.6 kg)     Exam:    Vital Signs:  BP 111/85    Pulse 90    Ht 5\' 9"  (1.753 m)    Wt 222 lb (100.7 kg)    BMI 32.78 kg/m    Exam not performed due to telephone visit  ASSESSMENT & PLAN:    1. Myalgias - ?related to statin/fibrate 2. ED 3. CAD s/p 4 vessel CABG (LIMA to LAD, SVG to OM, and sequential SVG to PDA/PLB) - 02/2014 4. Prior inferior MI - inferior infarct (seen by myoview in 02/2019 - EF 46%, echo with inferior akinesis and EF 40-45% - 01/2019) 5. Hypertension- at goal 6. Probable HeFH (familal hyperlidemia) 7. Obesity 8. Depression 9. History of substance dependence-including Ambien  Mr. Bunte has some symptoms of significant myalgias which is kept him from exercising.  I think this could be related to combination of statin and fibrate.  I recommend discontinuing gemfibrozil as there is no MACE data associated with it.  He is LDL remains well above goal less than 70 on high potency atorvastatin 80 mg and ezetimibe 10 mg daily.  If we extrapolate the benefit of those medications his baseline LDL is likely 250-300.  This is  highly suggestive along with his family history of early onset coronary disease and personal history of coronary disease with early onset of familial hyperlipidemia.  Based on this, he is a good candidate to add PCSK9 inhibitor.  We discussed the use of Repatha today and will pursue the 140  mg every 2 week dosing.  Additionally, he was noted only to be taking metoprolol tartrate once daily.  I advised him that this is not typical for the medication as it is not a long-acting medicine and we will go ahead and switch that to Toprol-XL 25 mg daily.  This should also provide more evidence-based benefit for his cardiomyopathy.  In addition, he is complaining of erectile dysfunction.  He is no longer on nitrate therapy and therefore think he could empirically try Viagra.  We will supply the generic 50 mg Viagra tablets.  In addition, his triglycerides have been elevated and if they are persistently elevated once his LDL is at goal, we would likely consider adding Vascepa given strong evidence from the recent reduce-it trial.  We will repeat a lipid profile in about 4 months and plan follow-up with me in lipid clinic thereafter.  COVID-19 Education: The signs and symptoms of COVID-19 were discussed with the patient and how to seek care for testing (follow up with PCP or arrange E-visit).  The importance of social distancing was discussed today.  Patient Risk:   After full review of this patients clinical status, I feel that they are at least moderate risk at this time.  Time:   Today, I have spent 45 minutes with the patient with telehealth technology discussing dyslipidemia, statin-fibrate concerns, ED, CAD, cardiomyopathy, exercise, blood pressure management, COVID-19 self protection.     Medication Adjustments/Labs and Tests Ordered: Current medicines are reviewed at length with the patient today.  Concerns regarding medicines are outlined above.   Tests Ordered: Orders Placed This Encounter    Procedures   Lipid panel   Direct LDL    Medication Changes: Meds ordered this encounter  Medications   metoprolol succinate (TOPROL XL) 25 MG 24 hr tablet    Sig: Take 1 tablet (25 mg total) by mouth daily.    Dispense:  90 tablet    Refill:  3   sildenafil (VIAGRA) 50 MG tablet    Sig: Take 1 tablet (50 mg total) by mouth daily as needed for erectile dysfunction.    Dispense:  10 tablet    Refill:  0    Disposition:  in 4 month(s)  Pixie Casino, MD, [facility], [place] Director of the Advanced Lipid Disorders &  Cardiovascular Risk Reduction Clinic Diplomate of the American Board of Clinical Lipidology Attending Cardiologist  Direct Dial: [phone]   Fax: [phone]  Website:  [email or web address]  Pixie Casino, MD  03/28/2019 10:10 AM

## 2019-03-30 ENCOUNTER — Other Ambulatory Visit: Payer: Self-pay | Admitting: Internal Medicine

## 2019-04-11 ENCOUNTER — Telehealth: Payer: Self-pay | Admitting: Internal Medicine

## 2019-04-11 NOTE — Telephone Encounter (Signed)
Patient had visit with Dr.Hilty 04/02, last note states-  This is highly suggestive along with his family history of early onset coronary disease and personal history of coronary disease with early onset of familial hyperlipidemia.  Based on this, he is a good candidate to add PCSK9 inhibitor.  We discussed the use of Repatha today and will pursue the 140 mg every 2 week dosing.  I called patient, he did not answer. LVM advising that I would route a message to the PharmD as well as to Dr.Hilty. Gave patient call back number if questions.

## 2019-04-11 NOTE — Telephone Encounter (Signed)
We are working on his prior authorization for [place] today - once it is approved, we will [facility] and notify him.  Thanks.  Dr Lemmie Evens

## 2019-04-11 NOTE — Telephone Encounter (Signed)
New Message  Patient states nurse was suppose to call him to set him up for his injections.  Please give patient a call.

## 2019-04-11 NOTE — Telephone Encounter (Signed)
Called patient, advised of message from MD.  Patient had no questions or concerns at this time.

## 2019-04-15 ENCOUNTER — Telehealth: Payer: Self-pay | Admitting: Internal Medicine

## 2019-04-15 NOTE — Telephone Encounter (Signed)
Will forward to Pharm D for review  

## 2019-04-15 NOTE — Telephone Encounter (Signed)
New Message:   Patient calling cornering some medications to authorize. Please call patient.

## 2019-04-16 MED ORDER — EVOLOCUMAB 140 MG/ML ~~LOC~~ SOAJ: 140.0000 mg | SUBCUTANEOUS | 6 refills | Status: DC

## 2019-04-16 NOTE — Telephone Encounter (Signed)
lmom stating the pt was approved for repatha [facility] and instructed the pt to call me back with cost if unaffordable

## 2019-04-16 NOTE — Addendum Note (Signed)
Addended by: Allean Found on: 04/16/2019 09:44 AM   Modules accepted: Orders

## 2019-04-24 ENCOUNTER — Telehealth: Payer: Self-pay | Admitting: Internal Medicine

## 2019-04-24 NOTE — Telephone Encounter (Signed)
I called and spoke to patient regarding the cost of the repatha and talked him through how to complete the application for the repatha copay card. I also told the pt to call back with anymore issues

## 2019-04-24 NOTE — Telephone Encounter (Signed)
Patient called wanting to speak to nurse about Repatha Dr. Debara Pickett wants to him to start taking.  He stated it's too expensive and is wondering if there is something else he can take.

## 2019-05-01 ENCOUNTER — Other Ambulatory Visit: Payer: Self-pay | Admitting: Internal Medicine

## 2019-05-01 NOTE — Telephone Encounter (Signed)
Chlorthalidone 25 mg refilled.

## 2019-05-13 ENCOUNTER — Other Ambulatory Visit: Payer: Self-pay | Admitting: Internal Medicine

## 2019-05-14 ENCOUNTER — Other Ambulatory Visit: Payer: Self-pay

## 2019-05-14 MED ORDER — EZETIMIBE 10 MG PO TABS: 10.0000 mg | ORAL_TABLET | Freq: Every day | ORAL | 3 refills | Status: DC

## 2019-08-15 ENCOUNTER — Ambulatory Visit: Payer: Self-pay | Admitting: Internal Medicine

## 2019-12-12 ENCOUNTER — Other Ambulatory Visit: Payer: Self-pay | Admitting: Internal Medicine

## 2019-12-30 ENCOUNTER — Telehealth: Payer: Self-pay | Admitting: Internal Medicine

## 2019-12-30 NOTE — Telephone Encounter (Signed)
Attempted to call patient about Jan 09, 2020 visit. This is a VIRTUAL AM clinic only. Patient will need to keep appt or r/s. OK to put him on Jan 13 2:30 or 2:45pm lipid slot

## 2020-01-01 NOTE — Telephone Encounter (Signed)
LMTCB to discuss appointment Appointment type has been changed

## 2020-01-03 NOTE — Telephone Encounter (Signed)
LMTCB concerning appt as noted below

## 2020-01-09 ENCOUNTER — Encounter: Payer: Self-pay | Admitting: Internal Medicine

## 2020-01-09 ENCOUNTER — Telehealth (INDEPENDENT_AMBULATORY_CARE_PROVIDER_SITE_OTHER): Payer: BC Managed Care – PPO | Admitting: Internal Medicine

## 2020-01-09 VITALS — BP 158/115 | HR 86

## 2020-01-09 DIAGNOSIS — I2510 Atherosclerotic heart disease of native coronary artery without angina pectoris: Secondary | ICD-10-CM

## 2020-01-09 DIAGNOSIS — Z79899 Other long term (current) drug therapy: Secondary | ICD-10-CM

## 2020-01-09 DIAGNOSIS — Z951 Presence of aortocoronary bypass graft: Secondary | ICD-10-CM | POA: Diagnosis not present

## 2020-01-09 DIAGNOSIS — E7849 Other hyperlipidemia: Secondary | ICD-10-CM | POA: Diagnosis not present

## 2020-01-09 DIAGNOSIS — I5022 Chronic systolic (congestive) heart failure: Secondary | ICD-10-CM

## 2020-01-09 DIAGNOSIS — I255 Ischemic cardiomyopathy: Secondary | ICD-10-CM | POA: Insufficient documentation

## 2020-01-09 MED ORDER — ENTRESTO 49-51 MG PO TABS: 1.0000 | ORAL_TABLET | Freq: Two times a day (BID) | ORAL | 11 refills | Status: DC

## 2020-01-09 NOTE — Telephone Encounter (Signed)
Left detailed message with visit instructions on name-verified VM. Sent info via [employer]. Will mail AVS & lab orders

## 2020-01-09 NOTE — Patient Instructions (Signed)
Medication Instructions:  STOP lisinopril START entresto 49/51mg  twice daily after you have been off lisinopril for 48 hours Continue other current medications *If you need a refill on your cardiac medications before your next appointment, please call your pharmacy*  Lab Work: FASTING lab work in 2 weeks - lipid panel, direct LDL, BNP, CMET The lab work can be done at [facility] site, including the one in Dr. Lysbeth Penner office that is open M-F from 8-4:30  If you have labs (blood work) drawn today and your tests are completely normal, you will receive your results only by: Marland Kitchen MyChart Message (if you have MyChart) OR . A paper copy in the mail If you have any lab test that is abnormal or we need to change your treatment, we will call you to review the results.  Testing/Procedures: NONE  Follow-Up: At [facility], you and your health needs are our priority.  As part of our continuing mission to provide you with exceptional heart care, we have created designated Provider Care Teams.  These Care Teams include your Primary Cardiologist (physician) and Advanced Practice Providers (APPs -  Physician Assistants and Nurse Practitioners) who all work together to provide you with the care you need, when you need it.  Your next appointment:   1 month(s)  The format for your next appointment:   In Person  Provider:   K. Mali Hilty, MD  Other Instructions

## 2020-01-09 NOTE — Progress Notes (Signed)
Virtual Visit via Telephone Note    Evaluation Performed:  Routine follow-up  This visit type was conducted due to national recommendations for restrictions regarding the COVID-19 Pandemic (e.g. social distancing).  This format is felt to be most appropriate for this patient at this time.  All issues noted in this document were discussed and addressed.  No physical exam was performed (except for noted visual exam findings with Video Visits).  Please refer to the patient's chart (MyChart message for video visits and phone note for telephone visits) for the patient's consent to telehealth for [facility].  Date:  01/09/2020   ID:  Kenneth [name], DOB Apr 27, 1966, MRN [id number]  Patient Location:  [place] [address]  Provider location:   [address] [place], [place] [address]  PCP:  Curly Rim, MD  Cardiologist:  No primary care provider on file. Electrophysiologist:  None   Chief Complaint:  Muscle aches, burning  History of Present Illness:    Kenneth [name] is a 54 y.o. male who presents via audio/video conferencing for a telehealth visit today.  Kenneth [name] is a longtime patient of mine with a history of coronary artery disease and most likely familial hyperlipidemia.  He had early onset heart disease and was initially diagnosed with variant angina in [place] and treated aggressively with high-dose nitrates and ranolazine initially after cath apparently demonstrated no significant coronary disease.  Subsequently after failing medical therapy underwent repeat cath here which demonstrated severe multivessel coronary disease in 2015.  He then underwent four-vessel CABG with LIMA to LAD, SVG to OM and sequential SVG to RPDA and RPL.  LVEF at the time was 41% with an inferior defect.  Cath had shown chronic total occlusion of the right coronary artery with left-to-right collaterals.  Subsequently there was some mild improvement in LV function up to  50 to 55% however in January 2019 at the time when he was having increased blood pressures and chest discomfort, he underwent repeat stress testing and an echocardiogram.  The echo showed an EF of 40 to 45% with inferior akinesis.  A stress test then showed an EF of 46% with a fixed inferior defect suggestive of scar, with no reversible ischemia.  After medication adjustment his blood pressures improved.  Over the past year he is done fairly well, but has a number of concerns today.  He has reported inability to exercise due to significant muscle fatiguing and a feeling of lactic acidosis.  Previously was more active in fact was heavily involved in the martial arts.  In reviewing his medications I can see that he is on a combination of atorvastatin and gemfibrozil as well as ezetimibe.  The statin and gemfibrozil combination however may increase the risk of myopathy.  Additionally, I advised him that there is no significant mortality benefit or decrease in cardiovascular events with gemfibrozil.  Most recently lab work 2 weeks ago indicated his total cholesterol was 204, triglycerides 150, HDL 33 and LDL 141.  Again this suggest without treatment his LDL would be closer to 280, most suggestive of familial hyperlipidemia.  He also mentions that for some reason he has been taking metoprolol tartrate only once daily at night.  He has concerns about erectile dysfunction and is asking about possible treatments for that.  He had only once taken Viagra before but had a headache without much improvement.  However at the time, he was on high-dose nitrates for chest pain and he was not  prescribed a Viagra, rather he took a pill from 1 of his friends, not realizing that Viagra should not be used with nitrates.  Fortunately he is no longer on nitrates.  He denies any chest pain.  He does get some shortness of breath with exertion.  He denies any orthopnea, PND, lower extremity swelling or other associated  symptoms.  01/09/2020  Kenneth [name] returns today for follow-up.  I last saw him via virtual visit in April 2020 during the pandemic.  He had some improvement in blood pressures after changing his medicines however recently his blood pressure has continued to rise.  I was concerned about his lipid profile which suggested familial hyperlipidemia, especially given his extensive coronary disease.  He had been started on Repatha and continues to use it without difficulty.  He is not really had any follow-up lab work.  His last stress test and echo as mentioned above showed an EF in the mid 40s.  There is lateral wall infarct.  He does report that he gets fatigue and occasional shortness of breath.  He is also been anxious and having some panic attacks.  He is try to come off of his ADHD medicine to see if that helps.  He is seeing a psychiatrist.  The patient does not have symptoms concerning for COVID-19 infection (fever, chills, cough, or new SHORTNESS OF BREATH).    Prior CV studies:   The following studies were reviewed today:  Stress test (02/2018) Echocardiogram (01/2018)  PMHx:  Past Medical History:  Diagnosis Date  . Arthritis   . CAD (coronary artery disease)    a. 02/2014 Abnl MV with defect in LCX territory;  b. 02/2014 Cath: LAD 60-70p, 47m, LCX 100 CTO, OM1 95p, 99d, RCA 100 CTO w/ L->R collaterals; c. 02/2014 CABG x 4 (LIMA->LAD, VG->OM, VG->RPDA->RPL); d. 01/2016 MV: 41%, med size, sev intensity fixed basal inferior lateral defect w/o ischemia.  . Carotid arterial disease ([place])    a. 06/2013 Carotid U/S: moderate narrowing of both subclavian arteries, with normal carotid arteries;  b. 02/2014 Carotid U/S: 1-39% bilat ICA stenosis.  Marland Kitchen History of echocardiogram    a. 02/2016 Echo: EF 50-55%, no rwma, mildly dil LA, nl RV fxn, mildly dil RA.  Marland Kitchen Hyperlipidemia   . Hypertensive heart disease   . Syndrome X, cardiac ([place])    a. Previously felt to have microvascular angina following remote  cath in [place]-->treated with nitrates, ranexa, EECP.  Later found to have multivessel CAD.    Past Surgical History:  Procedure Laterality Date  . CARDIAC CATHETERIZATION  2005  . CORONARY ARTERY BYPASS GRAFT N/A 03/18/2014   Procedure: CORONARY ARTERY BYPASS GRAFTING (CABG) x 4 using endoscopically harvested right saphenous vein and left internal mammary artery;  Surgeon: Gaye Pollack, MD;  Location: [facility] OR;  Service: Open Heart Surgery;  Laterality: N/A;  . HAND SURGERY Left   . INTRAOPERATIVE TRANSESOPHAGEAL ECHOCARDIOGRAM N/A 03/18/2014   Procedure: INTRAOPERATIVE TRANSESOPHAGEAL ECHOCARDIOGRAM;  Surgeon: Gaye Pollack, MD;  Location: [facility] OR;  Service: Open Heart Surgery;  Laterality: N/A;  . KIDNEY STONE SURGERY  age 6  . LEFT HEART CATHETERIZATION WITH CORONARY ANGIOGRAM N/A 03/14/2014   Procedure: LEFT HEART CATHETERIZATION WITH CORONARY ANGIOGRAM;  Surgeon: Troy Sine, MD;  Location: [facility] CATH LAB;  Service: Cardiovascular;  Laterality: N/A;  . SHOULDER ARTHROSCOPY  2001   L shoulder  . TOTAL HIP ARTHROPLASTY Left 10/26/2016   Procedure: LEFT TOTAL HIP ARTHROPLASTY ANTERIOR APPROACH;  Surgeon: Pilar Plate  [name], MD;  Location: [facility] ORS;  Service: Orthopedics;  Laterality: Left;    FAMHx:  Family History  Problem Relation Age of Onset  . Heart attack Mother   . Stroke Mother   . Diabetes Mother   . Heart attack Father   . Cancer Father        lung    SOCHx:   reports that he quit smoking about 7 years ago. His smoking use included cigars. He has never used smokeless tobacco. He reports that he does not drink alcohol or use drugs.  ALLERGIES:  Allergies  Allergen Reactions  . Tetracyclines & Related Nausea And Vomiting    MEDS:  Current Meds  Medication Sig  . atorvastatin (LIPITOR) 80 MG tablet TAKE 1 TABLET (80 MG TOTAL) BY MOUTH AT BEDTIME.  . [employer] ASPIRIN EC 325 MG EC tablet TAKE 1 TABLET BY MOUTH EVERY DAY  . diclofenac (VOLTAREN) 75 MG EC tablet Take 75 mg by  mouth daily.  . DULoxetine (CYMBALTA) 60 MG capsule Take 1 capsule by mouth daily.  Marland Kitchen ezetimibe (ZETIA) 10 MG tablet Take 1 tablet (10 mg total) by mouth daily.  . hydrochlorothiazide (HYDRODIURIL) 25 MG tablet Take 1 tablet by mouth daily.  Marland Kitchen lisinopril (ZESTRIL) 20 MG tablet Take 20 mg by mouth 2 (two) times daily.  Marland Kitchen loperamide (IMODIUM) 2 MG capsule Take 2 mg by mouth as needed for diarrhea or loose stools.  . metoprolol succinate (TOPROL XL) 25 MG 24 hr tablet Take 1 tablet (25 mg total) by mouth daily.  Marland Kitchen REPATHA SURECLICK XX123456 MG/ML SOAJ INJECT 140 MG INTO THE SKIN EVERY 14 (FOURTEEN) DAYS.  Marland Kitchen sildenafil (VIAGRA) 50 MG tablet Take 1 tablet (50 mg total) by mouth daily as needed for erectile dysfunction.  Marland Kitchen testosterone cypionate (DEPOTESTOSTERONE CYPIONATE) 200 MG/ML injection once a week.  . traMADol (ULTRAM) 50 MG tablet Take 1-2 tablets (50-100 mg total) by mouth every 6 (six) hours as needed (mild pain).  Marland Kitchen zolpidem (AMBIEN) 10 MG tablet Take 10 mg by mouth at bedtime as needed. for sleep  . [DISCONTINUED] chlorthalidone (HYGROTON) 25 MG tablet Take 0.5 tablets (12.5 mg total) by mouth daily. (Patient taking differently: Take 25 mg by mouth daily. )  . [DISCONTINUED] lisinopril (PRINIVIL,ZESTRIL) 10 MG tablet Take 1 tablet (10 mg total) by mouth daily.     ROS: Pertinent items noted in HPI and remainder of comprehensive ROS otherwise negative.  Labs/Other Tests and Data Reviewed:    Recent Labs: No results found for requested labs within last 8760 hours.   Recent Lipid Panel Lab Results  Component Value Date/Time   CHOL 275 (H) 01/24/2018 11:04 AM   CHOL 184 08/18/2015 11:04 AM   TRIG 175 (H) 01/24/2018 11:04 AM   TRIG 143 08/18/2015 11:04 AM   HDL 36 (L) 01/24/2018 11:04 AM   HDL 28 (L) 08/18/2015 11:04 AM   CHOLHDL 7.6 (H) 01/24/2018 11:04 AM   CHOLHDL 5.5 03/13/2014 11:00 AM   LDLCALC 204 (H) 01/24/2018 11:04 AM   LDLCALC 127 (H) 08/18/2015 11:04 AM    Wt Readings  from Last 3 Encounters:  03/28/19 222 lb (100.7 kg)  03/16/18 235 lb (106.6 kg)  01/24/18 235 lb (106.6 kg)     Exam:    Vital Signs:  BP (!) 158/115   Pulse 86   SpO2 98%    General appearance: alert and no distress Lungs: No visual respiratory difficulty Extremities: extremities normal, atraumatic, no cyanosis or edema Skin:  Skin color, texture, turgor normal. No rashes or lesions Neurologic: Mental status: Alert, oriented, thought content appropriate Psych: Pleasant  ASSESSMENT & PLAN:    1. Ischemic cardiomyopathy, chronic systolic heart failure, NYHA class II symptoms 2. Myalgias - ?related to statin/fibrate 3. ED 4. CAD s/p 4 vessel CABG (LIMA to LAD, SVG to OM, and sequential SVG to PDA/PLB) - 02/2014 5. Prior inferior MI - inferior infarct (seen by myoview in 02/2019 - EF 46%, echo with inferior akinesis and EF 40-45% - 01/2019) 6. Hypertension-uncontrolled 7. Probable HeFH (familal hyperlidemia) 8. Obesity 9. Depression 10. History of substance dependence-including Ambien  Kenneth [name] has an ischemic cardiomyopathy with recent fatigue and shortness of breath as well as poorly controlled hypertension.  I think he benefit from adjustment in his heart failure medicine and will discontinue lisinopril, washout the medicine for 48 hours and then start him on Entresto 49/51 mg twice daily.  We will plan likely to see him back in a month to uptitrate the dose.  He will have lab work in about 2 weeks to monitor renal function, check BNP is well as a fasting lipid and direct LDL.  He has been on Repatha without recent reassessment of his lipids.  COVID-19 Education: The signs and symptoms of COVID-19 were discussed with the patient and how to seek care for testing (follow up with PCP or arrange E-visit).  The importance of social distancing was discussed today.  Patient Risk:   After full review of this patients clinical status, I feel that they are at least moderate risk at this  time.  Time:   Today, I have spent 25 minutes with the patient with telehealth technology discussing dyslipidemia, statin-fibrate concerns, ED, CAD, cardiomyopathy, exercise, blood pressure management, COVID-19 self protection.     Medication Adjustments/Labs and Tests Ordered: Current medicines are reviewed at length with the patient today.  Concerns regarding medicines are outlined above.   Tests Ordered: No orders of the defined types were placed in this encounter.   Medication Changes: No orders of the defined types were placed in this encounter.   Disposition:  in 1 month(s)  Pixie Casino, MD, [facility], [place] Director of the Advanced Lipid Disorders &  Cardiovascular Risk Reduction Clinic Diplomate of the American Board of Clinical Lipidology Attending Cardiologist  Direct Dial: [phone]  Fax: [phone]  Website:  [email or web address]  Pixie Casino, MD  01/09/2020 9:52 AM

## 2020-01-21 ENCOUNTER — Other Ambulatory Visit: Payer: Self-pay | Admitting: Internal Medicine

## 2020-01-27 NOTE — Telephone Encounter (Signed)
Left message to follow up on last visit instructions, as labs have not been completed and there is an outstanding PA request for Repatha

## 2020-03-14 ENCOUNTER — Other Ambulatory Visit: Payer: Self-pay | Admitting: Internal Medicine

## 2020-03-20 ENCOUNTER — Other Ambulatory Visit: Payer: Self-pay

## 2020-03-20 ENCOUNTER — Emergency Department (HOSPITAL_COMMUNITY)
Admission: EM | Admit: 2020-03-20 | Discharge: 2020-03-20 | Disposition: A | Discharge status: Home / Self Care | Payer: No Typology Code available for payment source | Attending: Emergency Medicine | Admitting: Emergency Medicine

## 2020-03-20 ENCOUNTER — Encounter (HOSPITAL_COMMUNITY): Payer: Self-pay | Admitting: *Deleted

## 2020-03-20 ENCOUNTER — Emergency Department (HOSPITAL_COMMUNITY): Payer: No Typology Code available for payment source

## 2020-03-20 DIAGNOSIS — Y92410 Unspecified street and highway as the place of occurrence of the external cause: Secondary | ICD-10-CM | POA: Insufficient documentation

## 2020-03-20 DIAGNOSIS — R0789 Other chest pain: Secondary | ICD-10-CM | POA: Diagnosis not present

## 2020-03-20 DIAGNOSIS — S0285XA Fracture of orbit, unspecified, initial encounter for closed fracture: Secondary | ICD-10-CM

## 2020-03-20 DIAGNOSIS — S0181XA Laceration without foreign body of other part of head, initial encounter: Secondary | ICD-10-CM

## 2020-03-20 DIAGNOSIS — Y999 Unspecified external cause status: Secondary | ICD-10-CM | POA: Diagnosis not present

## 2020-03-20 DIAGNOSIS — Z20822 Contact with and (suspected) exposure to covid-19: Secondary | ICD-10-CM | POA: Diagnosis not present

## 2020-03-20 DIAGNOSIS — S0240CA Maxillary fracture, right side, initial encounter for closed fracture: Secondary | ICD-10-CM | POA: Diagnosis not present

## 2020-03-20 DIAGNOSIS — Y939 Activity, unspecified: Secondary | ICD-10-CM | POA: Diagnosis not present

## 2020-03-20 DIAGNOSIS — Z23 Encounter for immunization: Secondary | ICD-10-CM | POA: Diagnosis not present

## 2020-03-20 DIAGNOSIS — S022XXA Fracture of nasal bones, initial encounter for closed fracture: Secondary | ICD-10-CM

## 2020-03-20 DIAGNOSIS — R109 Unspecified abdominal pain: Secondary | ICD-10-CM | POA: Insufficient documentation

## 2020-03-20 DIAGNOSIS — S0990XA Unspecified injury of head, initial encounter: Secondary | ICD-10-CM | POA: Diagnosis present

## 2020-03-20 DIAGNOSIS — S060X1A Concussion with loss of consciousness of 30 minutes or less, initial encounter: Secondary | ICD-10-CM | POA: Diagnosis not present

## 2020-03-20 DIAGNOSIS — T1490XA Injury, unspecified, initial encounter: Secondary | ICD-10-CM

## 2020-03-20 LAB — COMPREHENSIVE METABOLIC PANEL
ALT: 34 U/L (ref 0–44)
AST: 32 U/L (ref 15–41)
Albumin: 3.8 g/dL (ref 3.5–5.0)
Alkaline Phosphatase: 59 U/L (ref 38–126)
Anion gap: 10 (ref 5–15)
BUN: 14 mg/dL (ref 6–20)
CO2: 29 mmol/L (ref 22–32)
Calcium: 9.5 mg/dL (ref 8.9–10.3)
Chloride: 97 mmol/L — ABNORMAL LOW (ref 98–111)
Creatinine, Ser: 1.5 mg/dL — ABNORMAL HIGH (ref 0.61–1.24)
GFR calc Af Amer: 60 mL/min — ABNORMAL LOW (ref >60)
GFR calc non Af Amer: 52 mL/min — ABNORMAL LOW (ref >60)
Glucose, Bld: 117 mg/dL — ABNORMAL HIGH (ref 70–99)
Potassium: 3.5 mmol/L (ref 3.5–5.1)
Sodium: 136 mmol/L (ref 135–145)
Total Bilirubin: 0.9 mg/dL (ref 0.3–1.2)
Total Protein: 7.3 g/dL (ref 6.5–8.1)

## 2020-03-20 LAB — URINALYSIS, ROUTINE W REFLEX MICROSCOPIC
Bacteria, UA: NONE SEEN
Bilirubin Urine: NEGATIVE
Glucose, UA: NEGATIVE mg/dL
Ketones, ur: NEGATIVE mg/dL
Leukocytes,Ua: NEGATIVE
Nitrite: NEGATIVE
Protein, ur: 30 mg/dL — AB
Specific Gravity, Urine: 1.035 — ABNORMAL HIGH (ref 1.005–1.030)
pH: 5 (ref 5.0–8.0)

## 2020-03-20 LAB — CBC
HCT: 49.4 % (ref 39.0–52.0)
Hemoglobin: 16.8 g/dL (ref 13.0–17.0)
MCH: 28.1 pg (ref 26.0–34.0)
MCHC: 34 g/dL (ref 30.0–36.0)
MCV: 82.6 fL (ref 80.0–100.0)
Platelets: 328 10*3/uL (ref 150–400)
RBC: 5.98 MIL/uL — ABNORMAL HIGH (ref 4.22–5.81)
RDW: 13.4 % (ref 11.5–15.5)
WBC: 13.1 10*3/uL — ABNORMAL HIGH (ref 4.0–10.5)
nRBC: 0 % (ref 0.0–0.2)

## 2020-03-20 LAB — I-STAT CHEM 8, ED
BUN: 16 mg/dL (ref 6–20)
Calcium, Ion: 1.17 mmol/L (ref 1.15–1.40)
Chloride: 95 mmol/L — ABNORMAL LOW (ref 98–111)
Creatinine, Ser: 1.5 mg/dL — ABNORMAL HIGH (ref 0.61–1.24)
Glucose, Bld: 112 mg/dL — ABNORMAL HIGH (ref 70–99)
HCT: 50 % (ref 39.0–52.0)
Hemoglobin: 17 g/dL (ref 13.0–17.0)
Potassium: 3.4 mmol/L — ABNORMAL LOW (ref 3.5–5.1)
Sodium: 136 mmol/L (ref 135–145)
TCO2: 29 mmol/L (ref 22–32)

## 2020-03-20 LAB — PROTIME-INR
INR: 1 (ref 0.8–1.2)
Prothrombin Time: 12.9 seconds (ref 11.4–15.2)

## 2020-03-20 LAB — SAMPLE TO BLOOD BANK

## 2020-03-20 LAB — RESPIRATORY PANEL BY RT PCR (FLU A&B, COVID)
Influenza A by PCR: NEGATIVE
Influenza B by PCR: NEGATIVE
SARS Coronavirus 2 by RT PCR: NEGATIVE

## 2020-03-20 LAB — LACTIC ACID, PLASMA: Lactic Acid, Venous: 1.9 mmol/L (ref 0.5–1.9)

## 2020-03-20 LAB — ETHANOL: Alcohol, Ethyl (B): <10 mg/dL (ref <10)

## 2020-03-20 MED ORDER — TETANUS-DIPHTH-ACELL PERTUSSIS 5-2.5-18.5 LF-MCG/0.5 IM SUSP: 0.5000 mL | Freq: Once | INTRAMUSCULAR | Status: DC

## 2020-03-20 MED ORDER — FENTANYL CITRATE (PF) 100 MCG/2ML IJ SOLN
50.0000 ug | Freq: Once | INTRAMUSCULAR | Status: AC
  Administered 2020-03-20: 50 ug via INTRAVENOUS
  Filled 2020-03-20: qty 2

## 2020-03-20 MED ORDER — LIDOCAINE HCL (PF) 1 % IJ SOLN
30.0000 mL | Freq: Once | INTRAMUSCULAR | Status: AC
  Administered 2020-03-20: 30 mL
  Filled 2020-03-20: qty 30

## 2020-03-20 MED ORDER — CEPHALEXIN 500 MG PO CAPS: 500.0000 mg | ORAL_CAPSULE | Freq: Two times a day (BID) | ORAL | 0 refills | Status: DC

## 2020-03-20 MED ORDER — TETANUS-DIPHTH-ACELL PERTUSSIS 5-2.5-18.5 LF-MCG/0.5 IM SUSP
0.5000 mL | Freq: Once | INTRAMUSCULAR | Status: AC
  Administered 2020-03-20: 0.5 mL via INTRAMUSCULAR
  Filled 2020-03-20: qty 0.5

## 2020-03-20 MED ORDER — FENTANYL CITRATE (PF) 100 MCG/2ML IJ SOLN
100.0000 ug | Freq: Once | INTRAMUSCULAR | Status: AC
  Administered 2020-03-20: 100 ug via INTRAVENOUS
  Filled 2020-03-20: qty 2

## 2020-03-20 MED ORDER — LIDOCAINE-EPINEPHRINE-TETRACAINE (LET) TOPICAL GEL
3.0000 mL | Freq: Once | TOPICAL | Status: AC
  Administered 2020-03-20: 3 mL via TOPICAL
  Filled 2020-03-20: qty 3

## 2020-03-20 MED ORDER — HYDROCODONE-ACETAMINOPHEN 5-325 MG PO TABS: 1.0000 | ORAL_TABLET | Freq: Four times a day (QID) | ORAL | 0 refills | Status: DC | PRN

## 2020-03-20 MED ORDER — IOHEXOL 300 MG/ML  SOLN
100.0000 mL | Freq: Once | INTRAMUSCULAR | Status: AC | PRN
  Administered 2020-03-20: 100 mL via INTRAVENOUS

## 2020-03-20 MED ORDER — ONDANSETRON 8 MG PO TBDP: 8.0000 mg | ORAL_TABLET | Freq: Three times a day (TID) | ORAL | 0 refills | Status: DC | PRN
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
## 2020-03-20 NOTE — ED Notes (Signed)
Ambulated pt in room. Pt was able to ambulate independently with no dizziness or light headedness. Pt was steady on feet.
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
## 2020-03-20 NOTE — ED Notes (Signed)
Cleaned pt's face from dry blood and extremities. No injuries noted on extremities. Laceration to right side of nose and blood draining from right eye.
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
## 2020-03-20 NOTE — ED Notes (Signed)
Pt transported to CT ?

## 2020-03-20 NOTE — ED Provider Notes (Signed)
LACERATION REPAIR Performed by: Antonietta Breach Authorized by: Antonietta Breach Consent: Verbal consent obtained. Risks and benefits: risks, benefits and alternatives were discussed Consent given by: patient Patient identity confirmed: provided demographic data Prepped and Draped in normal sterile fashion Wound explored  Laceration Location: lower right eyelid  Laceration Length: 1.5cm  No Foreign Bodies seen or palpated  Anesthesia: local infiltration  Local anesthetic: topical LET  Anesthetic total: 3 ml  Irrigation method: syringe Amount of cleaning: standard  Skin closure: 6-0 prolene  Number of sutures: 1  Technique: simple interrupted  Patient tolerance: Patient tolerated the procedure well with no immediate complications.    Antonietta Breach, PA-C 03/20/20 [id number]    Ripley Fraise, MD 03/20/20 [phone]

## 2020-03-20 NOTE — ED Notes (Signed)
Discharge instructions discussed with pt and wife at bedside. Pt and wife verbalized understanding with no questions at this time. Pt to follow up with surgery and opthalmology   Paper scrubs given to pt. Belongings including: keys wallet, and glasses given to wife. Pt ambulatory.

## 2020-03-20 NOTE — ED Notes (Signed)
C-collar removed by EDP.   

## 2020-03-20 NOTE — ED Notes (Signed)
Attempted to call pt's spouse per pt request no answer at this time

## 2020-03-23 ENCOUNTER — Encounter: Payer: Self-pay | Admitting: Internal Medicine
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
## 2020-03-24 ENCOUNTER — Telehealth: Payer: Self-pay

## 2020-03-24 NOTE — Telephone Encounter (Signed)
Patient walked into clinic this morning with questions about medications. Informed patient a nurse would call back this afternoon. Left message for patient to call back.

## 2020-03-25 NOTE — Telephone Encounter (Signed)
Patient returning call.

## 2020-03-25 NOTE — Telephone Encounter (Signed)
Patient returning call. Transferred call to the nurse. 
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
## 2020-03-25 NOTE — Telephone Encounter (Signed)
Left message for patient to call back  

## 2020-03-26 ENCOUNTER — Other Ambulatory Visit: Payer: Self-pay

## 2020-03-26 ENCOUNTER — Other Ambulatory Visit (HOSPITAL_COMMUNITY)
Admission: RE | Admit: 2020-03-26 | Discharge: 2020-03-26 | Disposition: A | Discharge status: Home / Self Care | Payer: No Typology Code available for payment source | Source: Ambulatory Visit | Attending: Plastic Surgery | Admitting: Plastic Surgery

## 2020-03-26 ENCOUNTER — Encounter (HOSPITAL_COMMUNITY): Payer: Self-pay | Admitting: Plastic Surgery

## 2020-03-26 DIAGNOSIS — Z20822 Contact with and (suspected) exposure to covid-19: Secondary | ICD-10-CM | POA: Diagnosis not present

## 2020-03-26 DIAGNOSIS — Z01812 Encounter for preprocedural laboratory examination: Secondary | ICD-10-CM | POA: Insufficient documentation

## 2020-03-26 LAB — SARS CORONAVIRUS 2 (TAT 6-24 HRS): SARS Coronavirus 2: NEGATIVE
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
## 2020-03-26 NOTE — Progress Notes (Signed)
Pt denies SOB and chest pain. Pt stated that he is under the care of Dr. Debara Pickett, Cardiology and Dr. Ledora Bottcher, PCP. Pt made aware to stop taking vitamins, fish oil and herbal medications. Do not take any NSAIDs ie: Voltaren, Ibuprofen, Advil, Naproxen (Aleve), Motrin, BC and Goody Powder. Pt reminded to quarantine. Pt verbalized understanding of all pre-op instructions. See PA, Anesthesiology, note.
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
## 2020-03-27 ENCOUNTER — Encounter (HOSPITAL_COMMUNITY)
Admission: RE | Disposition: A | Discharge status: Home / Self Care | Payer: Self-pay | Source: Home / Self Care | Attending: Plastic Surgery

## 2020-03-27 ENCOUNTER — Other Ambulatory Visit: Payer: Self-pay

## 2020-03-27 ENCOUNTER — Ambulatory Visit (HOSPITAL_COMMUNITY): Payer: Self-pay | Admitting: Physician Assistant

## 2020-03-27 ENCOUNTER — Ambulatory Visit (HOSPITAL_COMMUNITY)
Admission: RE | Admit: 2020-03-27 | Discharge: 2020-03-27 | Disposition: A | Discharge status: Home / Self Care | Payer: Self-pay | Attending: Plastic Surgery | Admitting: Plastic Surgery

## 2020-03-27 ENCOUNTER — Encounter (HOSPITAL_COMMUNITY): Payer: Self-pay | Admitting: Plastic Surgery

## 2020-03-27 DIAGNOSIS — I10 Essential (primary) hypertension: Secondary | ICD-10-CM | POA: Insufficient documentation

## 2020-03-27 DIAGNOSIS — Z6834 Body mass index (BMI) 34.0-34.9, adult: Secondary | ICD-10-CM | POA: Insufficient documentation

## 2020-03-27 DIAGNOSIS — Z87891 Personal history of nicotine dependence: Secondary | ICD-10-CM | POA: Insufficient documentation

## 2020-03-27 DIAGNOSIS — Z951 Presence of aortocoronary bypass graft: Secondary | ICD-10-CM | POA: Insufficient documentation

## 2020-03-27 DIAGNOSIS — S022XXA Fracture of nasal bones, initial encounter for closed fracture: Secondary | ICD-10-CM | POA: Insufficient documentation

## 2020-03-27 DIAGNOSIS — E669 Obesity, unspecified: Secondary | ICD-10-CM | POA: Insufficient documentation

## 2020-03-27 DIAGNOSIS — I2510 Atherosclerotic heart disease of native coronary artery without angina pectoris: Secondary | ICD-10-CM | POA: Insufficient documentation

## 2020-03-27 DIAGNOSIS — F329 Major depressive disorder, single episode, unspecified: Secondary | ICD-10-CM | POA: Insufficient documentation

## 2020-03-27 DIAGNOSIS — S0231XA Fracture of orbital floor, right side, initial encounter for closed fracture: Secondary | ICD-10-CM | POA: Insufficient documentation

## 2020-03-27 DIAGNOSIS — S01111A Laceration without foreign body of right eyelid and periocular area, initial encounter: Secondary | ICD-10-CM | POA: Insufficient documentation

## 2020-03-27 HISTORY — DX: Presence of spectacles and contact lenses: Z97.3

## 2020-03-27 HISTORY — DX: Personal history of urinary calculi: Z87.442

## 2020-03-27 HISTORY — PX: CLOSED REDUCTION NASAL FRACTURE: SHX5365

## 2020-03-27 HISTORY — DX: Other injury of unspecified body region, initial encounter: T14.8XXA

## 2020-03-27 HISTORY — DX: Depression, unspecified: F32.A

## 2020-03-27 HISTORY — DX: Other specified postprocedural states: Z98.890

## 2020-03-27 HISTORY — DX: Nausea with vomiting, unspecified: R11.2

## 2020-03-27 SURGERY — CLOSED REDUCTION, FRACTURE, NASAL BONE
Anesthesia: General | Site: Nose

## 2020-03-27 MED ORDER — ONDANSETRON HCL 4 MG/2ML IJ SOLN: 4.0000 mg | Freq: Once | INTRAMUSCULAR | Status: DC | PRN

## 2020-03-27 MED ORDER — FENTANYL CITRATE (PF) 100 MCG/2ML IJ SOLN
INTRAMUSCULAR | Status: AC
  Filled 2020-03-27: qty 2

## 2020-03-27 MED ORDER — SUGAMMADEX SODIUM 200 MG/2ML IV SOLN
INTRAVENOUS | Status: DC | PRN
  Administered 2020-03-27: 200 mg via INTRAVENOUS

## 2020-03-27 MED ORDER — FENTANYL CITRATE (PF) 250 MCG/5ML IJ SOLN
INTRAMUSCULAR | Status: AC
  Filled 2020-03-27: qty 5

## 2020-03-27 MED ORDER — LACTATED RINGERS IV SOLN: INTRAVENOUS | Status: DC

## 2020-03-27 MED ORDER — HYDROMORPHONE HCL 1 MG/ML IJ SOLN
0.2500 mg | INTRAMUSCULAR | Status: DC | PRN
  Administered 2020-03-27 (×2): 0.5 mg via INTRAVENOUS

## 2020-03-27 MED ORDER — LIDOCAINE 2% (20 MG/ML) 5 ML SYRINGE
INTRAMUSCULAR | Status: DC | PRN
  Administered 2020-03-27: 100 mg via INTRAVENOUS

## 2020-03-27 MED ORDER — HYDROMORPHONE HCL 1 MG/ML IJ SOLN
INTRAMUSCULAR | Status: AC
  Administered 2020-03-27: 0.5 mg via INTRAVENOUS
  Filled 2020-03-27: qty 1

## 2020-03-27 MED ORDER — ACETAMINOPHEN 325 MG PO TABS: 325.0000 mg | ORAL_TABLET | ORAL | Status: DC | PRN

## 2020-03-27 MED ORDER — SUCCINYLCHOLINE CHLORIDE 20 MG/ML IJ SOLN
INTRAMUSCULAR | Status: DC | PRN
  Administered 2020-03-27: 140 mg via INTRAVENOUS

## 2020-03-27 MED ORDER — MIDAZOLAM HCL 2 MG/2ML IJ SOLN
INTRAMUSCULAR | Status: AC
  Filled 2020-03-27: qty 2

## 2020-03-27 MED ORDER — FENTANYL CITRATE (PF) 100 MCG/2ML IJ SOLN
25.0000 ug | INTRAMUSCULAR | Status: DC | PRN
  Administered 2020-03-27 (×2): 50 ug via INTRAVENOUS

## 2020-03-27 MED ORDER — ACETAMINOPHEN 160 MG/5ML PO SOLN: 325.0000 mg | ORAL | Status: DC | PRN

## 2020-03-27 MED ORDER — BACITRACIN-NEOMYCIN-POLYMYXIN OINTMENT TUBE
TOPICAL_OINTMENT | CUTANEOUS | Status: AC
  Filled 2020-03-27: qty 14.17

## 2020-03-27 MED ORDER — 0.9 % SODIUM CHLORIDE (POUR BTL) OPTIME
TOPICAL | Status: DC | PRN
  Administered 2020-03-27: 1000 mL

## 2020-03-27 MED ORDER — OXYMETAZOLINE HCL 0.05 % NA SOLN
NASAL | Status: DC | PRN
  Administered 2020-03-27: 1

## 2020-03-27 MED ORDER — PROPOFOL 10 MG/ML IV BOLUS
INTRAVENOUS | Status: DC | PRN
  Administered 2020-03-27: 50 mg via INTRAVENOUS
  Administered 2020-03-27: 150 mg via INTRAVENOUS

## 2020-03-27 MED ORDER — BACITRACIN-NEOMYCIN-POLYMYXIN 400-5-5000 EX OINT
TOPICAL_OINTMENT | CUTANEOUS | Status: DC | PRN
  Administered 2020-03-27: 1 via TOPICAL

## 2020-03-27 MED ORDER — KETOROLAC TROMETHAMINE 30 MG/ML IJ SOLN
INTRAMUSCULAR | Status: AC
  Filled 2020-03-27: qty 1

## 2020-03-27 MED ORDER — ROCURONIUM BROMIDE 100 MG/10ML IV SOLN
INTRAVENOUS | Status: DC | PRN
  Administered 2020-03-27: 10 mg via INTRAVENOUS

## 2020-03-27 MED ORDER — MIDAZOLAM HCL 5 MG/5ML IJ SOLN
INTRAMUSCULAR | Status: DC | PRN
  Administered 2020-03-27: 1 mg via INTRAVENOUS

## 2020-03-27 MED ORDER — LIDOCAINE-EPINEPHRINE 1 %-1:100000 IJ SOLN
INTRAMUSCULAR | Status: DC | PRN
  Administered 2020-03-27: 4 mL

## 2020-03-27 MED ORDER — LIDOCAINE-EPINEPHRINE 1 %-1:100000 IJ SOLN
INTRAMUSCULAR | Status: AC
  Filled 2020-03-27: qty 1

## 2020-03-27 MED ORDER — CEFAZOLIN SODIUM-DEXTROSE 2-4 GM/100ML-% IV SOLN
2.0000 g | INTRAVENOUS | Status: AC
  Administered 2020-03-27: 2 g via INTRAVENOUS
  Filled 2020-03-27: qty 100

## 2020-03-27 MED ORDER — OXYCODONE HCL 5 MG/5ML PO SOLN: 5.0000 mg | Freq: Once | ORAL | Status: AC | PRN

## 2020-03-27 MED ORDER — ONDANSETRON HCL 4 MG/2ML IJ SOLN
INTRAMUSCULAR | Status: DC | PRN
  Administered 2020-03-27: 4 mg via INTRAVENOUS

## 2020-03-27 MED ORDER — FENTANYL CITRATE (PF) 250 MCG/5ML IJ SOLN
INTRAMUSCULAR | Status: DC | PRN
  Administered 2020-03-27: 25 ug via INTRAVENOUS
  Administered 2020-03-27: 50 ug via INTRAVENOUS
  Administered 2020-03-27: 100 ug via INTRAVENOUS
  Administered 2020-03-27: 75 ug via INTRAVENOUS

## 2020-03-27 MED ORDER — KETOROLAC TROMETHAMINE 30 MG/ML IJ SOLN
30.0000 mg | Freq: Once | INTRAMUSCULAR | Status: AC | PRN
  Administered 2020-03-27: 30 mg via INTRAVENOUS

## 2020-03-27 MED ORDER — OXYCODONE HCL 5 MG PO TABS: 5.0000 mg | ORAL_TABLET | Freq: Once | ORAL | Status: AC | PRN

## 2020-03-27 MED ORDER — OXYCODONE HCL 5 MG PO TABS
ORAL_TABLET | ORAL | Status: AC
  Administered 2020-03-27: 5 mg via ORAL
  Filled 2020-03-27: qty 1

## 2020-03-27 MED ORDER — OXYCODONE-ACETAMINOPHEN 5-325 MG PO TABS: 1.0000 | ORAL_TABLET | ORAL | 0 refills | Status: DC | PRN

## 2020-03-27 MED ORDER — DEXAMETHASONE SODIUM PHOSPHATE 10 MG/ML IJ SOLN
INTRAMUSCULAR | Status: DC | PRN
  Administered 2020-03-27: 5 mg via INTRAVENOUS

## 2020-03-27 MED ORDER — CEPHALEXIN 500 MG PO CAPS: 500.0000 mg | ORAL_CAPSULE | Freq: Two times a day (BID) | ORAL | 0 refills | Status: DC

## 2020-03-27 MED ORDER — OXYMETAZOLINE HCL 0.05 % NA SOLN
NASAL | Status: AC
  Filled 2020-03-27: qty 30

## 2020-03-27 MED ORDER — MEPERIDINE HCL 25 MG/ML IJ SOLN: 6.2500 mg | INTRAMUSCULAR | Status: DC | PRN

## 2020-03-27 SURGICAL SUPPLY — 26 items
BLADE SURG 15 STRL LF DISP TIS (BLADE) IMPLANT
BLADE SURG 15 STRL SS (BLADE)
CANISTER SUCT 1200ML W/VALVE (MISCELLANEOUS) ×2 IMPLANT
COVER BACK TABLE 60X90IN (DRAPES) ×2 IMPLANT
COVER MAYO STAND STRL (DRAPES) ×2 IMPLANT
COVER WAND RF STERILE (DRAPES) IMPLANT
DRAPE HALF SHEET 40X57 (DRAPES) ×2 IMPLANT
GAUZE SPONGE 2X2 8PLY STRL LF (GAUZE/BANDAGES/DRESSINGS) ×1 IMPLANT
GAUZE SPONGE 4X4 12PLY STRL (GAUZE/BANDAGES/DRESSINGS) ×2 IMPLANT
GLOVE BIO SURGEON STRL SZ 6 (GLOVE) ×2 IMPLANT
GOWN STRL NON-REIN LRG LVL3 (GOWN DISPOSABLE) ×4 IMPLANT
KIT BASIN OR (CUSTOM PROCEDURE TRAY) ×2 IMPLANT
KIT SPLINT NASAL DENVER LRG BE (GAUZE/BANDAGES/DRESSINGS) ×2 IMPLANT
KIT SPLINT NASAL DENVER MIN BE (GAUZE/BANDAGES/DRESSINGS) IMPLANT
KIT SPLINT NASAL DENVER PET BE (GAUZE/BANDAGES/DRESSINGS) IMPLANT
KIT SPLINT NASAL DENVER SM BEI (GAUZE/BANDAGES/DRESSINGS) IMPLANT
NEEDLE PRECISIONGLIDE 27X1.5 (NEEDLE) IMPLANT
PATTIES SURGICAL .5 X3 (DISPOSABLE) ×2 IMPLANT
SPLINT NASAL DOYLE BI-VL (GAUZE/BANDAGES/DRESSINGS) ×2 IMPLANT
SPLINT NASAL THERMO PLAST (MISCELLANEOUS) IMPLANT
SPONGE GAUZE 2X2 STER 10/PKG (GAUZE/BANDAGES/DRESSINGS) ×1
SUT CHROMIC 4 0 P 3 18 (SUTURE) IMPLANT
SUT PROLENE 2 0 CT2 30 (SUTURE) ×2 IMPLANT
SYR CONTROL 10ML LL (SYRINGE) ×2 IMPLANT
TOWEL GREEN STERILE FF (TOWEL DISPOSABLE) ×2 IMPLANT
TUBE CONNECTING 20X1/4 (TUBING) ×2 IMPLANT

## 2020-03-27 NOTE — Transfer of Care (Signed)
Immediate Anesthesia Transfer of Care Note  Patient: Kenneth [name]  Procedure(s) Performed: CLOSED REDUCTION NASAL BONES (N/A Nose)  Patient Location: PACU  Anesthesia Type:General  Level of Consciousness: awake, alert  and oriented  Airway & Oxygen Therapy: Patient Spontanous Breathing and Patient connected to face mask oxygen  Post-op Assessment: Report given to RN, Post -op Vital signs reviewed and stable and Patient moving all extremities X 4  Post vital signs: Reviewed and stable  Last Vitals:  Vitals Value Taken Time  BP 187/113 03/27/20 1158  Temp    Pulse 78 03/27/20 1200  Resp 11 03/27/20 1200  SpO2 97 % 03/27/20 1200  Vitals shown include unvalidated device data.  Last Pain:  Vitals:   03/27/20 0947  PainSc: 0-No pain      Patients Stated Pain Goal: 2 ([id number])  Complications: No apparent anesthesia complications

## 2020-03-27 NOTE — Anesthesia Procedure Notes (Signed)
Procedure Name: Intubation Date/Time: 03/27/2020 11:17 AM Performed by: Janene Harvey, CRNA Pre-anesthesia Checklist: Patient identified, Emergency Drugs available, Suction available and Patient being monitored Patient Re-evaluated:Patient Re-evaluated prior to induction Oxygen Delivery Method: Circle system utilized Preoxygenation: Pre-oxygenation with 100% oxygen Induction Type: IV induction Ventilation: Mask ventilation without difficulty Laryngoscope Size: Mac and 4 Grade View: Grade III Tube type: Oral Number of attempts: 1 Airway Equipment and Method: Stylet and Oral airway Placement Confirmation: ETT inserted through vocal cords under direct vision,  positive ETCO2 and breath sounds checked- equal and bilateral Secured at: 23 cm Tube secured with: Tape Dental Injury: Teeth and Oropharynx as per pre-operative assessment
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
## 2020-03-27 NOTE — Interval H&P Note (Signed)
History and Physical Interval Note:  03/27/2020 10:22 AM  Kenneth [name]  has presented today for surgery, with the diagnosis of closed fracture nasal bone.  The various methods of treatment have been discussed with the patient and family. After consideration of risks, benefits and other options for treatment, the patient has consented to  Procedure(s): CLOSED REDUCTION NASAL BONES (N/A) as a surgical intervention.  The patient's history has been reviewed, patient examined, no change in status, stable for surgery.  I have reviewed the patient's chart and labs.  Questions were answered to the patient's satisfaction.     Arnoldo Hooker Rai Severns

## 2020-03-27 NOTE — Anesthesia Postprocedure Evaluation (Signed)
Anesthesia Post Note  Patient: Kenneth [name]  Procedure(s) Performed: CLOSED REDUCTION NASAL BONES (N/A Nose)     Patient location during evaluation: PACU Anesthesia Type: General Level of consciousness: awake and alert and oriented Pain management: pain level controlled Vital Signs Assessment: post-procedure vital signs reviewed and stable Respiratory status: spontaneous breathing, nonlabored ventilation and respiratory function stable Cardiovascular status: blood pressure returned to baseline Postop Assessment: no apparent nausea or vomiting Anesthetic complications: no    Last Vitals:  Vitals:   03/27/20 1200 03/27/20 1215  BP: (!) 187/113   Pulse: 73 76  Resp: 10 16  Temp: 36.5 C   SpO2: 99% 97%    Last Pain:  Vitals:   03/27/20 1240  PainSc: [place]

## 2020-03-31 NOTE — Addendum Note (Signed)
Addendum  created 03/31/20 1920 by Lyn Hollingshead, MD   Intraprocedure Staff edited

## 2020-04-14 ENCOUNTER — Other Ambulatory Visit: Payer: Self-pay | Admitting: Internal Medicine

## 2020-04-16 ENCOUNTER — Telehealth: Payer: Self-pay | Admitting: Internal Medicine

## 2020-04-16 NOTE — Telephone Encounter (Signed)
Would see if he could use a copay card to help with costs - Delene Loll would be a more ideal medication for him.  Dr Lemmie Evens

## 2020-04-16 NOTE — Telephone Encounter (Signed)
Delene Loll is $500/month His insurance is supposed to be straightened on May 1 His has been out of Entresto for 1 month BP 150-170/110  Rx: [facility] on [address], [place]  Would like to know if he can take lisinopril (or another med, [facility] will not require 36 hr "wash out") in the interim and resume entresto when his insurance is OK

## 2020-04-16 NOTE — Telephone Encounter (Signed)
Can we give him samples for a month?  Dr Lemmie Evens

## 2020-04-16 NOTE — Telephone Encounter (Signed)
Ok to refill.  Dr. H 

## 2020-04-16 NOTE — Telephone Encounter (Signed)
   Pt c/o medication issue:  1. Name of Medication: sacubitril-valsartan (ENTRESTO) 49-51 MG  2. How are you currently taking this medication (dosage and times per day)?   3. Are you having a reaction (difficulty breathing--STAT)?   4. What is your medication issue? Pt said Delene Loll is expensive and having issue with his insurance right now. He is wondering if Dr. Debara Pickett put him on Lisinopril as of the moment to control his BP since he is trying to fix his insurance for [facility].  Please advise

## 2020-04-16 NOTE — Telephone Encounter (Signed)
Please advise on refill request

## 2020-04-16 NOTE — Telephone Encounter (Signed)
  *  STAT* If patient is at the pharmacy, call can be transferred to refill team.   1. Which medications need to be refilled? (please list name of each medication and dose if known) sildenafil (VIAGRA) 50 MG tablet  2. Which pharmacy/location (including street and city if local pharmacy) is medication to be sent to [facility], [address] [place] [address]  3. Do they need a 30 day or 90 day supply? 30 days

## 2020-04-17 MED ORDER — SILDENAFIL CITRATE 50 MG PO TABS: ORAL_TABLET | ORAL | 0 refills | Status: DC

## 2020-04-17 MED ORDER — LISINOPRIL 40 MG PO TABS: 40.0000 mg | ORAL_TABLET | Freq: Every day | ORAL | 0 refills | Status: DC

## 2020-04-17 NOTE — Telephone Encounter (Signed)
Rx refilled to preferred pharmacy

## 2020-04-17 NOTE — Telephone Encounter (Signed)
Ok to start lisinopril 40 mg daily. Will transition back to [place] when insurance worked out.  Dr Lemmie Evens

## 2020-04-17 NOTE — Telephone Encounter (Signed)
Rx(s) sent to pharmacy electronically. LM for patient about med change/recommentations form MD

## 2020-06-02 ENCOUNTER — Other Ambulatory Visit: Payer: Self-pay | Admitting: Adult Health

## 2020-06-03 NOTE — Telephone Encounter (Signed)
Rx request sent to pharmacy.  

## 2020-08-19 ENCOUNTER — Telehealth: Payer: Self-pay | Admitting: Internal Medicine

## 2020-08-19 NOTE — Telephone Encounter (Signed)
Thanks for the FYI.  Dr Lemmie Evens

## 2020-08-19 NOTE — Telephone Encounter (Signed)
STAT if patient feels like he/she is going to faint   1) Are you dizzy now? No   2) Do you feel faint or have you passed out? Has not passed out, but has felt faint. Did get sick to his stomach once and threw up  3) Do you have any other symptoms? Fatigue, SOB & double vision   4) Have you checked your HR and BP (record if available)? Yes, was 130/80 HR in 70's when seeing PCP yesterday   Has been going on really bad all last week. Has gotten a little better this week, but still occurring.

## 2020-08-19 NOTE — Telephone Encounter (Signed)
Spoke to patient he stated he is feeling better today.Stated last week when he stood up from sitting he became dizzy.He has been sob.He is tired,no energy.Also he has been having double vision.He saw PCP yesterday and was prescribed medication for dizziness.PCP advised him to see Dr.Hilty.Advised to keep appointment already scheduled with Coletta Memos NP 8/30 at 8:15 am.

## 2020-08-23 NOTE — Progress Notes (Signed)
Cardiology Clinic Note   Patient Name: Kenneth [name] Date of Encounter: 08/24/2020  Primary Care Provider:  Curly Rim, MD Primary Cardiologist:  Pixie Casino, MD  Patient Profile     Kenneth [name]. Piechota [name] presents to the clinic today for an evaluation of his fatigue, shortness of breath, and double vision.  Past Medical History    Past Medical History:  Diagnosis Date   Arthritis    CAD (coronary artery disease)    a. 02/2014 Abnl MV with defect in LCX territory;  b. 02/2014 Cath: LAD 60-70p, 33m, LCX 100 CTO, OM1 95p, 99d, RCA 100 CTO w/ L->R collaterals; c. 02/2014 CABG x 4 (LIMA->LAD, VG->OM, VG->RPDA->RPL); d. 01/2016 MV: 41%, med size, sev intensity fixed basal inferior lateral defect w/o ischemia.   CAD (coronary artery disease)    Carotid arterial disease ([place])    a. 06/2013 Carotid U/S: moderate narrowing of both subclavian arteries, with normal carotid arteries;  b. 02/2014 Carotid U/S: 1-39% bilat ICA stenosis.   Depression    Fracture    nasal bone   History of echocardiogram    a. 02/2016 Echo: EF 50-55%, no rwma, mildly dil LA, nl RV fxn, mildly dil RA.   History of kidney stones    Hyperlipidemia    Hypertension    Hypertensive heart disease    PONV (postoperative nausea and vomiting)    Syndrome X, cardiac ([place])    a. Previously felt to have microvascular angina following remote cath in [place]-->treated with nitrates, ranexa, EECP.  Later found to have multivessel CAD.   Wears glasses    Past Surgical History:  Procedure Laterality Date   CARDIAC CATHETERIZATION  2005   CLOSED REDUCTION NASAL FRACTURE N/A 03/27/2020   Procedure: CLOSED REDUCTION NASAL BONES;  Surgeon: Irene Limbo, MD;  Location: [facility];  Service: Plastics;  Laterality: N/A;   COLONOSCOPY     CORONARY ARTERY BYPASS GRAFT N/A 03/18/2014   Procedure: CORONARY ARTERY BYPASS GRAFTING (CABG) x 4 using endoscopically harvested right saphenous vein and left  internal mammary artery;  Surgeon: Gaye Pollack, MD;  Location: [facility];  Service: Open Heart Surgery;  Laterality: N/A;   CORONARY ARTERY BYPASS GRAFT     HAND SURGERY Left    HIP ARTHROPLASTY     INTRAOPERATIVE TRANSESOPHAGEAL ECHOCARDIOGRAM N/A 03/18/2014   Procedure: INTRAOPERATIVE TRANSESOPHAGEAL ECHOCARDIOGRAM;  Surgeon: Gaye Pollack, MD;  Location: [facility];  Service: Open Heart Surgery;  Laterality: N/A;   KIDNEY STONE SURGERY  age 54   LEFT HEART CATHETERIZATION WITH CORONARY ANGIOGRAM N/A 03/14/2014   Procedure: LEFT HEART CATHETERIZATION WITH CORONARY ANGIOGRAM;  Surgeon: Troy Sine, MD;  Location: [facility] CATH LAB;  Service: Cardiovascular;  Laterality: N/A;   SHOULDER ARTHROSCOPY  2001   L shoulder   SHOULDER SURGERY     TOTAL HIP ARTHROPLASTY Left 10/26/2016   Procedure: LEFT TOTAL HIP ARTHROPLASTY ANTERIOR APPROACH;  Surgeon: Gaynelle Arabian, MD;  Location: [facility];  Service: Orthopedics;  Laterality: Left;    Allergies  Allergies  Allergen Reactions   Tetracyclines & Related Nausea And Vomiting    History of Present Illness    Mr. Kenneth [name] has a PMH of HTN, coronary artery disease (2015 multivessel disease), CABG x4 LIMA-LAD, SVG-OM, SVG to RPDA, and RPL.  His left ventricular ejection fraction at that time was 41% with an inferior deficit.  His cardiac catheterization showed chronic total occlusion of his RCA with left to right collaterals.  Subsequently  his EF improved to 50-55%.  January 2019 he experienced hypertension and chest discomfort.  He underwent repeat echocardiogram which showed an EF of 40-45% with inferior akinesis, his stress test showed an EF of 46% with fixed inferior defect suggestive of scar with no reversible ischemia.  With medication adjustment his blood pressure improved.  His PMH also includes chronic systolic heart failure, osteoarthritis of the hip, dyslipidemia, obesity, and depression.  His last seen by Dr. Debara Pickett on 01/09/2020.  During that  time he continued to have difficulty with hypertension.  He was continued on Repatha and was tolerating that well.  He reported that he did get fatigued and occasionally short of breath.  He reported anxiousness and panic attacks.  He was trying to come off of his ADHD medicine to see if that would help.  He was also meeting with a psychiatrist.  He contacted nurse triage line on 08/19/2020 and indicated that he had 2 weeks of fatigue, shortness of breath and double vision.  When he presented to PCP on 08/18/2020 his blood pressure was 130/80 and heart rate was in the 70s.  He presents to the clinic today for evaluation with his wife and states over the last month he has lost around 15 pounds. He has also had intermittent bouts of nausea, double vision, and poor appetite. He stated that he stopped his metoprolol succinate around 2 months ago. He felt that this medication was making him weak and giving him muscle aches. He is also noticed some memory deficits where he will not remember conversations that he is having with individuals and forget entire days. He states that he has not been hydrating well and also notices dizziness. His dizziness comes on with increased physical activity. He states that is usually associated with changes in his heart rate. He presented to his PCP who prescribed meclizine. He indicates that over the past few days he has not had any double vision and has improved somewhat. However, he feels like he has had similar symptoms for up to a year. He is only able to complete about 2-3 hours of physical activity daily before noticing  fatigued. I will order an echocardiogram, 7-day ZIO monitor, BMP, CBC, lipid panel, and have him follow-up in 6 weeks. I will also refer to neurology for follow-up evaluation of his memory loss. I have instructed him to increase his p.o. hydration and monitor his blood pressure when he does not feel well.   Today he denies chest pain, shortness of breath, lower  extremity edema, fatigue, palpitations, melena, hematuria, hemoptysis, diaphoresis, weakness, presyncope, syncope, orthopnea, and PND.   Home Medications    Prior to Admission medications   Medication Sig Start Date End Date Taking? Authorizing Provider  atorvastatin (LIPITOR) 80 MG tablet TAKE 1 TABLET (80 MG TOTAL) BY MOUTH AT BEDTIME. 05/14/19   Hilty, Nadean Corwin, MD  cephALEXin (KEFLEX) 500 MG capsule Take 1 capsule (500 mg total) by mouth 2 (two) times daily. 03/27/20   Irene Limbo, MD  DULoxetine (CYMBALTA) 60 MG capsule Take 60 mg by mouth daily.  09/11/19 09/10/20  [provider]  ezetimibe (ZETIA) 10 MG tablet TAKE 1 TABLET BY MOUTH EVERY DAY 06/03/20   Hilty, Nadean Corwin, MD  hydrochlorothiazide (HYDRODIURIL) 25 MG tablet Take 25 mg by mouth daily.  09/11/19   [provider]  lisinopril (ZESTRIL) 40 MG tablet Take 1 tablet (40 mg total) by mouth daily. 04/17/20 07/16/20  Pixie Casino, MD  metoprolol succinate (TOPROL  XL) 25 MG 24 hr tablet Take 1 tablet (25 mg total) by mouth daily. Patient taking differently: Take 25 mg by mouth in the morning and at bedtime.  03/28/19   Hilty, Nadean Corwin, MD  ondansetron (ZOFRAN ODT) 8 MG disintegrating tablet Take 1 tablet (8 mg total) by mouth every 8 (eight) hours as needed. 8mg  ODT q4 hours prn nausea 03/20/20   Ripley Fraise, MD  oxyCODONE-acetaminophen (PERCOCET) 5-325 MG tablet Take 1 tablet by mouth every 4 (four) hours as needed for severe pain. 03/27/20 03/27/21  Irene Limbo, MD  prednisoLONE acetate (PRED FORTE) 1 % ophthalmic suspension Place 1 drop into both eyes 4 (four) times daily.    [provider]  REPATHA SURECLICK 627 MG/ML SOAJ INJECT 140 MG INTO THE SKIN EVERY 14 (FOURTEEN) DAYS. Patient taking differently: Inject 140 mg into the skin every 14 (fourteen) days.  01/21/20   Hilty, Nadean Corwin, MD  sacubitril-valsartan (ENTRESTO) 49-51 MG Take 1 tablet by mouth 2 (two) times daily. 01/09/20   Hilty, Nadean Corwin, MD  sildenafil (VIAGRA) 50 MG tablet TAKE 1 TABLET BY MOUTH DAILY AS NEEDED FOR ERECTILE DYSFUNCTION 04/17/20   Pixie Casino, MD  testosterone cypionate (DEPOTESTOSTERONE CYPIONATE) 200 MG/ML injection Inject 300 mg into the muscle every Wednesday.  01/05/16   [provider]    Family History    Family History  Problem Relation Age of Onset   Heart attack Mother    Stroke Mother    Diabetes Mother    Heart attack Father    Cancer Father        lung   He indicated that his mother is deceased. He indicated that his father is deceased. He indicated that his sister is alive. He indicated that his son is alive.  Social History    Social History   Socioeconomic History   Marital status: Married    Spouse name: Not on file   Number of children: Not on file   Years of education: Not on file   Highest education level: Not on file  Occupational History   Not on file  Tobacco Use   Smoking status: Former Smoker    Types: Cigars    Quit date: 06/26/2012    Years since quitting: 8.1   Smokeless tobacco: Never Used  Vaping Use   Vaping Use: Never used  Substance and Sexual Activity   Alcohol use: Not Currently   Drug use: Not Currently   Sexual activity: Yes    Birth control/protection: None  Other Topics Concern   Not on file  Social History Narrative   ** Merged History Encounter **       Social Determinants of Health   Financial Resource Strain:    Difficulty of Paying Living Expenses: Not on file  Food Insecurity:    Worried About Charity fundraiser in the Last Year: Not on file   [employer] of Food in the Last Year: Not on file  Transportation Needs:    Lack of Transportation (Medical): Not on file   Lack of Transportation (Non-Medical): Not on file  Physical Activity:    Days of Exercise per Week: Not on file   Minutes of Exercise per Session: Not on file  Stress:    Feeling of Stress : Not on file  Social Connections:     Frequency of Communication with Friends and Family: Not on file   Frequency of Social Gatherings with Friends and Family: Not on  file   Attends Religious Services: Not on file   Active Member of Clubs or Organizations: Not on file   Attends Archivist Meetings: Not on file   Marital Status: Not on file  Intimate Partner Violence:    Fear of Current or Ex-Partner: Not on file   Emotionally Abused: Not on file   Physically Abused: Not on file   Sexually Abused: Not on file     Review of Systems    General:  No chills, fever, night sweats or weight changes.  Cardiovascular:  No chest pain, dyspnea on exertion, edema, orthopnea, palpitations, paroxysmal nocturnal dyspnea. Dermatological: No rash, lesions/masses Respiratory: No cough, dyspnea Urologic: No hematuria, dysuria Abdominal:   No nausea, vomiting, diarrhea, bright red blood per rectum, melena, or hematemesis Neurologic:  No visual changes, wkns, changes in mental status. All other systems reviewed and are otherwise negative except as noted above.  Physical Exam    VS:  BP (!) 110/98    Pulse 93    Ht 5\' 9"  (1.753 m)    Wt 222 lb 12.8 oz (101.1 kg)    SpO2 98%    BMI 32.90 kg/m  , BMI Body mass index is 32.9 kg/m. GEN: Well nourished, well developed, in no acute distress. HEENT: normal. Neck: Supple, no JVD, carotid bruits, or masses. Cardiac: RRR, no murmurs, rubs, or gallops. No clubbing, cyanosis, edema.  Radials/DP/PT 2+ and equal bilaterally.  Respiratory:  Respirations regular and unlabored, clear to auscultation bilaterally. GI: Soft, nontender, nondistended, BS + x 4. MS: no deformity or atrophy. Skin: warm and dry, no rash. Neuro:  Strength and sensation are intact. Psych: Normal affect.  Accessory Clinical Findings    Recent Labs: 03/20/2020: ALT 34; BUN 16; Creatinine, Ser 1.50; Hemoglobin 17.0; Platelets 328; Potassium 3.4; Sodium 136   Recent Lipid Panel    Component Value Date/Time    CHOL 275 (H) 01/24/2018 1104   CHOL 184 08/18/2015 1104   TRIG 175 (H) 01/24/2018 1104   TRIG 143 08/18/2015 1104   HDL 36 (L) 01/24/2018 1104   HDL 28 (L) 08/18/2015 1104   CHOLHDL 7.6 (H) 01/24/2018 1104   CHOLHDL 5.5 03/13/2014 1100   VLDL 22 03/13/2014 1100   LDLCALC 204 (H) 01/24/2018 1104   LDLCALC 127 (H) 08/18/2015 1104    ECG personally reviewed by me today-none today.  EKG 03/20/2020 Sinus rhythm left atrial enlargement left axis deviation 74 bpm  Echocardiogram 01/31/2018 Study Conclusions   - Left ventricle: The cavity size was normal. Wall thickness was  increased in a pattern of moderate LVH. Systolic function was  mildly to moderately reduced. The estimated ejection fraction was  in the range of 40% to 45%. There is hypokinesis of the  inferolateral and inferior myocardium. Doppler parameters are  consistent with abnormal left ventricular relaxation (grade 1  diastolic dysfunction).  - Left atrium: The atrium was mildly dilated.    Assessment & Plan   1.  Fatigue/DOE/palpitations -has begun to get somewhat better over the last 2 weeks.  Denies rapid heart rate palpitations, presyncope and syncope. Heart healthy low-sodium diet-salty 6 given Increase physical activity as tolerated Increase caloric intake, p.o. hydration Order echocardiogram 7-day ZIO monitor CBC, BMP  Ischemic cardiomyopathy-presents with some activity intolerance and DOE.  Echocardiogram 2/19 showed an EF of 40-45%, G1 DD Continue Entresto,  lisinopril, HCTZ Heart healthy low-sodium diet-salty 6 given Increase physical activity as tolerated  Coronary artery disease-no chest pain today.  Status post  four-vessel CABG 3/15 (LIMA-LAD, SVG-OM, SVG PDA, and SVG to RPL. Continue atorvastatin, lisinopril, hydrochlorothiazide Heart healthy low-sodium diet-salty 6 given Increase physical activity as tolerated   Essential hypertension-BP today 110/98.  Well-controlled at home Continue  HCTZ, lisinopril, Entresto Heart healthy low-sodium diet-salty 6 gien Increase physical activity as tolerated  Familial hyperlipidemia-LDL 204 on 01/24/2018 Continue Repatha, lipitor, zetia Increase physical activity as tolerated Heart healthy low-sodium high-fiber diet Order lipid panel  Memory loss-over the last several weeks he has noticed intermittent periods of memory loss. Had CVA in April with trauma to his face. Consult neurology  Disposition: Follow-up with me or Dr. Debara Pickett after echocardiogram/ZIO 5 to 6 weeks.   Jossie Ng. Gean Larose NP-C    08/24/2020, 8:57 AM [address] Office [phone] Fax [phone]  Notice: This dictation was prepared with Dragon dictation along with smaller phrase technology. Any transcriptional errors that result from this process are unintentional and may not be corrected upon review.

## 2020-08-24 ENCOUNTER — Other Ambulatory Visit: Payer: Self-pay

## 2020-08-24 ENCOUNTER — Encounter: Payer: Self-pay | Admitting: *Deleted

## 2020-08-24 ENCOUNTER — Encounter: Payer: Self-pay | Admitting: General Practice

## 2020-08-24 ENCOUNTER — Ambulatory Visit: Payer: BC Managed Care – PPO | Admitting: General Practice

## 2020-08-24 VITALS — BP 110/98 | HR 93 | Ht 69.0 in | Wt 222.8 lb

## 2020-08-24 DIAGNOSIS — I2510 Atherosclerotic heart disease of native coronary artery without angina pectoris: Secondary | ICD-10-CM | POA: Diagnosis not present

## 2020-08-24 DIAGNOSIS — R5383 Other fatigue: Secondary | ICD-10-CM

## 2020-08-24 DIAGNOSIS — R0600 Dyspnea, unspecified: Secondary | ICD-10-CM

## 2020-08-24 DIAGNOSIS — I255 Ischemic cardiomyopathy: Secondary | ICD-10-CM

## 2020-08-24 DIAGNOSIS — R413 Other amnesia: Secondary | ICD-10-CM

## 2020-08-24 DIAGNOSIS — I10 Essential (primary) hypertension: Secondary | ICD-10-CM

## 2020-08-24 DIAGNOSIS — R002 Palpitations: Secondary | ICD-10-CM

## 2020-08-24 DIAGNOSIS — Z79899 Other long term (current) drug therapy: Secondary | ICD-10-CM

## 2020-08-24 DIAGNOSIS — R0609 Other forms of dyspnea: Secondary | ICD-10-CM

## 2020-08-24 DIAGNOSIS — E7849 Other hyperlipidemia: Secondary | ICD-10-CM

## 2020-08-24 LAB — LIPID PANEL
Chol/HDL Ratio: 8 ratio — ABNORMAL HIGH (ref 0.0–5.0)
Cholesterol, Total: 346 mg/dL — ABNORMAL HIGH (ref 100–199)
HDL: 43 mg/dL (ref >39)
LDL Chol Calc (NIH): 257 mg/dL — ABNORMAL HIGH (ref 0–99)
Triglycerides: 221 mg/dL — ABNORMAL HIGH (ref 0–149)
VLDL Cholesterol Cal: 46 mg/dL — ABNORMAL HIGH (ref 5–40)

## 2020-08-24 LAB — BASIC METABOLIC PANEL
BUN/Creatinine Ratio: 16 (ref 9–20)
BUN: 20 mg/dL (ref 6–24)
CO2: 27 mmol/L (ref 20–29)
Calcium: 10.1 mg/dL (ref 8.7–10.2)
Chloride: 97 mmol/L (ref 96–106)
Creatinine, Ser: 1.25 mg/dL (ref 0.76–1.27)
GFR calc Af Amer: 74 mL/min/{1.73_m2} (ref >59)
GFR calc non Af Amer: 64 mL/min/{1.73_m2} (ref >59)
Glucose: 106 mg/dL — ABNORMAL HIGH (ref 65–99)
Potassium: 4 mmol/L (ref 3.5–5.2)
Sodium: 139 mmol/L (ref 134–144)

## 2020-08-24 LAB — CBC
Hematocrit: 49.4 % (ref 37.5–51.0)
Hemoglobin: 16.8 g/dL (ref 13.0–17.7)
MCH: 29.3 pg (ref 26.6–33.0)
MCHC: 34 g/dL (ref 31.5–35.7)
MCV: 86 fL (ref 79–97)
Platelets: 276 10*3/uL (ref 150–450)
RBC: 5.73 x10E6/uL (ref 4.14–5.80)
RDW: 14.3 % (ref 11.6–15.4)
WBC: 8.2 10*3/uL (ref 3.4–10.8)

## 2020-08-24 MED ORDER — ATORVASTATIN CALCIUM 80 MG PO TABS: 80.0000 mg | ORAL_TABLET | Freq: Every day | ORAL | 3 refills | Status: DC

## 2020-08-24 NOTE — Progress Notes (Signed)
Patient ID: Kenneth [name], male   DOB: [birth year], 54 y.o.   MRN: [id number] Patient enrolled for [employer] to ship a 7 day ZIO XT long term holter monitor to his home.

## 2020-08-24 NOTE — Patient Instructions (Signed)
Medication Instructions:  Continue current medications  *If you need a refill on your cardiac medications before your next appointment, please call your pharmacy*   Lab Work: CBC, BMP, and fasting Lipids  If you have labs (blood work) drawn today and your tests are completely normal, you will receive your results only by: Marland Kitchen MyChart Message (if you have MyChart) OR . A paper copy in the mail If you have any lab test that is abnormal or we need to change your treatment, we will call you to review the results.   Testing/Procedures: Your physician has requested that you have an echocardiogram. Echocardiography is a painless test that uses sound waves to create images of your heart. It provides your doctor with information about the size and shape of your heart and how well your heart's chambers and valves are working. This procedure takes approximately one hour. There are no restrictions for this procedure.  Your physician has recommended that you wear a 7 day Zio monitor. Holter monitors are medical devices that record the heart's electrical activity. Doctors most often use these monitors to diagnose arrhythmias. Arrhythmias are problems with the speed or rhythm of the heartbeat. The monitor is a small, portable device. You can wear one while you do your normal daily activities. This is usually used to diagnose what is causing palpitations/syncope (passing out).   Follow-Up: At [facility], you and your health needs are our priority.  As part of our continuing mission to provide you with exceptional heart care, we have created designated Provider Care Teams.  These Care Teams include your primary Cardiologist (physician) and Advanced Practice Providers (APPs -  Physician Assistants and Nurse Practitioners) who all work together to provide you with the care you need, when you need it.  We recommend signing up for the patient portal called "MyChart".  Sign up information is provided on this  After Visit Summary.  MyChart is used to connect with patients for Virtual Visits (Telemedicine).  Patients are able to view lab/test results, encounter notes, upcoming appointments, etc.  Non-urgent messages can be sent to your provider as well.   To learn more about what you can do with MyChart, go to [email or web address].    Your next appointment:   6 week(s)  The format for your next appointment:   In Person  Provider:   You may see Pixie Casino, MD or one of the following Advanced Practice Providers on your designated Care Team:    Coletta Memos    Other Instructions [place] Monitor Instructions   Your physician has requested you wear your ZIO patch monitor 7 days.   This is a single patch monitor.  [employer] supplies one patch monitor per enrollment.  Additional stickers are not available.   Please do not apply patch if you will be having a Nuclear Stress Test, Echocardiogram, Cardiac CT, MRI, or Chest Xray during the time frame you would be wearing the monitor. The patch cannot be worn during these tests.  You cannot remove and re-apply the ZIO XT patch monitor.   Your ZIO patch monitor will be sent USPS Priority mail from [facility] directly to your home address. The monitor may also be mailed to a PO BOX if home delivery is not available.   It may take 3-5 days to receive your monitor after you have been enrolled.   Once you have received you monitor, please review enclosed instructions.  Your monitor has already been registered assigning a  specific monitor serial # to you.   Applying the monitor   Shave hair from upper left chest.   Hold abrader disc by orange tab.  Rub abrader in 40 strokes over left upper chest as indicated in your monitor instructions.   Clean area with 4 enclosed alcohol pads .  Use all pads to assure are is cleaned thoroughly.  Let dry.   Apply patch as indicated in monitor instructions.  Patch will be place under collarbone  on left side of chest with arrow pointing upward.   Rub patch adhesive wings for 2 minutes.Remove white label marked "1".  Remove white label marked "2".  Rub patch adhesive wings for 2 additional minutes.   While looking in a mirror, press and release button in center of patch.  A small green light will flash 3-4 times .  This will be your only indicator the monitor has been turned on.     Do not shower for the first 24 hours.  You may shower after the first 24 hours.   Press button if you feel a symptom. You will hear a small click.  Record Date, Time and Symptom in the Patient Log Book.   When you are ready to remove patch, follow instructions on last 2 pages of Patient Log Book.  Stick patch monitor onto last page of Patient Log Book.   Place Patient Log Book in [place] box.  Use locking tab on box and tape box closed securely.  The [employer] has [employer] on it.  Please place in mailbox as soon as possible.  Your physician should have your test results approximately 7 days after the monitor has been mailed back to [facility].   Call [place] at [phone] if you have questions regarding your ZIO XT patch monitor.  Call them immediately if you see an orange light blinking on your monitor.   If your monitor falls off in less than 4 days contact our Monitor department at [phone].  If your monitor becomes loose or falls off after 4 days call [employer] at [phone] for suggestions on securing your monitor.

## 2020-08-26 ENCOUNTER — Telehealth: Payer: Self-pay | Admitting: Internal Medicine

## 2020-08-26 DIAGNOSIS — E7849 Other hyperlipidemia: Secondary | ICD-10-CM

## 2020-08-26 DIAGNOSIS — Z951 Presence of aortocoronary bypass graft: Secondary | ICD-10-CM

## 2020-08-26 DIAGNOSIS — E785 Hyperlipidemia, unspecified: Secondary | ICD-10-CM

## 2020-08-26 MED ORDER — REPATHA SURECLICK 140 MG/ML ~~LOC~~ SOAJ: 1.0000 | SUBCUTANEOUS | 3 refills | Status: DC

## 2020-08-26 NOTE — Telephone Encounter (Signed)
Rx sent to [facility] so that prior authorization can be generated if needed. Unable to initiate via CMM at this time. Will wait for info from pharmacy

## 2020-08-26 NOTE — Telephone Encounter (Signed)
Advised patient of lab results  Patient has not been taking Repatha secondary to insurance issues  He now needs new PA  Will forward to [place] with Dr Debara Pickett so she can work on [place]     Deberah Pelton, NP  08/24/2020 7:44 PM EDT     Please contact Mr. Larch and let him know that his complete blood count has also been reviewed. It is normal at this time. Thank you.   Deberah Pelton, NP  08/24/2020 4:46 PM EDT     Please contact Mr.Eanes and let him know that his cholesterol panel has been reviewed. His overall cholesterol is elevated at 346, triglycerides 221, and his HDL is 257. Please make sure he was fasting prior to taking his medicines. Also check to make sure that he is taking his Repatha. Please refer him to the lipid clinic for further evaluation and treatment. Thank you.

## 2020-08-26 NOTE — Telephone Encounter (Signed)
Patient is returning call to discuss results from lab work completed on 08/24/20.

## 2020-09-02 ENCOUNTER — Other Ambulatory Visit: Payer: Self-pay

## 2020-09-02 ENCOUNTER — Ambulatory Visit (HOSPITAL_COMMUNITY): Payer: BC Managed Care – PPO | Attending: Cardiology

## 2020-09-02 DIAGNOSIS — I255 Ischemic cardiomyopathy: Secondary | ICD-10-CM | POA: Diagnosis not present

## 2020-09-02 DIAGNOSIS — R5383 Other fatigue: Secondary | ICD-10-CM | POA: Diagnosis not present

## 2020-09-02 DIAGNOSIS — R0609 Other forms of dyspnea: Secondary | ICD-10-CM

## 2020-09-02 DIAGNOSIS — R0600 Dyspnea, unspecified: Secondary | ICD-10-CM | POA: Insufficient documentation

## 2020-09-02 LAB — ECHOCARDIOGRAM COMPLETE
Area-P 1/2: 3.27 cm2
S' Lateral: 3.3 cm

## 2020-09-03 ENCOUNTER — Ambulatory Visit (INDEPENDENT_AMBULATORY_CARE_PROVIDER_SITE_OTHER): Payer: BC Managed Care – PPO

## 2020-09-03 DIAGNOSIS — R5383 Other fatigue: Secondary | ICD-10-CM | POA: Diagnosis not present

## 2020-09-03 DIAGNOSIS — R42 Dizziness and giddiness: Secondary | ICD-10-CM

## 2020-09-03 DIAGNOSIS — R0600 Dyspnea, unspecified: Secondary | ICD-10-CM

## 2020-09-03 DIAGNOSIS — R0609 Other forms of dyspnea: Secondary | ICD-10-CM

## 2020-09-07 NOTE — Telephone Encounter (Signed)
PA submitted via [place] Name on insurance card does not match name in chart (Key: [id number]) Approved - effective from 09/07/2020 through 09/06/2021

## 2020-09-08 NOTE — Telephone Encounter (Signed)
Attempted to contact patient on both his and his wife's number - voicemails are either not set up or full MyChart message sent [facility] on [facility]/26/2020

## 2020-09-29 NOTE — Progress Notes (Deleted)
Cardiology Clinic Note   Patient Name: Kenneth [name] Date of Encounter: 09/29/2020  Primary Care Provider:  Curly Rim, MD Primary Cardiologist:  Pixie Casino, MD  Patient Profile    Kenneth [name] presents to the clinic today for a follow-up evaluation of his fatigue, and shortness of breath.    Past Medical History    Past Medical History:  Diagnosis Date  . Arthritis   . CAD (coronary artery disease)    a. 02/2014 Abnl MV with defect in LCX territory;  b. 02/2014 Cath: LAD 60-70p, 39m, LCX 100 CTO, OM1 95p, 99d, RCA 100 CTO w/ L->R collaterals; c. 02/2014 CABG x 4 (LIMA->LAD, VG->OM, VG->RPDA->RPL); d. 01/2016 MV: 41%, med size, sev intensity fixed basal inferior lateral defect w/o ischemia.  Marland Kitchen CAD (coronary artery disease)   . Carotid arterial disease ([place])    a. 06/2013 Carotid U/S: moderate narrowing of both subclavian arteries, with normal carotid arteries;  b. 02/2014 Carotid U/S: 1-39% bilat ICA stenosis.  . Depression   . Fracture    nasal bone  . History of echocardiogram    a. 02/2016 Echo: EF 50-55%, no rwma, mildly dil LA, nl RV fxn, mildly dil RA.  Marland Kitchen History of kidney stones   . Hyperlipidemia   . Hypertension   . Hypertensive heart disease   . PONV (postoperative nausea and vomiting)   . Syndrome X, cardiac ([place])    a. Previously felt to have microvascular angina following remote cath in [place]-->treated with nitrates, ranexa, EECP.  Later found to have multivessel CAD.  Marland Kitchen Wears glasses    Past Surgical History:  Procedure Laterality Date  . CARDIAC CATHETERIZATION  2005  . CLOSED REDUCTION NASAL FRACTURE N/A 03/27/2020   Procedure: CLOSED REDUCTION NASAL BONES;  Surgeon: Irene Limbo, MD;  Location: [facility];  Service: Plastics;  Laterality: N/A;  . COLONOSCOPY    . CORONARY ARTERY BYPASS GRAFT N/A 03/18/2014   Procedure: CORONARY ARTERY BYPASS GRAFTING (CABG) x 4 using endoscopically harvested right saphenous vein and left  internal mammary artery;  Surgeon: Gaye Pollack, MD;  Location: [facility] OR;  Service: Open Heart Surgery;  Laterality: N/A;  . CORONARY ARTERY BYPASS GRAFT    . HAND SURGERY Left   . HIP ARTHROPLASTY    . INTRAOPERATIVE TRANSESOPHAGEAL ECHOCARDIOGRAM N/A 03/18/2014   Procedure: INTRAOPERATIVE TRANSESOPHAGEAL ECHOCARDIOGRAM;  Surgeon: Gaye Pollack, MD;  Location: [facility] OR;  Service: Open Heart Surgery;  Laterality: N/A;  . KIDNEY STONE SURGERY  age 5  . LEFT HEART CATHETERIZATION WITH CORONARY ANGIOGRAM N/A 03/14/2014   Procedure: LEFT HEART CATHETERIZATION WITH CORONARY ANGIOGRAM;  Surgeon: Troy Sine, MD;  Location: [facility] CATH LAB;  Service: Cardiovascular;  Laterality: N/A;  . SHOULDER ARTHROSCOPY  2001   L shoulder  . SHOULDER SURGERY    . TOTAL HIP ARTHROPLASTY Left 10/26/2016   Procedure: LEFT TOTAL HIP ARTHROPLASTY ANTERIOR APPROACH;  Surgeon: Gaynelle Arabian, MD;  Location: [facility] ORS;  Service: Orthopedics;  Laterality: Left;    Allergies  Allergies  Allergen Reactions  . Tetracyclines & Related Nausea And Vomiting    History of Present Illness    Kenneth [name] has a PMH of HTN, coronary artery disease (2015 multivessel disease), CABG x4 LIMA-LAD, SVG-OM, SVG to RPDA, and RPL.  His left ventricular ejection fraction at that time was 41% with an inferior deficit.  His cardiac catheterization showed chronic total occlusion of his RCA with left to right collaterals.  Subsequently  his EF improved to 50-55%.  January 2019 he experienced hypertension and chest discomfort.  He underwent repeat echocardiogram which showed an EF of 40-45% with inferior akinesis, his stress test showed an EF of 46% with fixed inferior defect suggestive of scar with no reversible ischemia.  With medication adjustment his blood pressure improved.  His PMH also includes chronic systolic heart failure, osteoarthritis of the hip, dyslipidemia, obesity, and depression.  His last seen by Dr. Debara Pickett on 01/09/2020.  During  that time he continued to have difficulty with hypertension.  He was continued on Repatha and was tolerating that well.  He reported that he did get fatigued and occasionally short of breath.  He reported anxiousness and panic attacks.  He was trying to come off of his ADHD medicine to see if that would help.  He was also meeting with a psychiatrist.  He contacted nurse triage line on 08/19/2020 and indicated that he had 2 weeks of fatigue, shortness of breath and double vision.  When he presented to PCP on 08/18/2020 his blood pressure was 130/80 and heart rate was in the 70s.  He presented to the clinic 08/24/20 for evaluation with his wife and stated over the last month he had lost around 15 pounds. He had also had intermittent bouts of nausea, double vision, and poor appetite. He stated that he stopped his metoprolol succinate around 2 months ago. He felt that the medication was making him weak and giving him muscle aches. He had also noticed some memory deficits where he would not remember conversations that he was having with individuals and forget entire days. He stated that he had not been hydrating well and also noticed dizziness. His dizziness came on with increased physical activity. He stated that it was usually associated with changes in his heart rate. He presented to his PCP who prescribed meclizine. He indicated that over the past few days he had not had any double vision.  However, he felt like he  had similar symptoms for up to a year. He was only able to complete about 2-3 hours of physical activity daily before noticing  fatigued. I will ordered an echocardiogram, 7-day ZIO monitor, BMP, CBC, lipid panel, and have him follow-up in 6 weeks. I  also refered to neurology for follow-up evaluation of his memory loss. I  instructed him to increase his p.o. hydration and monitor his blood pressure.  His echocardiogram showed an LVEF of 55-60%, G1 DD, and no significant valvular abnormalities.  His  total cholesterol was 346 with triglycerides of 221 and an LDL of 257.  He was not taking his Repatha secondary to insurance issues.  He was referred back to lipid clinic who received approval for Repatha until 09/06/2021.  He presents to the clinic today for follow-up evaluation states***   Today he denies chest pain, shortness of breath, lower extremity edema, fatigue, palpitations, melena, hematuria, hemoptysis, diaphoresis, weakness, presyncope, syncope, orthopnea, and PND.  Home Medications    Prior to Admission medications   Medication Sig Start Date End Date Taking? Authorizing Provider  atorvastatin (LIPITOR) 80 MG tablet Take 1 tablet (80 mg total) by mouth at bedtime. 08/24/20   Deberah Pelton, NP  DULoxetine (CYMBALTA) 60 MG capsule Take 60 mg by mouth daily.  09/11/19 09/10/20  [provider]  Evolocumab (REPATHA SURECLICK) 875 MG/ML SOAJ Inject 1 Dose into the skin every 14 (fourteen) days. INJECT 140 MG INTO THE SKIN EVERY 14 (FOURTEEN) DAYS. 08/26/20   Hilty,  Nadean Corwin, MD  ezetimibe (ZETIA) 10 MG tablet TAKE 1 TABLET BY MOUTH EVERY DAY 06/03/20   Hilty, Nadean Corwin, MD  hydrochlorothiazide (HYDRODIURIL) 25 MG tablet Take 25 mg by mouth daily.  09/11/19   [provider]  lisinopril (ZESTRIL) 40 MG tablet Take 1 tablet (40 mg total) by mouth daily. 04/17/20 08/24/20  Hilty, Nadean Corwin, MD  ondansetron (ZOFRAN ODT) 8 MG disintegrating tablet Take 1 tablet (8 mg total) by mouth every 8 (eight) hours as needed. 8mg  ODT q4 hours prn nausea 03/20/20   Ripley Fraise, MD  sacubitril-valsartan (ENTRESTO) 49-51 MG Take 1 tablet by mouth 2 (two) times daily. 01/09/20   Hilty, Nadean Corwin, MD  sildenafil (VIAGRA) 50 MG tablet TAKE 1 TABLET BY MOUTH DAILY AS NEEDED FOR ERECTILE DYSFUNCTION 04/17/20   Pixie Casino, MD  testosterone cypionate (DEPOTESTOSTERONE CYPIONATE) 200 MG/ML injection Inject 300 mg into the muscle every Wednesday.  01/05/16   [provider]    Family  History    Family History  Problem Relation Age of Onset  . Heart attack Mother   . Stroke Mother   . Diabetes Mother   . Heart attack Father   . Cancer Father        lung   He indicated that his mother is deceased. He indicated that his father is deceased. He indicated that his sister is alive. He indicated that his son is alive.  Social History    Social History   Socioeconomic History  . Marital status: Married    Spouse name: Not on file  . Number of children: Not on file  . Years of education: Not on file  . Highest education level: Not on file  Occupational History  . Not on file  Tobacco Use  . Smoking status: Former Smoker    Types: Cigars    Quit date: 06/26/2012    Years since quitting: 8.2  . Smokeless tobacco: Never Used  Vaping Use  . Vaping Use: Never used  Substance and Sexual Activity  . Alcohol use: Not Currently  . Drug use: Not Currently  . Sexual activity: Yes    Birth control/protection: None  Other Topics Concern  . Not on file  Social History Narrative   ** Merged History Encounter **       Social Determinants of Health   Financial Resource Strain:   . Difficulty of Paying Living Expenses: Not on file  Food Insecurity:   . Worried About Charity fundraiser in the Last Year: Not on file  . Ran Out of Food in the Last Year: Not on file  Transportation Needs:   . Lack of Transportation (Medical): Not on file  . Lack of Transportation (Non-Medical): Not on file  Physical Activity:   . Days of Exercise per Week: Not on file  . Minutes of Exercise per Session: Not on file  Stress:   . Feeling of Stress : Not on file  Social Connections:   . Frequency of Communication with Friends and Family: Not on file  . Frequency of Social Gatherings with Friends and Family: Not on file  . Attends Religious Services: Not on file  . Active Member of Clubs or Organizations: Not on file  . Attends Archivist Meetings: Not on file  . Marital  Status: Not on file  Intimate Partner Violence:   . Fear of Current or Ex-Partner: Not on file  . Emotionally Abused: Not on file  .  Physically Abused: Not on file  . Sexually Abused: Not on file     Review of Systems    General:  No chills, fever, night sweats or weight changes.  Cardiovascular:  No chest pain, dyspnea on exertion, edema, orthopnea, palpitations, paroxysmal nocturnal dyspnea. Dermatological: No rash, lesions/masses Respiratory: No cough, dyspnea Urologic: No hematuria, dysuria Abdominal:   No nausea, vomiting, diarrhea, bright red blood per rectum, melena, or hematemesis Neurologic:  No visual changes, wkns, changes in mental status. All other systems reviewed and are otherwise negative except as noted above.  Physical Exam    VS:  There were no vitals taken for this visit. , BMI There is no height or weight on file to calculate BMI. GEN: Well nourished, well developed, in no acute distress. HEENT: normal. Neck: Supple, no JVD, carotid bruits, or masses. Cardiac: RRR, no murmurs, rubs, or gallops. No clubbing, cyanosis, edema.  Radials/DP/PT 2+ and equal bilaterally.  Respiratory:  Respirations regular and unlabored, clear to auscultation bilaterally. GI: Soft, nontender, nondistended, BS + x 4. MS: no deformity or atrophy. Skin: warm and dry, no rash. Neuro:  Strength and sensation are intact. Psych: Normal affect.  Accessory Clinical Findings    Recent Labs: 03/20/2020: ALT 34 08/24/2020: BUN 20; Creatinine, Ser 1.25; Hemoglobin 16.8; Platelets 276; Potassium 4.0; Sodium 139   Recent Lipid Panel    Component Value Date/Time   CHOL 346 (H) 08/24/2020 0905   CHOL 184 08/18/2015 1104   TRIG 221 (H) 08/24/2020 0905   TRIG 143 08/18/2015 1104   HDL 43 08/24/2020 0905   HDL 28 (L) 08/18/2015 1104   CHOLHDL 8.0 (H) 08/24/2020 0905   CHOLHDL 5.5 03/13/2014 1100   VLDL 22 03/13/2014 1100   LDLCALC 257 (H) 08/24/2020 0905   LDLCALC 127 (H) 08/18/2015 1104      ECG personally reviewed by me today- *** - No acute changes  EKG 03/20/2020 Sinus rhythm left atrial enlargement left axis deviation 74 bpm  Echocardiogram 09/02/2020 IMPRESSIONS    1. Left ventricular ejection fraction, by estimation, is 55 to 60%. The  left ventricle has normal function. The left ventricle has no regional  wall motion abnormalities. There is moderate left ventricular hypertrophy.  Left ventricular diastolic  parameters are consistent with Grade I diastolic dysfunction (impaired  relaxation).  2. Right ventricular systolic function is normal. The right ventricular  size is normal.  3. The mitral valve is normal in structure. Trivial mitral valve  regurgitation. No evidence of mitral stenosis.  4. The aortic valve is normal in structure. Aortic valve regurgitation is  not visualized. No aortic stenosis is present.  5. The inferior vena cava is normal in size with greater than 50%  respiratory variability, suggesting right atrial pressure of 3 mmHg.   Comparison(s): No significant change from prior study. Prior images  reviewed side by side.   Assessment & Plan   1.  Fatigue/DOE/palpitations -has begun to get somewhat better over the last 2 weeks.  Denies rapid heart rate palpitations, presyncope and syncope.  Echocardiogram 09/02/2020 showed normal LVEF, G1 DD, and no significant valvular abnormalities.  7-day ZIO monitor showed*** Heart healthy low-sodium diet-salty 6 given Increase physical activity as tolerated Continue increased caloric intake, p.o. hydration   Ischemic cardiomyopathy-continues to have  activity intolerance and DOE.  Echocardiogram 09/02/2020 showed normal LVEF, G1 DD, and no significant valvular abnormalities. Continue Entresto,  lisinopril, HCTZ Heart healthy low-sodium diet-salty 6 given Increase physical activity as tolerated  Coronary artery disease-no  chest pain today.  Status post four-vessel CABG 3/15 (LIMA-LAD, SVG-OM, SVG  PDA, and SVG to RPL. Continue atorvastatin, lisinopril, hydrochlorothiazide Heart healthy low-sodium diet-salty 6 given Increase physical activity as tolerated   Essential hypertension-BP today 110/98***.  Well-controlled at home Continue HCTZ, lisinopril, Entresto Heart healthy low-sodium diet-salty 6 gien Increase physical activity as tolerated  Familial hyperlipidemia-LDL 204 on 01/24/2018 Continue  lipitor, zetia Restarted on Repatha Increase physical activity as tolerated Heart healthy low-sodium high-fiber diet  Memory loss-***over the last several weeks he has noticed intermittent periods of memory loss. Had CVA in April with trauma to his face. Consult neurology  Disposition: Follow-up with  Dr. Debara Pickett in 3 months.  Kenneth Ng. Binnie Vonderhaar NP-C    09/29/2020, 9:44 AM [place] [address] Office [phone] Fax [phone]  Notice: This dictation was prepared with Dragon dictation along with smaller phrase technology. Any transcriptional errors that result from this process are unintentional and may not be corrected upon review.

## 2020-09-30 ENCOUNTER — Telehealth: Payer: Self-pay

## 2020-09-30 ENCOUNTER — Ambulatory Visit: Payer: BC Managed Care – PPO | Admitting: General Practice

## 2020-09-30 NOTE — Telephone Encounter (Signed)
Tried to call pt, no VM Pt has an appt scheduled to review Zio monitor results. It does not look like he has mailed it back.

## 2020-09-30 NOTE — Telephone Encounter (Signed)
Tried to call again-to discuss today's appt/monitor  LM-for wife to call pt and have him call us about his appt

## 2020-10-01 NOTE — Telephone Encounter (Signed)
Pt called back 10-6, he states that he has not mailed monitor back. There is no need to have appt today we will reschedule appt until after monitor is uploaded. Rescheduled appointment

## 2020-10-07 ENCOUNTER — Other Ambulatory Visit: Payer: Self-pay | Admitting: *Deleted

## 2020-10-07 ENCOUNTER — Other Ambulatory Visit: Payer: Self-pay | Admitting: General Practice

## 2020-10-07 DIAGNOSIS — R5383 Other fatigue: Secondary | ICD-10-CM

## 2020-10-07 DIAGNOSIS — R42 Dizziness and giddiness: Secondary | ICD-10-CM

## 2020-10-07 DIAGNOSIS — R0600 Dyspnea, unspecified: Secondary | ICD-10-CM

## 2020-10-07 DIAGNOSIS — R0609 Other forms of dyspnea: Secondary | ICD-10-CM

## 2020-10-22 NOTE — Progress Notes (Unsigned)
{Choose 1 Note Type (Telehealth Visit or Telephone Visit):[phone]}  Evaluation Performed:  Follow-up visit  This visit type was conducted due to national recommendations for restrictions regarding the COVID-19 Pandemic (e.g. social distancing).  This format is felt to be most appropriate for this patient at this time.  All issues noted in this document were discussed and addressed.  No physical exam was performed (except for noted visual exam findings with Video Visits).  Please refer to the patient's chart (MyChart message for video visits and phone note for telephone visits) for the patient's consent to telehealth for [place]  Date:  10/22/2020   ID:  Kenneth [name], DOB [birth year], MRN [id number]  Patient Location: *** [address] [place] [address]   Provider location:     [facility] [address] Office [phone] Fax [phone]   PCP:  Curly Rim, MD  Cardiologist:  Pixie Casino, MD *** Electrophysiologist:  None   Chief Complaint: Follow-up for fatigue/shortness of breath  History of Present Illness:    Kenneth [name] is a 54 y.o. male who presents via audio/video conferencing for a telehealth visit today.  Patient verified DOB and address.  [name] has a PMH of HTN, coronary artery disease (2015 multivessel disease), CABG x4 LIMA-LAD, SVG-OM, SVG to RPDA, and RPL.  His left ventricular ejection fraction at that time was 41% with an inferior deficit.  His cardiac catheterization showed chronic total occlusion of his RCA with left to right collaterals.  Subsequently his EF improved to 50-55%.  January 2019 he experienced hypertension and chest discomfort.  He underwent repeat echocardiogram which showed an EF of 40-45% with inferior akinesis, his stress test showed an EF of 46% with fixed inferior defect suggestive of scar with no reversible ischemia.  With medication  adjustment his blood pressure improved.  His PMH also includes chronic systolic heart failure, osteoarthritis of the hip, dyslipidemia, obesity, and depression.  His last seen by Dr. Debara Pickett on 01/09/2020.  During that time he continued to have difficulty with hypertension.  He was continued on Repatha and was tolerating that well.  He reported that he did get fatigued and occasionally short of breath.  He reported anxiousness and panic attacks.  He was trying to come off of his ADHD medicine to see if that would help.  He was also meeting with a psychiatrist.  He contacted nurse triage line on 08/19/2020 and indicated that he had 2 weeks of fatigue, shortness of breath and double vision.  When he presented to PCP on 08/18/2020 his blood pressure was 130/80 and heart rate was in the 70s.  He presented to the clinic 08/24/2020 for evaluation with his wife and stated over the last month he had lost around 15 pounds. He had intermittent bouts of nausea, double vision, and poor appetite. He stated that he stopped his metoprolol succinate around 2 months ago. He felt that this medication was making him weak and giving him muscle aches. He  noticed some memory deficits where he would not remember conversations that he was having with individuals and forget entire days. He stated that he had not been hydrating well and also noticed dizziness. His dizziness  Increased with physical activity. He stated that this was  usually associated with changes in his heart rate. He presented to his PCP who prescribed meclizine. He indicated that over the past few days he had not had any  double vision. However, he felt like he  had similar symptoms for up to a year. He was only able to complete about 2-3 hours of physical activity daily before noticing  fatigued. I  ordered an echocardiogram, 7-day ZIO monitor, BMP, CBC, lipid panel, and planned follow-up in 6 weeks. I  refered to neurology for follow-up evaluation of his memory  loss. I  instructed him to increase his p.o. hydration and monitor his blood pressure.   His total cholesterol was 346 with LDL cholesterol of 257.  He was contacted and indicated that he had not been taking Repatha.  He was referred back to the lipid clinic.  Other lab work remained fairly stable.  His echocardiogram showed an EF of 50-60%, G1 DD, trivial mitral valve regurgitation and no change from his previous study.  He presents the clinic today for follow-up evaluation states***  Today he denies chest pain, shortness of breath, lower extremity edema, fatigue, palpitations, melena, hematuria, hemoptysis, diaphoresis, weakness, presyncope, syncope, orthopnea, and PND.   The patient {does/does not:200015} symptoms concerning for COVID-19 infection (fever, chills, cough, or new SHORTNESS OF BREATH).    Prior CV studies:   The following studies were reviewed today:  Echocardiogram 09/02/2020  IMPRESSIONS    1. Left ventricular ejection fraction, by estimation, is 55 to 60%. The  left ventricle has normal function. The left ventricle has no regional  wall motion abnormalities. There is moderate left ventricular hypertrophy.  Left ventricular diastolic  parameters are consistent with Grade I diastolic dysfunction (impaired  relaxation).  2. Right ventricular systolic function is normal. The right ventricular  size is normal.  3. The mitral valve is normal in structure. Trivial mitral valve  regurgitation. No evidence of mitral stenosis.  4. The aortic valve is normal in structure. Aortic valve regurgitation is  not visualized. No aortic stenosis is present.  5. The inferior vena cava is normal in size with greater than 50%  respiratory variability, suggesting right atrial pressure of 3 mmHg.   Comparison(s): No significant change from prior study. Prior images  reviewed side by side.   Cardiac event monitor 10/07/2020 Predominantly sinus rhythm with rare PACs and PVCs no  significant arrhythmias noted.  Past Medical History:  Diagnosis Date  . Arthritis   . CAD (coronary artery disease)    a. 02/2014 Abnl MV with defect in LCX territory;  b. 02/2014 Cath: LAD 60-70p, 40m, LCX 100 CTO, OM1 95p, 99d, RCA 100 CTO w/ L->R collaterals; c. 02/2014 CABG x 4 (LIMA->LAD, VG->OM, VG->RPDA->RPL); d. 01/2016 MV: 41%, med size, sev intensity fixed basal inferior lateral defect w/o ischemia.  Marland Kitchen CAD (coronary artery disease)   . Carotid arterial disease ([place])    a. 06/2013 Carotid U/S: moderate narrowing of both subclavian arteries, with normal carotid arteries;  b. 02/2014 Carotid U/S: 1-39% bilat ICA stenosis.  . Depression   . Fracture    nasal bone  . History of echocardiogram    a. 02/2016 Echo: EF 50-55%, no rwma, mildly dil LA, nl RV fxn, mildly dil RA.  Marland Kitchen History of kidney stones   . Hyperlipidemia   . Hypertension   . Hypertensive heart disease   . PONV (postoperative nausea and vomiting)   . Syndrome X, cardiac ([place])    a. Previously felt to have microvascular angina following remote cath in [place]-->treated with nitrates, ranexa, EECP.  Later found to have multivessel CAD.  Marland Kitchen Wears glasses    Past Surgical History:  Procedure Laterality Date  .  CARDIAC CATHETERIZATION  2005  . CLOSED REDUCTION NASAL FRACTURE N/A 03/27/2020   Procedure: CLOSED REDUCTION NASAL BONES;  Surgeon: Irene Limbo, MD;  Location: [facility];  Service: Plastics;  Laterality: N/A;  . COLONOSCOPY    . CORONARY ARTERY BYPASS GRAFT N/A 03/18/2014   Procedure: CORONARY ARTERY BYPASS GRAFTING (CABG) x 4 using endoscopically harvested right saphenous vein and left internal mammary artery;  Surgeon: Gaye Pollack, MD;  Location: [facility] OR;  Service: Open Heart Surgery;  Laterality: N/A;  . CORONARY ARTERY BYPASS GRAFT    . HAND SURGERY Left   . HIP ARTHROPLASTY    . INTRAOPERATIVE TRANSESOPHAGEAL ECHOCARDIOGRAM N/A 03/18/2014   Procedure: INTRAOPERATIVE TRANSESOPHAGEAL ECHOCARDIOGRAM;  Surgeon: Gaye Pollack, MD;  Location[facility] OR;  Service: Open Heart Surgery;  Laterality: N/A;  . KIDNEY STONE SURGERY  age 71  . LEFT HEART CATHETERIZATION WITH CORONARY ANGIOGRAM N/A 03/14/2014   Procedure: LEFT HEART CATHETERIZATION WITH CORONARY ANGIOGRAM;  Surgeon: Troy Sine, MD;  Location: [facility] CATH LAB;  Service: Cardiovascular;  Laterality: N/A;  . SHOULDER ARTHROSCOPY  2001   L shoulder  . SHOULDER SURGERY    . TOTAL HIP ARTHROPLASTY Left 10/26/2016   Procedure: LEFT TOTAL HIP ARTHROPLASTY ANTERIOR APPROACH;  Surgeon: Gaynelle Arabian, MD;  Location: [facility] ORS;  Service: Orthopedics;  Laterality: Left;     No outpatient medications have been marked as taking for the 10/23/20 encounter (Appointment) with Deberah Pelton, NP.     Allergies:   Tetracyclines & related   Social History   Tobacco Use  . Smoking status: Former Smoker    Types: Cigars    Quit date: 06/26/2012    Years since quitting: 8.3  . Smokeless tobacco: Never Used  Vaping Use  . Vaping Use: Never used  Substance Use Topics  . Alcohol use: Not Currently  . Drug use: Not Currently     Family Hx: The patient's family history includes Cancer in his father; Diabetes in his mother; Heart attack in his father and mother; Stroke in his mother.  ROS:   Please see the history of present illness.     All other systems reviewed and are negative.   Labs/Other Tests and Data Reviewed:    Recent Labs: 03/20/2020: ALT 34 08/24/2020: BUN 20; Creatinine, Ser 1.25; Hemoglobin 16.8; Platelets 276; Potassium 4.0; Sodium 139   Recent Lipid Panel Lab Results  Component Value Date/Time   CHOL 346 (H) 08/24/2020 09:05 AM   CHOL 184 08/18/2015 11:04 AM   TRIG 221 (H) 08/24/2020 09:05 AM   TRIG 143 08/18/2015 11:04 AM   HDL 43 08/24/2020 09:05 AM   HDL 28 (L) 08/18/2015 11:04 AM   CHOLHDL 8.0 (H) 08/24/2020 09:05 AM   CHOLHDL 5.5 03/13/2014 11:00 AM   LDLCALC 257 (H) 08/24/2020 09:05 AM   LDLCALC 127 (H) 08/18/2015 11:04 AM    Wt  Readings from Last 3 Encounters:  08/24/20 222 lb 12.8 oz (101.1 kg)  03/27/20 235 lb (106.6 kg)  03/20/20 235 lb (106.6 kg)     Exam:    Vital Signs:  There were no vitals taken for this visit.   Well nourished, well developed male in no  acute distress.   ASSESSMENT & PLAN:    1.  Fatigue/DOE/palpitations -continues to improve.  Echocardiogram showed EF 50-60% trivial mitral valve regurgitation.  Cardiac event monitor showed sinus rhythm with PACs and PVCs.  CBC BMP stable Heart healthy low-sodium diet-salty 6 given Increase physical activity  as tolerated Increase caloric intake, p.o. hydration   Ischemic cardiomyopathy-continues with  activity intolerance and DOE.    Repeat echocardiogram 09/02/2020 showed EF 50-60% trivial mitral valve regurgitation, G1 DD.  Echocardiogram 2/19 showed an EF of 40-45%, G1 DD Continue Entresto,  lisinopril, HCTZ Heart healthy low-sodium diet-salty 6 given Increase physical activity as tolerated  Coronary artery disease-continue as well no chest pain today.  Status post four-vessel CABG 3/15 (LIMA-LAD, SVG-OM, SVG PDA, and SVG to RPL. Continue atorvastatin, lisinopril, hydrochlorothiazide Heart healthy low-sodium diet-salty 6 given Increase physical activity as tolerated   Essential hypertension-BP today ***110/98.  Well-controlled at home Continue HCTZ, lisinopril, Entresto Heart healthy low-sodium diet-salty 6 gien Increase physical activity as tolerated  Familial hyperlipidemia-08/24/2020: Cholesterol, Total 346; HDL 43; LDL Chol Calc (NIH) 257; Triglycerides 221 referred back to lipid clinic for authorization of Repatha.  Patient had discontinued due to insurance issues.  He is following up with Dr. Debara Pickett Continue lipitor, zetia Increase physical activity as tolerated Heart healthy low-sodium high-fiber diet  Memory loss-patient previously stated over the last several weeks he had noticed intermittent periods of memory loss. Had CVA  in April with trauma to his face. Consult neurology-appointment scheduled for 11/11/2020  Disposition: Follow-up with  Dr. Debara Pickett in 3 months.  COVID-19 Education: The signs and symptoms of COVID-19 were discussed with the patient and how to seek care for testing (follow up with PCP or arrange E-visit).  The importance of social distancing was discussed today.  Patient Risk:   After full review of this patients clinical status, I feel that they are at least moderate risk at this time.  Time:   Today, I have spent *** minutes with the patient with telehealth technology discussing ***.     Medication Adjustments/Labs and Tests Ordered: Current medicines are reviewed at length with the patient today.  Concerns regarding medicines are outlined above.   Tests Ordered: No orders of the defined types were placed in this encounter.  Medication Changes: No orders of the defined types were placed in this encounter.   Disposition:  {follow up:15908}  Signed, Jossie Ng. Fabiano Ginley NP-C    07/30/2019 11:58 AM    [address] Office [phone] Fax [phone]

## 2020-10-23 ENCOUNTER — Telehealth: Payer: BC Managed Care – PPO | Admitting: General Practice

## 2020-10-23 ENCOUNTER — Telehealth: Payer: Self-pay

## 2020-10-23 NOTE — Telephone Encounter (Signed)
Tried to call pt for his virtual appointment x2. [id number].

## 2020-10-23 NOTE — Telephone Encounter (Signed)
UNABLE TO CONTACT PT. NO SHOW FOR VITUAL APPT

## 2020-11-11 ENCOUNTER — Ambulatory Visit: Payer: No Typology Code available for payment source | Admitting: Neurology

## 2020-11-12 ENCOUNTER — Encounter: Payer: Self-pay | Admitting: Internal Medicine

## 2020-11-12 ENCOUNTER — Ambulatory Visit (INDEPENDENT_AMBULATORY_CARE_PROVIDER_SITE_OTHER): Payer: BC Managed Care – PPO | Admitting: Neurology

## 2020-11-12 ENCOUNTER — Encounter: Payer: Self-pay | Admitting: Neurology

## 2020-11-12 VITALS — BP 119/74 | HR 73 | Ht 69.0 in | Wt 220.0 lb

## 2020-11-12 DIAGNOSIS — R6889 Other general symptoms and signs: Secondary | ICD-10-CM | POA: Diagnosis not present

## 2020-11-12 DIAGNOSIS — R0683 Snoring: Secondary | ICD-10-CM | POA: Diagnosis not present

## 2020-11-12 DIAGNOSIS — R413 Other amnesia: Secondary | ICD-10-CM

## 2020-11-12 DIAGNOSIS — R7989 Other specified abnormal findings of blood chemistry: Secondary | ICD-10-CM

## 2020-11-12 DIAGNOSIS — R351 Nocturia: Secondary | ICD-10-CM

## 2020-11-12 DIAGNOSIS — G4719 Other hypersomnia: Secondary | ICD-10-CM | POA: Diagnosis not present

## 2020-11-12 DIAGNOSIS — D352 Benign neoplasm of pituitary gland: Secondary | ICD-10-CM
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
## 2020-11-12 NOTE — Patient Instructions (Addendum)
You have complaints of memory loss: memory loss or changes in cognitive function can have many reasons and does not always mean you have dementia. Conditions that can contribute to subjective or objective memory loss include: depression, stress, poor sleep from insomnia or sleep apnea, dehydration, fluctuation in blood sugar values, thyroid or electrolyte dysfunction and certain vitamin deficiencies. Dementia can be caused by stroke, brain atherosclerosis or brain vascular disease due to vascular risk factors (smoking, high blood pressure, high cholesterol, obesity and uncontrolled diabetes), certain degenerative brain disorders (including Parkinson's disease and Multiple sclerosis) and by Alzheimer's disease or other, more rare and sometimes hereditary causes.   We will do some additional testing:   Due to your complaint of intermittent lapses of memory, we will proceed with an EEG (brainwave test), which we will schedule. We will call you with the results.  We will do blood work and we will do a brain scan. We may request a formal cognitive test called neuropsychological evaluation which is done by a licensed neuropsychologist. We can make a referral in that regard. We will call you with brain scan test results and monitor your symptoms. Your memory loss is rather mild at this point, which, of course is reassuring.   We will also proceed with sleep study testing to rule out underlying obstructive sleep apnea.  If you have obstructive sleep apnea I will likely ask you to try a CPAP or a so-called AutoPap machine for treatment.  We will call you to schedule your sleep test and also with the results.

## 2020-11-16 ENCOUNTER — Telehealth: Payer: Self-pay | Admitting: Neurology

## 2020-11-16 ENCOUNTER — Encounter: Payer: Self-pay | Admitting: Neurology

## 2020-11-16 ENCOUNTER — Telehealth: Payer: Self-pay

## 2020-11-16 NOTE — Telephone Encounter (Signed)
-----   Message from Star Age, MD sent at 11/16/2020  8:54 AM EST ----- Please call patient and advise him that his blood test results were mostly benign with his thiamine level pending, we can certainly update when it is back, will typically call if abnormal. But kidney function was mildly impaired.  Creatinine is 1.33, elevated from 08/24/2020 but compared to March 2021 it is a little better.  All in all, he has mild kidney dysfunction and I would like for him to be in close follow-up with his primary care physician regarding this.  I am not sure if his creatinine level is good enough for doing a brain MRI with and without contrast.  I may have to change the MRI request to without contrast only.    Raquel Sarna, is there a possibility for you to check with the radiology department for their policy regarding maximum allowable creatinine level and min. GFR?

## 2020-11-16 NOTE — Telephone Encounter (Signed)
I called pt to discuss. No answer, left a message asking him to call me back. 

## 2020-11-16 NOTE — Telephone Encounter (Signed)
LVM for pt to call back about scheduling mri  bcbs auth: [id number] (exp. 11/16/20 to 05/14/21)

## 2020-11-16 NOTE — Telephone Encounter (Signed)
-----   Message from Star Age, MD sent at 11/16/2020  4:55 PM EST ----- Raquel Sarna: He has a history of kidney stones but no known history of kidney disease and is not on dialysis.  Shakti Fleer: would you call him back and advise him that we can still give contrast with an elevated creatinine level but I would really encourage him to meet with his primary care physician to discuss kidney function and ongoing surveillance of his kidneys.  If he is okay with proceeding the brain MRI with contrast we can pursue this, if he feels more comfortable doing the MRI without contrast I can change it to MRI brain without contrast only if he would prefer.

## 2020-11-16 NOTE — Telephone Encounter (Signed)
Patient returned my call he is scheduled for 11/24/20.

## 2020-11-16 NOTE — Progress Notes (Signed)
Kenneth [name]: He has a history of kidney stones but no known history of kidney disease and is not on dialysis.  Kristen: would you call him back and advise him that we can still give contrast with an elevated creatinine level but I would really encourage him to meet with his primary care physician to discuss kidney function and ongoing surveillance of his kidneys.  If he is okay with proceeding the brain MRI with contrast we can pursue this, if he feels more comfortable doing the MRI without contrast I can change it to MRI brain without contrast only if he would prefer.

## 2020-11-16 NOTE — Progress Notes (Signed)
Please call patient and advise him that his blood test results were mostly benign with his thiamine level pending, we can certainly update when it is back, will typically call if abnormal. But kidney function was mildly impaired.  Creatinine is 1.33, elevated from 08/24/2020 but compared to March 2021 it is a little better.  All in all, he has mild kidney dysfunction and I would like for him to be in close follow-up with his primary care physician regarding this.  I am not sure if his creatinine level is good enough for doing a brain MRI with and without contrast.  I may have to change the MRI request to without contrast only.    Raquel Sarna, is there a possibility for you to check with the radiology department for their policy regarding maximum allowable creatinine level and min. GFR?

## 2020-11-17 NOTE — Telephone Encounter (Signed)
Right now the patient MRI Brain w/wo contrast is scheduled at [facility] for 11/24/20.

## 2020-11-17 NOTE — Telephone Encounter (Signed)
I called pt again to discuss. No answer, left a message asking him to call me back. 

## 2020-11-18 LAB — COMPREHENSIVE METABOLIC PANEL
ALT: 21 IU/L (ref 0–44)
AST: 17 IU/L (ref 0–40)
Albumin/Globulin Ratio: 1.5 (ref 1.2–2.2)
Albumin: 4.3 g/dL (ref 3.8–4.9)
Alkaline Phosphatase: 66 IU/L (ref 44–121)
BUN/Creatinine Ratio: 17 (ref 9–20)
BUN: 23 mg/dL (ref 6–24)
Bilirubin Total: 0.4 mg/dL (ref 0.0–1.2)
CO2: 24 mmol/L (ref 20–29)
Calcium: 9.6 mg/dL (ref 8.7–10.2)
Chloride: 101 mmol/L (ref 96–106)
Creatinine, Ser: 1.33 mg/dL — ABNORMAL HIGH (ref 0.76–1.27)
GFR calc Af Amer: 69 mL/min/{1.73_m2} (ref >59)
GFR calc non Af Amer: 59 mL/min/{1.73_m2} — ABNORMAL LOW (ref >59)
Globulin, Total: 2.8 g/dL (ref 1.5–4.5)
Glucose: 85 mg/dL (ref 65–99)
Potassium: 5 mmol/L (ref 3.5–5.2)
Sodium: 139 mmol/L (ref 134–144)
Total Protein: 7.1 g/dL (ref 6.0–8.5)

## 2020-11-18 LAB — CBC WITH DIFFERENTIAL/PLATELET
Basophils Absolute: 0.1 10*3/uL (ref 0.0–0.2)
Basos: 1 %
EOS (ABSOLUTE): 0.4 10*3/uL (ref 0.0–0.4)
Eos: 4 %
Hematocrit: 46.5 % (ref 37.5–51.0)
Hemoglobin: 16.1 g/dL (ref 13.0–17.7)
Immature Grans (Abs): 0 10*3/uL (ref 0.0–0.1)
Immature Granulocytes: 0 %
Lymphocytes Absolute: 2.4 10*3/uL (ref 0.7–3.1)
Lymphs: 31 %
MCH: 30.7 pg (ref 26.6–33.0)
MCHC: 34.6 g/dL (ref 31.5–35.7)
MCV: 89 fL (ref 79–97)
Monocytes Absolute: 0.6 10*3/uL (ref 0.1–0.9)
Monocytes: 8 %
Neutrophils Absolute: 4.5 10*3/uL (ref 1.4–7.0)
Neutrophils: 56 %
Platelets: 293 10*3/uL (ref 150–450)
RBC: 5.24 x10E6/uL (ref 4.14–5.80)
RDW: 15.3 % (ref 11.6–15.4)
WBC: 8 10*3/uL (ref 3.4–10.8)

## 2020-11-18 LAB — SEDIMENTATION RATE: Sed Rate: 11 mm/hr (ref 0–30)

## 2020-11-18 LAB — B12 AND FOLATE PANEL
Folate: 10 ng/mL (ref >3.0)
Vitamin B-12: 478 pg/mL (ref 232–1245)

## 2020-11-18 LAB — HGB A1C W/O EAG: Hgb A1c MFr Bld: 5.6 % (ref 4.8–5.6)

## 2020-11-18 LAB — VITAMIN B1: Thiamine: 151.8 nmol/L (ref 66.5–200.0)

## 2020-11-18 LAB — TSH: TSH: 0.714 u[IU]/mL (ref 0.450–4.500)

## 2020-11-18 LAB — RPR: RPR Ser Ql: NONREACTIVE

## 2020-11-18 NOTE — Telephone Encounter (Signed)
I called pt again to discuss. No answer, left a message asking him to call me back. 

## 2020-11-23 ENCOUNTER — Other Ambulatory Visit: Payer: No Typology Code available for payment source

## 2020-11-23 NOTE — Telephone Encounter (Signed)
I called pt. I left a detailed message on his cell phone, per DPR, discussing his lab results and recommendations. I advised him to let us know before tomorrow's MRI appt if he does not want to have the contrasted MRI. I advised pt to have close follow up with his PCP regarding his mildly impaired [name] function. I asked pt to call us back with questions or concerns.  I called pt's wife, Kenneth [name], per DPR. I discussed pt's lab results and recommendations. She will let us know ASAP if they would prefer to not have the MRI with contrast since that appt is tomorrow. Pt's wife verbalized understanding of results and recommendations.

## 2020-11-24 ENCOUNTER — Ambulatory Visit (INDEPENDENT_AMBULATORY_CARE_PROVIDER_SITE_OTHER): Payer: BC Managed Care – PPO

## 2020-11-24 DIAGNOSIS — R6889 Other general symptoms and signs: Secondary | ICD-10-CM | POA: Diagnosis not present

## 2020-11-24 DIAGNOSIS — G4719 Other hypersomnia: Secondary | ICD-10-CM | POA: Diagnosis not present

## 2020-11-24 DIAGNOSIS — R7989 Other specified abnormal findings of blood chemistry: Secondary | ICD-10-CM

## 2020-11-24 DIAGNOSIS — R0683 Snoring: Secondary | ICD-10-CM

## 2020-11-24 DIAGNOSIS — R351 Nocturia: Secondary | ICD-10-CM

## 2020-11-24 DIAGNOSIS — R413 Other amnesia: Secondary | ICD-10-CM | POA: Diagnosis not present

## 2020-11-24 DIAGNOSIS — D352 Benign neoplasm of pituitary gland: Secondary | ICD-10-CM

## 2020-11-24 MED ORDER — GADOBENATE DIMEGLUMINE 529 MG/ML IV SOLN
20.0000 mL | Freq: Once | INTRAVENOUS | Status: AC | PRN
  Administered 2020-11-24: 20 mL via INTRAVENOUS

## 2020-11-27 NOTE — Progress Notes (Signed)
Please call patient regarding his brain MRI.  His brain MRI with and without contrast from 11/24/2020 showed chronic changes.  He had chronic white matter changes which can be secondary to a variety of causes including remote trauma, migraine headaches, hardening of the arteries.  These are not acute findings.  No abnormal contrast uptake.  He had evidence of a old bleed and evidence of a blood vessel related abnormality which was seen on an MRI brain from 2012 and was deemed stable since 2012, which is reassuring.  Overall, stable and nonspecific and chronic findings.  Although the appearance of his hemangioma, which is the blood vessel abnormality is deemed stable from 9 years ago, if he wishes, we can seek consultation with a neurosurgeon.  If he is interested in consulting with neurosurgery, we can place a referral. We will proceed with the EEG and sleep study, as discussed.

## 2020-11-30 ENCOUNTER — Telehealth: Payer: Self-pay

## 2020-11-30 DIAGNOSIS — D352 Benign neoplasm of pituitary gland: Secondary | ICD-10-CM

## 2020-11-30 NOTE — Telephone Encounter (Signed)
I called pt to discuss. No answer, left a message asking him to call me back. 

## 2020-11-30 NOTE — Telephone Encounter (Signed)
-----   Message from Star Age, MD sent at 11/27/2020  9:50 AM EST ----- Please call patient regarding his brain MRI.  His brain MRI with and without contrast from 11/24/2020 showed chronic changes.  He had chronic white matter changes which can be secondary to a variety of causes including remote trauma, migraine headaches, hardening of the arteries.  These are not acute findings.  No abnormal contrast uptake.  He had evidence of a old bleed and evidence of a blood vessel related abnormality which was seen on an MRI brain from 2012 and was deemed stable since 2012, which is reassuring.  Overall, stable and nonspecific and chronic findings.  Although the appearance of his hemangioma, which is the blood vessel abnormality is deemed stable from 9 years ago, if he wishes, we can seek consultation with a neurosurgeon.  If he is interested in consulting with neurosurgery, we can place a referral. We will proceed with the EEG and sleep study, as discussed.

## 2020-12-01 NOTE — Telephone Encounter (Signed)
Patient returned your call.

## 2020-12-01 NOTE — Telephone Encounter (Signed)
I called pt. I discussed his MRI results and recommendations with him. He would like a neurosurgery consult for the hemangioma. Pt will proceed with the EEG and sleep study. Pt verbalized understanding of results. Pt had no questions at this time but was encouraged to call back if questions arise.

## 2021-01-10 ENCOUNTER — Other Ambulatory Visit: Payer: Self-pay | Admitting: Internal Medicine

## 2021-01-10 DIAGNOSIS — I5022 Chronic systolic (congestive) heart failure: Secondary | ICD-10-CM

## 2021-01-10 DIAGNOSIS — I255 Ischemic cardiomyopathy: Secondary | ICD-10-CM

## 2021-01-28 ENCOUNTER — Ambulatory Visit: Payer: No Typology Code available for payment source | Admitting: Neurology

## 2021-02-16 ENCOUNTER — Telehealth: Payer: Self-pay
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
## 2021-02-17 ENCOUNTER — Ambulatory Visit: Payer: BC Managed Care – PPO | Admitting: Neurology

## 2021-02-17 ENCOUNTER — Encounter: Payer: Self-pay | Admitting: Neurology

## 2021-02-17 ENCOUNTER — Telehealth: Payer: Self-pay

## 2021-02-17 NOTE — Telephone Encounter (Signed)
Pt no showed 02/17/21 appt
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
## 2021-05-12 ENCOUNTER — Telehealth: Payer: Self-pay | Admitting: Internal Medicine

## 2021-05-12 NOTE — Telephone Encounter (Signed)
New Message:    Pt needs prior authorization for his Repatha please

## 2021-05-13 NOTE — Telephone Encounter (Signed)
Left message for patient that repatha is approved until 08/2021 per my notes If he changed insurance plans for 2022, would need new insurance to do another PA

## 2021-09-02 ENCOUNTER — Telehealth: Payer: Self-pay | Admitting: Internal Medicine

## 2021-09-02 NOTE — Telephone Encounter (Signed)
Spoke with pharmacy and patient now has an Cabin crew  ID: [id number] BIN: [id number] RxGrp: [id number]

## 2021-09-02 NOTE — Telephone Encounter (Signed)
PA for repatha sureclick submitted via CMM (Key: [id number])

## 2021-09-10 NOTE — Telephone Encounter (Signed)
No response on PA received  New PA generated (Key: [id number])

## 2021-09-17 ENCOUNTER — Encounter: Payer: Self-pay | Admitting: Internal Medicine

## 2021-09-17 NOTE — Telephone Encounter (Signed)
PA submitted  (Key: [id number])

## 2021-09-17 NOTE — Telephone Encounter (Signed)
Opened in error

## 2021-09-20 NOTE — Telephone Encounter (Signed)
Patient approved for Repatha from 09/17/2021 - 09/17/2022

## 2021-10-04 ENCOUNTER — Ambulatory Visit: Payer: BC Managed Care – PPO | Admitting: Medical

## 2021-11-17 ENCOUNTER — Encounter: Payer: Self-pay | Admitting: Internal Medicine

## 2021-11-17 ENCOUNTER — Other Ambulatory Visit: Payer: Self-pay

## 2021-11-17 ENCOUNTER — Ambulatory Visit (INDEPENDENT_AMBULATORY_CARE_PROVIDER_SITE_OTHER): Payer: Medicare Other | Admitting: Internal Medicine

## 2021-11-17 VITALS — BP 132/84 | HR 62 | Ht 69.0 in | Wt 232.0 lb

## 2021-11-17 DIAGNOSIS — I5022 Chronic systolic (congestive) heart failure: Secondary | ICD-10-CM | POA: Diagnosis not present

## 2021-11-17 DIAGNOSIS — I2510 Atherosclerotic heart disease of native coronary artery without angina pectoris: Secondary | ICD-10-CM

## 2021-11-17 DIAGNOSIS — I255 Ischemic cardiomyopathy: Secondary | ICD-10-CM

## 2021-11-17 DIAGNOSIS — I10 Essential (primary) hypertension: Secondary | ICD-10-CM

## 2021-11-17 DIAGNOSIS — Z951 Presence of aortocoronary bypass graft: Secondary | ICD-10-CM

## 2021-11-17 DIAGNOSIS — E7849 Other hyperlipidemia: Secondary | ICD-10-CM

## 2021-11-17 MED ORDER — SILDENAFIL CITRATE 50 MG PO TABS: ORAL_TABLET | ORAL | 1 refills | Status: AC

## 2021-11-17 MED ORDER — ENTRESTO 49-51 MG PO TABS: 1.0000 | ORAL_TABLET | Freq: Two times a day (BID) | ORAL | 11 refills | Status: DC

## 2021-11-17 MED ORDER — ATORVASTATIN CALCIUM 80 MG PO TABS: 80.0000 mg | ORAL_TABLET | Freq: Every day | ORAL | 3 refills | Status: AC

## 2021-11-17 MED ORDER — EZETIMIBE 10 MG PO TABS: 10.0000 mg | ORAL_TABLET | Freq: Every day | ORAL | 3 refills | Status: AC

## 2021-11-17 MED ORDER — ASPIRIN EC 81 MG PO TBEC: 81.0000 mg | DELAYED_RELEASE_TABLET | Freq: Every day | ORAL | 3 refills | Status: DC

## 2021-11-17 NOTE — Patient Instructions (Signed)
Medication Instructions:  START: ASPIRIN 81mg  DAILY  ZETIA REFILLED  ENTRESTO REFILLED  LIPITOR REFILLED  SILDENAFIL REFILLED  *If you need a refill on your cardiac medications before your next appointment, please call your pharmacy*  Lab Work: [place] [place] Monday- Friday From 8-4 no appointment needed.  If you have labs (blood work) drawn today and your tests are completely normal, you will receive your results only by: [place] (if you have MyChart) OR A paper copy in the mail If you have any lab test that is abnormal or we need to change your treatment, we will call you to review the results.  Follow-Up: At [facility], you and your health needs are our priority.  As part of our continuing mission to provide you with exceptional heart care, we have created designated Provider Care Teams.  These Care Teams include your primary Cardiologist (physician) and Advanced Practice Providers (APPs -  Physician Assistants and Nurse Practitioners) who all work together to provide you with the care you need, when you need it.    Your next appointment:   6 month(s)  The format for your next appointment:   In Person  Provider: Dr.  Debara Pickett

## 2021-11-17 NOTE — Progress Notes (Signed)
OFFICE NOTE  Chief Complaint:  Followup  Primary Care Physician: Curly Rim, MD  HPI:  Kenneth [name] is a 55 year old male who had been seen by [place] in consult previously. Dr. Debara Pickett saw him in August 2012. He had been having angina. He reportedly had normal epicardial coronaries at catheterization in [place]. He had an extensive workup there that included cardiac and GI evaluation. He was diagnosed with microvascular angina. He had been maintained on nitroglycerin, which helped. He actually even had EECP in the past which he said helped as well. He recently moved to the area and wanted to get established with a cardiologist. Dr. Debara Pickett recommended resuming his Ranexa. He also resumed his nitroglycerin patch and refilled his amlodipine. No functional studies have been done at our office. He was referred back to Dr. Ronnald Ramp for followup. He reports however several weeks ago he thinks he got out of sync with his medications. He ran out of his nitroglycerin patch and was taking more short acting nitroglycerin. He says reestablished his medicines, but reports that he continues to have some worsening chest pain and shortness of breath. He continues to exercise but is noticing more anginal symptoms earlier in his exercise.  Based on the change in his angina pattern, a stress test was recommended. This was performed on 03/12/2014 which showed a marked LCX territory defect with EF 47%.  This was a high risk study. Cardiac catheterization was recommended and performed the next day by Dr. Claiborne Billings.  This revealed the following:  Low normal to mild LV dysfunction with an ejection fraction of 50% with subtle mild anterolateral hypocontractility.  Significant multivessel coronary obstructive disease with 60-70% LAD stenosis after the first septal perforating artery and 50% mid LAD stenosis, probable chronic occlusion of the left circumflex vessel with collateralization to the distal marginal branch  and 95% stenosis in the first marginal branch with 99% stenosis in its distal segment; and probable previous subtotal stenosis of the proximal RCA with bridging collaterals and evidence for a total distal RCA stenosis with evidence for left to right collaterals.  Based on these findings, CABG was recommended.  He underwent 4 vessel CABG on 03/18/14, as follows:  Median Sternotomy Extracorporeal circulation       3.   Coronary artery bypass grafting x 4  Left internal mammary graft to the LAD  SVG to OM  Sequential SVG to PDA and PL       4. Endoscopic vein harvest from the right leg  Kenneth [name] is feeling quite well. His energy is improved and his color is better. He reports some mild soreness to his sternal wound but otherwise is recovering nicely. He is scheduled to see Dr. Cyndia Bent on April 22. He has been involved in cardiac rehabilitation has had no issues. He denies any further angina. We did a recheck of his lipid profile today which shows an improvement in his LDL and particle numbers however he is still not at goal. He is on high dose Lipitor.  I saw Kenneth [name] back in the office today. Overall he is doing extremely well. He is back to normal activities. He's not required any nitrates. Cholesterol still been elevated he is due for recheck to see if the addition is that he had to atorvastatin has gotten into his goal cholesterol. He is also on gemfibrozil. Blood pressure is very well controlled today. A couple weeks ago he had a dizzy spell but he felt that this was  related to taking Vybrid.  Kenneth [name] returns today for follow-up. It's come to my attention that he fractured his left hand apparently while hitting a door. After further questioning he's been under significant stress and may be dealing with some depression at home. His wife is also concerned about some substance abuse and perhaps abusing his Ambien. We have been prescribing that for him, but there were times where she saw him  taking 10 or 12 Ambien at a time. He apparently is been having some hallucinations and sleep talking as well as sleepwalking. He has been on Wellbutrin but does not feel that it's helping him much. He reports he and his wife have been getting some counseling. He is apparently scheduled to have surgery on his left arm but is been reporting some chest pain. He says it comes on somewhat with exertion and is relieved by rest. His EKG today shows sinus rhythm with inferior T-wave changes concerning for ischemia. These are slightly different than his prior EKG. As mentioned he had bypass surgery almost 2 years ago.  I saw Kenneth [name] back today in the office. He underwent a nuclear stress test which was negative for ischemia however EF is reduced to 48%. Visually it appeared normal and is suspect for a possible gating problem. He underwent successful hand surgery and has noted some improvement in function. He is not having any more chest pain or shortness of breath. He does feel some fatigue during the day. I spoke with him and his wife about the findings on the stress test and feel that a limited echo to rule out any cardiomyopathy is warranted.  11/17/2021  Kenneth [name] returns today for follow-up.  Have not seen him in quite some time.  He saw Kenneth Memos, NP last year.  He had some work-up for fatigue including an echo which showed normal systolic function without regional wall motion abnormalities.  He also underwent a monitor in October which showed some PACs and PVCs but otherwise no arrhythmias.  He is complaining of fatigue and actually has been given a disability.  He reports not being very active at this point certainly less than he had been in the distant past.  I suspect this is a big factor.  He also reports being out of a lot of medications but has been taking the Entresto.  Blood pressure is normal today which corroborates that.  He has not had lipid testing in over a year.  He is not currently on  the Repatha and will need repeat prior authorization.  He does have a familial hyperlipidemia.  PMHx:  Past Medical History:  Diagnosis Date   Arthritis    CAD (coronary artery disease)    a. 02/2014 Abnl MV with defect in LCX territory;  b. 02/2014 Cath: LAD 60-70p, 87m, LCX 100 CTO, OM1 95p, 99d, RCA 100 CTO w/ L->R collaterals; c. 02/2014 CABG x 4 (LIMA->LAD, VG->OM, VG->RPDA->RPL); d. 01/2016 MV: 41%, med size, sev intensity fixed basal inferior lateral defect w/o ischemia.   CAD (coronary artery disease)    Carotid arterial disease ([place])    a. 06/2013 Carotid U/S: moderate narrowing of both subclavian arteries, with normal carotid arteries;  b. 02/2014 Carotid U/S: 1-39% bilat ICA stenosis.   Depression    Fracture    nasal bone   History of echocardiogram    a. 02/2016 Echo: EF 50-55%, no rwma, mildly dil LA, nl RV fxn, mildly dil RA.   History of kidney stones  Hyperlipidemia    Hypertension    Hypertensive heart disease    PONV (postoperative nausea and vomiting)    Syndrome X, cardiac ([place])    a. Previously felt to have microvascular angina following remote cath in [place]-->treated with nitrates, ranexa, EECP.  Later found to have multivessel CAD.   Wears glasses     Past Surgical History:  Procedure Laterality Date   CARDIAC CATHETERIZATION  2005   CLOSED REDUCTION NASAL FRACTURE N/A 03/27/2020   Procedure: CLOSED REDUCTION NASAL BONES;  Surgeon: Irene Limbo, MD;  Location: [facility];  Service: Plastics;  Laterality: N/A;   COLONOSCOPY     CORONARY ARTERY BYPASS GRAFT N/A 03/18/2014   Procedure: CORONARY ARTERY BYPASS GRAFTING (CABG) x 4 using endoscopically harvested right saphenous vein and left internal mammary artery;  Surgeon: Gaye Pollack, MD;  Location: [facility];  Service: Open Heart Surgery;  Laterality: N/A;   CORONARY ARTERY BYPASS GRAFT     HAND SURGERY Left    HIP ARTHROPLASTY     INTRAOPERATIVE TRANSESOPHAGEAL ECHOCARDIOGRAM N/A 03/18/2014   Procedure:  INTRAOPERATIVE TRANSESOPHAGEAL ECHOCARDIOGRAM;  Surgeon: Gaye Pollack, MD;  Location: [facility];  Service: Open Heart Surgery;  Laterality: N/A;   KIDNEY STONE SURGERY  age 33   LEFT HEART CATHETERIZATION WITH CORONARY ANGIOGRAM N/A 03/14/2014   Procedure: LEFT HEART CATHETERIZATION WITH CORONARY ANGIOGRAM;  Surgeon: Troy Sine, MD;  Location: [facility] CATH LAB;  Service: Cardiovascular;  Laterality: N/A;   SHOULDER ARTHROSCOPY  2001   L shoulder   SHOULDER SURGERY     TOTAL HIP ARTHROPLASTY Left 10/26/2016   Procedure: LEFT TOTAL HIP ARTHROPLASTY ANTERIOR APPROACH;  Surgeon: Gaynelle Arabian, MD;  Location: [facility];  Service: Orthopedics;  Laterality: Left;    FAMHx:  Family History  Problem Relation Age of Onset   Heart attack Mother    Stroke Mother    Diabetes Mother    Heart attack Father    Cancer Father        lung    SOCHx:   reports that he quit smoking about 9 years ago. His smoking use included cigars. He has never used smokeless tobacco. He reports that he does not currently use alcohol. He reports that he does not currently use drugs.  ALLERGIES:  Allergies  Allergen Reactions   Tetracyclines & Related Nausea And Vomiting    ROS: A comprehensive review of systems was negative except for: Musculoskeletal: positive for Left hand pain  HOME MEDS: Current Outpatient Medications  Medication Sig Dispense Refill   aspirin 325 MG tablet Take 325 mg by mouth daily.     atorvastatin (LIPITOR) 80 MG tablet Take 1 tablet (80 mg total) by mouth at bedtime. 90 tablet 3   cyclobenzaprine (FLEXERIL) 10 MG tablet Take 10 mg by mouth 3 (three) times daily as needed.     DULoxetine (CYMBALTA) 60 MG capsule Take 60 mg by mouth daily.      ENTRESTO 49-51 MG TAKE 1 TABLET BY MOUTH TWICE A DAY 60 tablet 11   Evolocumab (REPATHA SURECLICK) 725 MG/ML SOAJ Inject 1 Dose into the skin every 14 (fourteen) days. INJECT 140 MG INTO THE SKIN EVERY 14 (FOURTEEN) DAYS. 6 mL 3   ezetimibe (ZETIA) 10 MG  tablet TAKE 1 TABLET BY MOUTH EVERY DAY 90 tablet 3   hydrochlorothiazide (HYDRODIURIL) 25 MG tablet Take 25 mg by mouth daily.      lisinopril (ZESTRIL) 40 MG tablet Take 1 tablet (40 mg total) by mouth  daily. 90 tablet 0   sildenafil (VIAGRA) 50 MG tablet TAKE 1 TABLET BY MOUTH DAILY AS NEEDED FOR ERECTILE DYSFUNCTION 30 tablet 0   testosterone cypionate (DEPOTESTOSTERONE CYPIONATE) 200 MG/ML injection Inject 300 mg into the muscle every Wednesday.      traMADol (ULTRAM) 50 MG tablet Take 50 mg by mouth every 6 (six) hours as needed.     ondansetron (ZOFRAN ODT) 8 MG disintegrating tablet Take 1 tablet (8 mg total) by mouth every 8 (eight) hours as needed. 8mg  ODT q4 hours prn nausea (Patient not taking: Reported on 11/12/2020) 8 tablet 0   No current facility-administered medications for this visit.    LABS/IMAGING: No results found for this or any previous visit (from the past 48 hour(s)). No results found.  VITALS: BP 132/84   Pulse 62   Ht 5\' 9"  (1.753 m)   Wt 232 lb (105.2 kg)   SpO2 97%   BMI 34.26 kg/m   EXAM: General appearance: alert and no distress Neck: no carotid bruit, no JVD, and thyroid not enlarged, symmetric, no tenderness/mass/nodules Lungs: clear to auscultation bilaterally Heart: regular rate and rhythm, S1, S2 normal, no murmur, click, rub or gallop Abdomen: soft, non-tender; bowel sounds normal; no masses,  no organomegaly Extremities: extremities normal, atraumatic, no cyanosis or edema Pulses: 2+ and symmetric Skin: Skin color, texture, turgor normal. No rashes or lesions Neurologic: Grossly normal Psych: Pleasant  EKG: Sinus bradycardia 56-personally reviewed  ASSESSMENT: Fatigue -LVEF 55 to 60% by echo (08/2020) CAD s/p 4 vessel CABG (LIMA to LAD, SVG to OM, and sequential SVG to PDA/PLB) - 02/2014 Hypertension- at goal Familial hyperlipidemia  Obesity Depression Possible substance abuse-including Ambien Indeterminate risk for surgery Erectile  dysfunction  PLAN: 1.   Kenneth [name] seems to be doing well although has had recent weight gain.  He is not as active as he used to be.  He has had some issues with ADHD.  His PCP would like to have him on a stimulant which is fine.  He is also continuing to have erectile dysfunction issues and will provide a refill of his Viagra.  He is also run out of a number of his medications.  Cholesterol is very high last year with LDL over 230 consistent with a familial hyperlipidemia.  This likely explains his early onset heart disease.  He was previously approved for and taking Repatha in addition to ezetimibe and high intensity atorvastatin.  We will plan to restart these medications.  He will need a repeat lipid now for [place] to get prior authorization.  We will then repeat his labs at follow-up in about 6 months or sooner if necessary.  Pixie Casino, MD, [facility], [place] Director of the Advanced Lipid Disorders &  Cardiovascular Risk Reduction Clinic Diplomate of the American Board of Clinical Lipidology Attending Cardiologist  Direct Dial: [phone]  Fax: [phone]  Website:  [email or web address]    Nadean Corwin Cassiel Fernandez 11/17/2021, 2:28 PM

## 2021-12-07 ENCOUNTER — Other Ambulatory Visit: Payer: Self-pay | Admitting: Internal Medicine

## 2021-12-07 MED ORDER — REPATHA SURECLICK 140 MG/ML ~~LOC~~ SOAJ: 1.0000 | SUBCUTANEOUS | 0 refills | Status: DC

## 2021-12-07 NOTE — Addendum Note (Signed)
Addended by: Fidel Levy on: 12/07/2021 10:13 AM   Modules accepted: Orders

## 2021-12-24 ENCOUNTER — Telehealth: Payer: Self-pay

## 2021-12-24 NOTE — Telephone Encounter (Signed)
° °  Pre-operative Risk Assessment    Patient Name: Kenneth [name]  DOB: August 22, 1966 MRN: [id number]      Request for Surgical Clearance    Procedure:   RIGHT TOTAL HIP ARTHROPLASTY ANTERIOR APPROACH  Date of Surgery:  Clearance 04/27/22                                 Surgeon:  DR Hector Shade Surgeon's Group or Practice Name:  [facility] Phone number:  [phone] Fax number:  [phone]   Type of Clearance Requested:   - Medical  - Pharmacy:  Hold Aspirin     Type of Anesthesia:   CHOICE {

## 2021-12-24 NOTE — Telephone Encounter (Signed)
Left message for the pt that he is going to need an appt for pre op clearance. Surgery is set for 04/27/22, per op provider to make appt for early April 2023. Appt can be with Dr. Debara Pickett or any APP. If at all possible use the pre op time slots when pt calls back.

## 2021-12-24 NOTE — Telephone Encounter (Signed)
Primary Cardiologist:Kenneth C Hilty, MD  Chart reviewed as part of pre-operative protocol coverage. Because of Kenneth [name]'s past medical history and request from primary cardiologist to return in 6 months (May 2023), we will schedule an office visit for April 2023 to better assess preoperative cardiovascular risk.  Pre-op covering staff: - Please schedule appointment for April 2023 and call patient to inform them.  - Please contact requesting surgeon's office via preferred method (i.e, phone, fax) to inform them of need for appointment prior to surgery.  If applicable, this message will also be routed to pharmacy pool and/or primary cardiologist for input on holding anticoagulant/antiplatelet agent as requested below so that this information is available at time of patient's appointment.   Emmaline Life, NP-C    12/24/2021, 11:51 AM [facility] [address] Office [phone] Fax [phone]

## 2021-12-29 NOTE — Telephone Encounter (Signed)
Left message for the pt to call the office for an appt for pre op clearance. See previous notes.

## 2022-01-03 NOTE — Telephone Encounter (Signed)
Pt has appt 04/06/22 for pre op clearance. Will send FYI to requesting office the pt has appt 04/06/22.

## 2022-01-10 ENCOUNTER — Telehealth: Payer: Self-pay | Admitting: Internal Medicine

## 2022-01-10 NOTE — Telephone Encounter (Signed)
Received notice from [employer] that patient needs PA for Repatha. This is a new Buyer, retail. Called [facility] but they do not have new drug coverage info on file. Left message for patient to call back to obtain insurance info.   Will need:  ID: BIN: PCN: RxGrp:

## 2022-01-26 ENCOUNTER — Other Ambulatory Visit: Payer: Self-pay | Admitting: Internal Medicine

## 2022-01-26 DIAGNOSIS — I5022 Chronic systolic (congestive) heart failure: Secondary | ICD-10-CM

## 2022-01-26 DIAGNOSIS — I255 Ischemic cardiomyopathy: Secondary | ICD-10-CM

## 2022-02-02 NOTE — Telephone Encounter (Signed)
Patient never returned call, never completed labs per request of MD at last visit  PA submitted via CMM for repatha  (Key: [id number])  ID: [id number] BIN: [id number] PCN: [id number]

## 2022-02-02 NOTE — Telephone Encounter (Signed)
PA approved   Effective from 02/02/2022 through 02/02/2023  Left message for patient that med approved, contact pharmacy to get filled, complete fasting labs after 3 months on treatment as no recent lipid panel on file

## 2022-02-07 NOTE — Progress Notes (Deleted)
Office Visit    Patient Name: Kenneth [name] Date of Encounter: 02/08/2022  Primary Care Provider:  Curly Rim, MD Primary Cardiologist:  Pixie Casino, MD  Chief Complaint   56 year old male with a history of CAD s/p CABG x4 (LIMA-LAD, SVG-OM, SVG to RPDA, and RPL), chronic combined systolic and diastolid heart failure, hypertension, hyperlipidemia, carotid artery disease, CVA, memory loss, ADHD, and arthritis who presents for follow-up related to CAD and heart failure as well as preoperative cardiac evaluation.  Past Medical History    Past Medical History:  Diagnosis Date   Arthritis    CAD (coronary artery disease)    a. 02/2014 Abnl MV with defect in LCX territory;  b. 02/2014 Cath: LAD 60-70p, 66m, LCX 100 CTO, OM1 95p, 99d, RCA 100 CTO w/ L->R collaterals; c. 02/2014 CABG x 4 (LIMA->LAD, VG->OM, VG->RPDA->RPL); d. 01/2016 MV: 41%, med size, sev intensity fixed basal inferior lateral defect w/o ischemia.   CAD (coronary artery disease)    Carotid arterial disease ([place])    a. 06/2013 Carotid U/S: moderate narrowing of both subclavian arteries, with normal carotid arteries;  b. 02/2014 Carotid U/S: 1-39% bilat ICA stenosis.   Depression    Fracture    nasal bone   History of echocardiogram    a. 02/2016 Echo: EF 50-55%, no rwma, mildly dil LA, nl RV fxn, mildly dil RA.   History of kidney stones    Hyperlipidemia    Hypertension    Hypertensive heart disease    PONV (postoperative nausea and vomiting)    Syndrome X, cardiac ([place])    a. Previously felt to have microvascular angina following remote cath in [place]-->treated with nitrates, ranexa, EECP.  Later found to have multivessel CAD.   Wears glasses    Past Surgical History:  Procedure Laterality Date   CARDIAC CATHETERIZATION  2005   CLOSED REDUCTION NASAL FRACTURE N/A 03/27/2020   Procedure: CLOSED REDUCTION NASAL BONES;  Surgeon: Irene Limbo, MD;  Location: [facility];  Service: Plastics;   Laterality: N/A;   COLONOSCOPY     CORONARY ARTERY BYPASS GRAFT N/A 03/18/2014   Procedure: CORONARY ARTERY BYPASS GRAFTING (CABG) x 4 using endoscopically harvested right saphenous vein and left internal mammary artery;  Surgeon: Gaye Pollack, MD;  Location: [facility];  Service: Open Heart Surgery;  Laterality: N/A;   CORONARY ARTERY BYPASS GRAFT     HAND SURGERY Left    HIP ARTHROPLASTY     INTRAOPERATIVE TRANSESOPHAGEAL ECHOCARDIOGRAM N/A 03/18/2014   Procedure: INTRAOPERATIVE TRANSESOPHAGEAL ECHOCARDIOGRAM;  Surgeon: Gaye Pollack, MD;  Location: [facility];  Service: Open Heart Surgery;  Laterality: N/A;   KIDNEY STONE SURGERY  age 66   LEFT HEART CATHETERIZATION WITH CORONARY ANGIOGRAM N/A 03/14/2014   Procedure: LEFT HEART CATHETERIZATION WITH CORONARY ANGIOGRAM;  Surgeon: Troy Sine, MD;  Location: [facility] CATH LAB;  Service: Cardiovascular;  Laterality: N/A;   SHOULDER ARTHROSCOPY  2001   L shoulder   SHOULDER SURGERY     TOTAL HIP ARTHROPLASTY Left 10/26/2016   Procedure: LEFT TOTAL HIP ARTHROPLASTY ANTERIOR APPROACH;  Surgeon: Gaynelle Arabian, MD;  Location: [facility];  Service: Orthopedics;  Laterality: Left;    Allergies  Allergies  Allergen Reactions   Tetracyclines & Related Nausea And Vomiting    History of Present Illness    56 year old male with the above past medical history including CAD s/p CABG x4 (LIMA-LAD, SVG-OM, SVG to RPDA, and RPL), chronic combined systolic and diastolic heart  failure, hypertension, hyperlipidemia, carotid artery disease, CVA, memory loss, ADHD, and arthritis.  Cardiac catheterization in 2015 showed multivessel disease s/p has a CABG x4 (LIMA-LAD, SVG-OM, SVG to RPDA, and RPL).  EF at the time was 41%.  Low risk Myoview in 2017 and 2019.  Echocardiogram in September 2021 in the setting of fatigue showed EF 55 to 60%, moderate LVH, G1 DD, no significant change from prior study. Outpatient monitor in October 2021 in the setting of palpitations showed PACs and  PVCs, no significant arrhythmias. He was last seen in the office 11/17/2021 and was doing well overall from a cardiac standpoint though he did report weight gain at the time due to increased sedentary lifestyle, additionally, he had run out of several of his medications.    He presents today for follow-up. Since his last visit   CAD: Chronic combined systolic and diastolic heart failure:  Hypertension: Hyperlipidemia: Due for repeat lipids.  Carotid artery disease: Pre-CABG dopplers in 2015 showed mild bilateral 1-39% ICA stenosis.  Preoperative cardiac evaluation: R hip total arthroplasty Dr. Gaynelle Arabian May 2023.  Disposition:  Home Medications    Current Outpatient Medications  Medication Sig Dispense Refill   aspirin EC 81 MG tablet Take 1 tablet (81 mg total) by mouth daily. Swallow whole. 90 tablet 3   atorvastatin (LIPITOR) 80 MG tablet Take 1 tablet (80 mg total) by mouth at bedtime. 90 tablet 3   cyclobenzaprine (FLEXERIL) 10 MG tablet Take 10 mg by mouth 3 (three) times daily as needed.     DULoxetine (CYMBALTA) 60 MG capsule Take 60 mg by mouth daily.      ENTRESTO 49-51 MG TAKE ONE TABLET BY MOUTH TWICE A DAY 60 tablet 11   Evolocumab (REPATHA SURECLICK) 295 MG/ML SOAJ Inject 1 Dose into the skin every 14 (fourteen) days. INJECT 140 MG INTO THE SKIN EVERY 14 (FOURTEEN) DAYS. 6 mL 0   ezetimibe (ZETIA) 10 MG tablet Take 1 tablet (10 mg total) by mouth daily. 90 tablet 3   ondansetron (ZOFRAN ODT) 8 MG disintegrating tablet Take 1 tablet (8 mg total) by mouth every 8 (eight) hours as needed. 8mg  ODT q4 hours prn nausea (Patient not taking: Reported on 11/12/2020) 8 tablet 0   sildenafil (VIAGRA) 50 MG tablet TAKE 1 TABLET BY MOUTH DAILY AS NEEDED FOR ERECTILE DYSFUNCTION 60 tablet 1   testosterone cypionate (DEPOTESTOSTERONE CYPIONATE) 200 MG/ML injection Inject 300 mg into the muscle every Wednesday.      traMADol (ULTRAM) 50 MG tablet Take 50 mg by mouth every 6 (six) hours as  needed.     No current facility-administered medications for this visit.     Review of Systems    ***.  All other systems reviewed and are otherwise negative except as noted above.    Physical Exam    VS:  There were no vitals taken for this visit. , BMI There is no height or weight on file to calculate BMI.     GEN: Well nourished, well developed, in no acute distress. HEENT: normal. Neck: Supple, no JVD, carotid bruits, or masses. Cardiac: RRR, no murmurs, rubs, or gallops. No clubbing, cyanosis, edema.  Radials/DP/PT 2+ and equal bilaterally.  Respiratory:  Respirations regular and unlabored, clear to auscultation bilaterally. GI: Soft, nontender, nondistended, BS + x 4. MS: no deformity or atrophy. Skin: warm and dry, no rash. Neuro:  Strength and sensation are intact. Psych: Normal affect.  Accessory Clinical Findings    ECG personally reviewed  by me today - *** - no acute changes.  Lab Results  Component Value Date   WBC 8.0 11/12/2020   HGB 16.1 11/12/2020   HCT 46.5 11/12/2020   MCV 89 11/12/2020   PLT 293 11/12/2020   Lab Results  Component Value Date   CREATININE 1.33 (H) 11/12/2020   BUN 23 11/12/2020   NA 139 11/12/2020   K 5.0 11/12/2020   CL 101 11/12/2020   CO2 24 11/12/2020   Lab Results  Component Value Date   ALT 21 11/12/2020   AST 17 11/12/2020   ALKPHOS 66 11/12/2020   BILITOT 0.4 11/12/2020   Lab Results  Component Value Date   CHOL 346 (H) 08/24/2020   HDL 43 08/24/2020   LDLCALC 257 (H) 08/24/2020   TRIG 221 (H) 08/24/2020   CHOLHDL 8.0 (H) 08/24/2020    Lab Results  Component Value Date   HGBA1C 5.6 11/12/2020    Assessment & Plan    1.  ***   Lenna Sciara, NP 02/08/2022, 7:47 AM

## 2022-02-08 ENCOUNTER — Encounter: Payer: Self-pay | Admitting: Physician Assistant

## 2022-02-08 ENCOUNTER — Ambulatory Visit (INDEPENDENT_AMBULATORY_CARE_PROVIDER_SITE_OTHER): Payer: Medicare Other | Admitting: Nurse Practitioner

## 2022-02-08 ENCOUNTER — Other Ambulatory Visit: Payer: Self-pay

## 2022-02-08 VITALS — BP 172/98 | HR 65 | Ht 69.0 in | Wt 237.0 lb

## 2022-02-08 DIAGNOSIS — I5042 Chronic combined systolic (congestive) and diastolic (congestive) heart failure: Secondary | ICD-10-CM

## 2022-02-08 DIAGNOSIS — I10 Essential (primary) hypertension: Secondary | ICD-10-CM

## 2022-02-08 DIAGNOSIS — E785 Hyperlipidemia, unspecified: Secondary | ICD-10-CM

## 2022-02-08 DIAGNOSIS — I6523 Occlusion and stenosis of bilateral carotid arteries: Secondary | ICD-10-CM

## 2022-02-08 DIAGNOSIS — Z01810 Encounter for preprocedural cardiovascular examination: Secondary | ICD-10-CM | POA: Diagnosis not present

## 2022-02-08 DIAGNOSIS — I2510 Atherosclerotic heart disease of native coronary artery without angina pectoris: Secondary | ICD-10-CM | POA: Diagnosis not present

## 2022-02-08 MED ORDER — ENTRESTO 97-103 MG PO TABS: 1.0000 | ORAL_TABLET | Freq: Two times a day (BID) | ORAL | 3 refills | Status: DC

## 2022-02-08 NOTE — Progress Notes (Signed)
Office Visit    Patient Name: Kenneth [name] Date of Encounter: 02/08/2022  Primary Care Provider:  Curly Rim, MD Primary Cardiologist:  Pixie Casino, MD  Chief Complaint   56 year old male with a history of CAD s/p CABG x4 (LIMA-LAD, SVG-OM, SVG to RPDA, and RPL), chronic combined systolic and diastolid heart failure, hypertension, hyperlipidemia, carotid artery disease, CVA, memory loss, ADHD, and arthritis who presents for follow-up related to CAD, heart failure and preoperative cardiac evaluation.  Past Medical History    Past Medical History:  Diagnosis Date   Arthritis    CAD (coronary artery disease)    a. 02/2014 Abnl MV with defect in LCX territory;  b. 02/2014 Cath: LAD 60-70p, 40m, LCX 100 CTO, OM1 95p, 99d, RCA 100 CTO w/ L->R collaterals; c. 02/2014 CABG x 4 (LIMA->LAD, VG->OM, VG->RPDA->RPL); d. 01/2016 MV: 41%, med size, sev intensity fixed basal inferior lateral defect w/o ischemia.   CAD (coronary artery disease)    Carotid arterial disease ([place])    a. 06/2013 Carotid U/S: moderate narrowing of both subclavian arteries, with normal carotid arteries;  b. 02/2014 Carotid U/S: 1-39% bilat ICA stenosis.   Depression    Fracture    nasal bone   History of echocardiogram    a. 02/2016 Echo: EF 50-55%, no rwma, mildly dil LA, nl RV fxn, mildly dil RA.   History of kidney stones    Hyperlipidemia    Hypertension    Hypertensive heart disease    PONV (postoperative nausea and vomiting)    Syndrome X, cardiac ([place])    a. Previously felt to have microvascular angina following remote cath in [place]-->treated with nitrates, ranexa, EECP.  Later found to have multivessel CAD.   Wears glasses    Past Surgical History:  Procedure Laterality Date   CARDIAC CATHETERIZATION  2005   CLOSED REDUCTION NASAL FRACTURE N/A 03/27/2020   Procedure: CLOSED REDUCTION NASAL BONES;  Surgeon: Irene Limbo, MD;  Location: [facility];  Service: Plastics;  Laterality: N/A;    COLONOSCOPY     CORONARY ARTERY BYPASS GRAFT N/A 03/18/2014   Procedure: CORONARY ARTERY BYPASS GRAFTING (CABG) x 4 using endoscopically harvested right saphenous vein and left internal mammary artery;  Surgeon: Gaye Pollack, MD;  Location: [facility];  Service: Open Heart Surgery;  Laterality: N/A;   CORONARY ARTERY BYPASS GRAFT     HAND SURGERY Left    HIP ARTHROPLASTY     INTRAOPERATIVE TRANSESOPHAGEAL ECHOCARDIOGRAM N/A 03/18/2014   Procedure: INTRAOPERATIVE TRANSESOPHAGEAL ECHOCARDIOGRAM;  Surgeon: Gaye Pollack, MD;  Location: [facility];  Service: Open Heart Surgery;  Laterality: N/A;   KIDNEY STONE SURGERY  age 41   LEFT HEART CATHETERIZATION WITH CORONARY ANGIOGRAM N/A 03/14/2014   Procedure: LEFT HEART CATHETERIZATION WITH CORONARY ANGIOGRAM;  Surgeon: Troy Sine, MD;  Location: [facility] CATH LAB;  Service: Cardiovascular;  Laterality: N/A;   SHOULDER ARTHROSCOPY  2001   L shoulder   SHOULDER SURGERY     TOTAL HIP ARTHROPLASTY Left 10/26/2016   Procedure: LEFT TOTAL HIP ARTHROPLASTY ANTERIOR APPROACH;  Surgeon: Gaynelle Arabian, MD;  Location: [facility];  Service: Orthopedics;  Laterality: Left;    Allergies  Allergies  Allergen Reactions   Tetracyclines & Related Nausea And Vomiting    History of Present Illness    56 year old male with the above past medical history including CAD s/p CABG x4 (LIMA-LAD, SVG-OM, SVG to RPDA, and RPL), chronic combined systolic and diastolic heart failure, hypertension, hyperlipidemia,  carotid artery disease, CVA, memory loss, ADHD, and arthritis.  Cardiac catheterization in 2015 showed multivessel disease s/p has a CABG x4 (LIMA-LAD, SVG-OM, SVG to RPDA, and RPL).  EF at the time was 41%.  Low risk Myoview in 2017 and 2019.  Echocardiogram in September 2021 in the setting of fatigue showed EF 55 to 60%, moderate LVH, G1 DD, no significant change from prior study. Outpatient monitor in October 2021 in the setting of palpitations showed PACs and PVCs, no  significant arrhythmias. He was last seen in the office 11/17/2021 and was doing well overall from a cardiac standpoint though he did report weight gain at the time due to increased sedentary lifestyle, additionally, he had run out of several of his medications.    He presents today for follow-up and for preoperative cardiac evaluation for upcoming R hip total arthroplasty at the request of Dr. Pilar Plate [name] scheduled for May 2023. Since his last visit his been stable from a cardiac standpoint. His hip pain does limit his activity somewhat, though he denies any symptoms concerning for angina. He does report elevated blood pressure readings at home.  Otherwise, he reports feeling well denies any specific concerns or complaints today.  Home Medications    Current Outpatient Medications  Medication Sig Dispense Refill   aspirin EC 81 MG tablet Take 1 tablet (81 mg total) by mouth daily. Swallow whole. 90 tablet 3   atorvastatin (LIPITOR) 80 MG tablet Take 1 tablet (80 mg total) by mouth at bedtime. 90 tablet 3   cyclobenzaprine (FLEXERIL) 10 MG tablet Take 10 mg by mouth 3 (three) times daily as needed.     Evolocumab (REPATHA SURECLICK) 937 MG/ML SOAJ Inject 1 Dose into the skin every 14 (fourteen) days. INJECT 140 MG INTO THE SKIN EVERY 14 (FOURTEEN) DAYS. 6 mL 0   ezetimibe (ZETIA) 10 MG tablet Take 1 tablet (10 mg total) by mouth daily. 90 tablet 3   sacubitril-valsartan (ENTRESTO) 97-103 MG Take 1 tablet by mouth 2 (two) times daily. 60 tablet 3   sildenafil (VIAGRA) 50 MG tablet TAKE 1 TABLET BY MOUTH DAILY AS NEEDED FOR ERECTILE DYSFUNCTION 60 tablet 1   testosterone cypionate (DEPOTESTOSTERONE CYPIONATE) 200 MG/ML injection Inject 300 mg into the muscle every Wednesday.      traMADol (ULTRAM) 50 MG tablet Take 50 mg by mouth every 6 (six) hours as needed.     DULoxetine (CYMBALTA) 60 MG capsule Take 60 mg by mouth daily.      No current facility-administered medications for this visit.      Review of Systems    He denies chest pain, palpitations, dyspnea, pnd, orthopnea, n, v, dizziness, syncope, edema, weight gain, or early satiety. All other systems reviewed and are otherwise negative except as noted above.   Physical Exam    VS:  BP (!) 172/98    Pulse 65    Ht 5\' 9"  (1.753 m)    Wt 237 lb (107.5 kg)    SpO2 98%    BMI 35.00 kg/m   GEN: Well nourished, well developed, in no acute distress. HEENT: normal. Neck: Supple, no JVD, carotid bruits, or masses. Cardiac: RRR, no murmurs, rubs, or gallops. No clubbing, cyanosis, edema.  Radials/DP/PT 2+ and equal bilaterally.  Respiratory:  Respirations regular and unlabored, clear to auscultation bilaterally. GI: Soft, nontender, nondistended, BS + x 4. MS: no deformity or atrophy. Skin: warm and dry, no rash. Neuro:  Strength and sensation are intact. Psych: Normal affect.  Accessory Clinical Findings    ECG personally reviewed by me today -NSR, 65 bpm, LAD- no acute changes.  Lab Results  Component Value Date   WBC 8.0 11/12/2020   HGB 16.1 11/12/2020   HCT 46.5 11/12/2020   MCV 89 11/12/2020   PLT 293 11/12/2020   Lab Results  Component Value Date   CREATININE 1.33 (H) 11/12/2020   BUN 23 11/12/2020   NA 139 11/12/2020   K 5.0 11/12/2020   CL 101 11/12/2020   CO2 24 11/12/2020   Lab Results  Component Value Date   ALT 21 11/12/2020   AST 17 11/12/2020   ALKPHOS 66 11/12/2020   BILITOT 0.4 11/12/2020   Lab Results  Component Value Date   CHOL 346 (H) 08/24/2020   HDL 43 08/24/2020   LDLCALC 257 (H) 08/24/2020   TRIG 221 (H) 08/24/2020   CHOLHDL 8.0 (H) 08/24/2020    Lab Results  Component Value Date   HGBA1C 5.6 11/12/2020    Assessment & Plan    1. CAD: S/p has a CABG x4 (LIMA-LAD, SVG-OM, SVG to RPDA, and RPL) in 2015, EF 41% at the time. Low risk Myoview in 2017 and 2019.  Recent echo in 2021 showed EF 55 to 60%, moderate LVH, G1 DD, no significant change from prior study. Stable with no  anginal symptoms. No indication for ischemic evaluation.  Continue ASA, Entresto, Lipitor, Zetia, and Repatha.  2. Chronic combined systolic and diastolic heart failure: Most recent echo as above. Euvolemic and well compensated on exam.  He did not tolerate beta-blocker in the past due to fatigue and weakness.  No indication for loop diuretic at this time.  Continue Entresto as below.  3. Hypertension: BP above goal in office today.  He reports elevated home BP readings as well.  Will increase Entresto to 97-103 twice daily.  Last creatinine stable at 1.33. Recheck CMET in 1-2 weeks.  We will keep home BP log and report BP consistently> 130/80.   4. Hyperlipidemia: No recent LDL on file. He is due for repeat lipids, unfortunately, he is not fasting today. Will have him return in 1 to 2 weeks for fasting lipid panel along with CMET as above.  Continue aspirin, Lipitor, Zetia, and Repatha.  5. Carotid artery disease: Pre-CABG dopplers in 2015 showed mild bilateral 1-39% ICA stenosis.  No bruit on exam.  Asymptomatic.  No indication for further testing at this time.  6. Preoperative cardiac evaluation: He is awaiting right hip total arthroplasty by Dr. Gaynelle Arabian scheduled for May 2023 though he is hoping to have this done sooner schedule permits.  According to the Revised Cardiac Risk Index (RCRI), his Perioperative Risk of Major Cardiac Event is (%): 11. His Functional Capacity in METs is: 7.99 according to the Duke Activity Status Index (DASI). Therefore, based on ACC/AHA guidelines, patient would be at acceptable risk for the planned procedure without further cardiovascular testing. I will route this recommendation to the requesting party via Epic fax function.  Patient takes ASA 81 mg daily.  Ideally, he would continue ASA throughout perioperative period, however, if required, he may hold ASA 81 mg for 5 to 7 days prior to surgery. Please resume ASA as soon as possible in the postoperative period at  the discretion of the surgeon.  7. Disposition: Follow-up in 6 months.  Lenna Sciara, NP 02/08/2022, 9:23 AM

## 2022-02-08 NOTE — Patient Instructions (Signed)
Medication Instructions:  Increase Entresto 97/103 one tablet twice daily Hold Asprin 5-7 days prior to surgery  *If you need a refill on your cardiac medications before your next appointment, please call your pharmacy*   Lab Work: Your physician recommends that you return for lab work in 1-2 weeks CMET added today. Pt has previous orders in Epic as well.   If you have labs (blood work) drawn today and your tests are completely normal, you will receive your results only by: [place] (if you have MyChart) OR A paper copy in the mail If you have any lab test that is abnormal or we need to change your treatment, we will call you to review the results.   Testing/Procedures: NONE ordered at this time of appointment     Follow-Up: At [facility], you and your health needs are our priority.  As part of our continuing mission to provide you with exceptional heart care, we have created designated Provider Care Teams.  These Care Teams include your primary Cardiologist (physician) and Advanced Practice Providers (APPs -  Physician Assistants and Nurse Practitioners) who all work together to provide you with the care you need, when you need it.  We recommend signing up for the patient portal called "MyChart".  Sign up information is provided on this After Visit Summary.  MyChart is used to connect with patients for Virtual Visits (Telemedicine).  Patients are able to view lab/test results, encounter notes, upcoming appointments, etc.  Non-urgent messages can be sent to your provider as well.   To learn more about what you can do with MyChart, go to [email or web address].    Your next appointment:   6 month(s)  The format for your next appointment:   In Person  Provider:   Pixie Casino, MD  or Diona Browner, NP        Other Instructions Keep Blood Pressure log. Report if blood pressure is consistently greater than 130/80

## 2022-04-01 NOTE — H&P (Signed)
TOTAL HIP ADMISSION H&P ? ?Patient is admitted for right total hip arthroplasty. ? ?Subjective: ? ?Chief Complaint: Right hip pain ? ?HPI: Kenneth [name], 56 y.o. male, has a history of pain and functional disability in the right hip due to arthritis and patient has failed non-surgical conservative treatments for greater than 12 weeks to include NSAID's and/or analgesics, flexibility and strengthening excercises, and activity modification. Onset of symptoms was gradual, starting  several  years ago with gradually worsening course since that time. The patient noted no past surgery on the right hip. Patient currently rates pain in the right hip at 7 out of 10 with activity. Patient has worsening of pain with activity and weight bearing, pain that interfers with activities of daily living, pain with passive range of motion, and crepitus. Patient has evidence of periarticular osteophytes and joint space narrowing by imaging studies. This condition presents safety issues increasing the risk of falls. There is no current active infection. ? ?Patient Active Problem List  ? Diagnosis Date Noted  ? Chronic systolic heart failure ([place]) 01/09/2020  ? Ischemic cardiomyopathy 01/09/2020  ? OA (osteoarthritis) of hip 10/26/2016  ? Hyperlipidemia   ? CAD (coronary artery disease)   ? Hypertensive heart disease   ? Depression 01/26/2016  ? Chest pain 01/26/2016  ? Preoperative cardiovascular examination 01/26/2016  ? S/P CABG x 4 03/18/2014  ? Coronary artery disease-3 V CAD at cath 03/15/2014  ? HTN (hypertension) 06/26/2013  ? Dyslipidemia 06/26/2013  ? Obesity (BMI 30-39.9) 06/26/2013  ? ? ?Past Medical History:  ?Diagnosis Date  ? Arthritis   ? CAD (coronary artery disease)   ? a. 02/2014 Abnl MV with defect in LCX territory;  b. 02/2014 Cath: LAD 60-70p, 49m LCX 100 CTO, OM1 95p, 99d, RCA 100 CTO w/ L->R collaterals; c. 02/2014 CABG x 4 (LIMA->LAD, VG->OM, VG->RPDA->RPL); d. 01/2016 MV: 41%, med size, sev intensity  fixed basal inferior lateral defect w/o ischemia.  ? CAD (coronary artery disease)   ? Car[place]id arterial disease ([place]   ? a. 06/2013 Carotid U/S: moderate narrowing of both subclavian arteries, with normal carotid arteries;  b. 02/2014 Carotid U/S: 1-39% bilat ICA stenosis.  ? Depression   ? Fracture   ? nasal bone  ? History of echocardiogram   ? a. 02/2016 Echo: EF 50-55%, no rwma, mildly dil LA, nl RV fxn, mildly dil RA.  ? History of kidney stones   ? Hyperlipidemia   ? Hypertension   ? Hypertensive heart disease   ? PONV (postoperative nausea and vomiting)   ? Syndrome X, cardiac ([place]   ? a. Previously felt to have microvascular angina following remote cath in [place]-->treated with nitrates, ranexa, EECP.  Later found to have multivessel CAD.  ? Wears glasses   ? ? ?Past Surgical History:  ?Procedure Laterality Date  ? CARDIAC CATHETERIZATION  2005  ? CLOSED REDUCTION NASAL FRACTURE N/A 03/27/2020  ? Procedure: CLOSED REDUCTION NASAL BONES;  Surgeon: TIrene Limbo MD;  Location[facility]  Service: Plastics;  Laterality: N/A;  ? COLONOSCOPY    ? CORONARY ARTERY BYPASS GRAFT N/A 03/18/2014  ? Procedure: CORONARY ARTERY BYPASS GRAFTING (CABG) x 4 using endoscopically harvested right saphenous vein and left internal mammary artery;  Surgeon: BGaye Pollack MD;  [facility];  Service: Open Heart Surgery;  Laterality: N/A;  ? CORONARY ARTERY BYPASS GRAFT    ? HAND SURGERY Left   ? HIP ARTHROPLASTY    ? INTRAOPERATIVE TRANSESOPHAGEAL ECHOCARDIOGRAM N/A  03/18/2014  ? Procedure: INTRAOPERATIVE TRANSESOPHAGEAL ECHOCARDIOGRAM;  Surgeon: Gaye Pollack, MD;  Location[facility];  Service: Open Heart Surgery;  Laterality: N/A;  ? KIDNEY STONE SURGERY  age 74  ? LEFT HEART CATHETERIZATION WITH CORONARY ANGIOGRAM N/A 03/14/2014  ? Procedure: LEFT HEART CATHETERIZATION WITH CORONARY ANGIOGRAM;  Surgeon: Troy Sine, MD;  Location[facility] C[facility] LAB;  Service: Cardiovascular;  Laterality: N/A;  ? SHOULDER ARTHROSCOPY  2001  ? L  shoulder  ? SHOULDER SURGERY    ? TOTAL HIP ARTHROPLASTY Left 10/26/2016  ? Procedure: LEFT TOTAL HIP ARTHROPLASTY ANTERIOR APPROACH;  Surgeon: Gaynelle Arabian, MD;  Location[facility];  Service: Orthopedics;  Laterality: Left;  ? ? ?Prior to Admission medications   ?Medication Sig Start Date End Date Taking? Authorizing Provider  ?aspirin EC 81 MG tablet Take 1 tablet (81 mg total) by mouth daily. Swallow whole. 11/17/21   Pixie Casino, MD  ?atorvastatin (LIPITOR) 80 MG tablet Take 1 tablet (80 mg total) by mouth at bedtime. 11/17/21   Hilty, Nadean Corwin, MD  ?cyclobenzaprine (FLEXERIL) 10 MG tablet Take 10 mg by mouth 3 (three) times daily as needed. 09/03/20   [provider]  ?DULoxetine (CYMBALTA) 60 MG capsule Take 60 mg by mouth daily.  09/11/19 11/17/21  [provider]  ?Evolocumab (REPATHA SURECLICK) 076 MG/ML SOAJ Inject 1 Dose into the skin every 14 (fourteen) days. INJECT 140 MG INTO THE SKIN EVERY 14 (FOURTEEN) DAYS. 12/07/21   Pixie Casino, MD  ?ezetimibe (ZETIA) 10 MG tablet Take 1 tablet (10 mg total) by mouth daily. 11/17/21   Hilty, Nadean Corwin, MD  ?sacubitril-valsartan (ENTRESTO) 97-103 MG Take 1 tablet by mouth 2 (two) times daily. 02/08/22   Lenna Sciara, NP  ?sildenafil (VIAGRA) 50 MG tablet TAKE 1 TABLET BY MOUTH DAILY AS NEEDED FOR ERECTILE DYSFUNCTION 11/17/21   Pixie Casino, MD  ?testosterone cypionate (DEPOTESTOSTERONE CYPIONATE) 200 MG/ML injection Inject 300 mg into the muscle every Wednesday.  01/05/16   [provider]  ?traMADol (ULTRAM) 50 MG tablet Take 50 mg by mouth every 6 (six) hours as needed.    [provider]  ? ? ?Allergies  ?Allergen Reactions  ? Tetracyclines & Related Nausea And Vomiting  ? ? ?Social History  ? ?Socioeconomic History  ? Marital status: Married  ?  Spouse name: Mary  ? Number of children: Not on file  ? Years of education: Not on file  ? Highest education level: Some college, no degree  ?Occupational History  ?   Comment: self employed  ?Tobacco Use  ? Smoking status: Former  ?  Types: Cigars  ?  Quit date: 06/26/2012  ?  Years since quitting: 9.7  ? Smokeless tobacco: Never  ? Tobacco comments:  ?  maybe 2 cigars a year  ?Vaping Use  ? Vaping Use: Never used  ?Substance and Sexual Activity  ? Alcohol use: Not Currently  ? Drug use: Not Currently  ? Sexual activity: Yes  ?  Birth control/protection: None  ?Other Topics Concern  ? Not on file  ?Social History Narrative  ? Lives with wife  ? [place], [place]  ?    ? ?Social Determinants of Health  ? ?Financial Resource Strain: Not on file  ?Food Insecurity: Not on file  ?Transportation Needs: Not on file  ?Physical Activity: Not on file  ?Stress: Not on file  ?Social Connections: Not on file  ?Intimate Partner Violence: Not on file  ? ? ?  Tobacco Use: Medium Risk  ? Smoking Tobacco Use: Former  ? Smokeless Tobacco Use: Never  ? Passive Exposure: Not on file  ? ?Social History  ? ?Substance and Sexual Activity  ?Alcohol Use Not Currently  ? ? ?Family History  ?Problem Relation Age of Onset  ? Heart attack Mother   ? Stroke Mother   ? Diabetes Mother   ? Heart attack Father   ? Cancer Father   ?     lung  ? ? ?ROS: ?Constitutional: no fever, no chills, no night sweats, no significant weight loss ?Cardiovascular: no chest pain, no palpitations ?Respiratory: no cough, no shortness of breath, No COPD ?Gastrointestinal: no vomiting, no nausea ?Musculoskeletal: no swelling in Joints, Joint Pain ?Neurologic: no numbness, no tingling, no difficulty with balance ? ? ? ?Objective: ? ?Physical Exam: ?Well nourished and well developed.  ?General: Alert and oriented x3, cooperative and pleasant, no acute distress.  ?Head: normocephalic, atraumatic, neck supple.  ?Eyes: EOMI.  ?Respiratory: breath sounds clear in all fields, no wheezing, rales, or rhonchi. ?Cardiovascular: Regular rate and rhythm, no murmurs, gallops or rubs.  ?Abdomen: non-tender to palpation and soft, normoactive bowel  sounds. ?Musculoskeletal: ? ? ? Right Hip Exam:  ? Range of motion: Flexion to 90 degrees, internal rotation to 0 degrees, external rotation to 0 degrees, and abduction to 20 degrees without discomfort. Any range of motion

## 2022-04-06 ENCOUNTER — Ambulatory Visit: Payer: Medicare Other | Admitting: Student

## 2022-04-11 NOTE — Progress Notes (Signed)
Surgery orders requested via Epic inbox. °

## 2022-04-14 NOTE — Patient Instructions (Addendum)
DUE TO COVID-19 ONLY TWO VISITORS  (aged 58 and older)  IS ALLOWED TO COME WITH YOU AND STAY IN THE WAITING ROOM ONLY DURING PRE OP AND PROCEDURE.   ?**NO VISITORS ARE ALLOWED IN THE SHORT STAY AREA OR RECOVERY ROOM!!** ? ?IF YOU WILL BE ADMITTED INTO THE HOSPITAL YOU ARE ALLOWED ONLY FOUR SUPPORT PEOPLE DURING VISITATION HOURS ONLY (7 AM -8PM)   ?The support person(s) must pass our screening, gel in and out ?Visitors GUEST BADGE MUST BE WORN VISIBLY  ?One adult visitor may remain with you overnight and MUST be in the room by 8 P.M.  ? ?You are not required to quarantine ?Hand Hygiene often ?Do NOT share personal items ?Notify your provider if you are in close contact with someone who has COVID or you develop fever 100.4 or greater, new onset of sneezing, cough, sore throat, shortness of breath or body aches. ?     ? Your procedure is scheduled on:  04-27-22 ? ? Report to [facility] Main Entrance ? ?  Report to admitting at 6:30 AM ? ? Call this number if you have problems the morning of surgery [phone] ? ? Do not eat food :After Midnight. ? ? After Midnight you may have the following liquids until 5:30 AM DAY OF SURGERY ? ?Water ?Black Coffee (sugar ok, NO MILK/CREAM OR CREAMERS)  ?Tea (sugar ok, NO MILK/CREAM OR CREAMERS) regular and decaf                             ?Plain Jell-O (NO RED)                                           ?Fruit ices (not with fruit pulp, NO RED)                                     ?Popsicles (NO RED)                                                                  ?Juice: apple, WHITE grape, WHITE cranberry ?Sports drinks like Gatorade (NO RED) ?Clear broth(vegetable,chicken,beef) ? ?             ?Drink 1 Ensure AT 5:30 AM the morning of surgery. ? ?  ?  ?The day of surgery:  ?Drink ONE (1) Pre-Surgery Clear Ensure at 5:30 AM the morning of surgery. Drink in one sitting. Do not sip.  ?This drink was given to you during your hospital  ?pre-op appointment visit. ?Nothing else  to drink after completing the Pre-Surgery Clear Ensure  ?  ?       If you have questions, please contact your surgeon?s office. ? ? ?FOLLOW ANY ADDITIONAL PRE OP INSTRUCTIONS YOU RECEIVED FROM YOUR SURGEON'S OFFICE!!! ?  ?  ?Oral Hygiene is also important to reduce your risk of infection.                                    ?  Remember - BRUSH YOUR TEETH THE MORNING OF SURGERY WITH YOUR REGULAR TOOTHPASTE ? ? Do NOT smoke after Midnight ? ? Take these medicines the morning of surgery with A SIP OF WATER: Duloxetine, Tramadol if needed ?                  ?           You may not have any metal on your body including  jewelry, and body piercing ? ?           Do not wear lotions, powders, cologne, or deodorant ? ?            Men may shave face and neck. ? ? Do not bring valuables to the hospital. [place]. ? ? Contacts, dentures or bridgework may not be worn into surgery. ? ? Bring small overnight bag day of surgery. ?  ?Special Instructions: Bring a copy of your healthcare power of attorney and living will documents         the day of surgery if you haven't scanned them before. ? ?Please read over the following fact sheets you were given: IF [place] [phone] ? ?[place] - Preparing for Surgery ?Before surgery, you can play an important role.  Because skin is not sterile, your skin needs to be as free of germs as possible.  You can reduce the number of germs on your skin by washing with CHG (chlorahexidine gluconate) soap before surgery.  CHG is an antiseptic cleaner which kills germs and bonds with the skin to continue killing germs even after washing. ?Please DO NOT use if you have an allergy to CHG or antibacterial soaps.  If your skin becomes reddened/irritated stop using the CHG and inform your nurse when you arrive at Short Stay. ?Do not shave (including legs and underarms) for at least 48 hours prior to the first CHG  shower.  You may shave your face/neck. ? ?Please follow these instructions carefully: ? 1.  Shower with CHG Soap the night before surgery and the  morning of surgery. ? 2.  If you choose to wash your hair, wash your hair first as usual with your normal  shampoo. ? 3.  After you shampoo, rinse your hair and body thoroughly to remove the shampoo.                            ? 4.  Use CHG as you would any other liquid soap.  You can apply chg directly to the skin and wash.  Gently with a scrungie or clean washcloth. ? 5.  Apply the CHG Soap to your body ONLY FROM THE NECK DOWN.   Do   not use on face/ open      ?                     Wound or open sores. Avoid contact with eyes, ears mouth and   genitals (private parts).  ?                     Production manager,  Genitals (private parts) with your normal soap. ?            6.  Wash thoroughly, paying special attention to the area where your    surgery  will be performed. ? 7.  Thoroughly rinse your body with warm water from the neck down. ? 8.  DO NOT shower/wash with your normal soap after using and rinsing off the CHG Soap. ?               9.  Pat yourself dry with a clean towel. ?           10.  Wear clean pajamas. ?           11.  Place clean sheets on your bed the night of your first shower and do not  sleep with pets. ?Day of Surgery : ?Do not apply any lotions/deodorants the morning of surgery.  Please wear clean clothes to the hospital/surgery center. ? ?FAILURE TO FOLLOW THESE INSTRUCTIONS MAY RESULT IN THE CANCELLATION OF YOUR SURGERY ? ?PATIENT SIGNATURE_________________________________ ? ?NURSE SIGNATURE__________________________________ ? ?________________________________________________________________________  ?  ? ?Incentive Spirometer ? ?An incentive spirometer is a tool that can help keep your lungs clear and active. This tool measures how well you are filling your lungs with each breath. Taking long deep breaths may help reverse or decrease the chance of  developing breathing (pulmonary) problems (especially infection) following: ?A long period of time when you are unable to move or be active. ?BEFORE THE PROCEDURE  ?If the spirometer includes an indicator to show your best effort, your nurse or respiratory therapist will set it to a desired goal. ?If possible, sit up straight or lean slightly forward. Try not to slouch. ?Hold the incentive spirometer in an upright position. ?INSTRUCTIONS FOR USE  ?Sit on the edge of your bed if possible, or sit up as far as you can in bed or on a chair. ?Hold the incentive spirometer in an upright position. ?Breathe out normally. ?Place the mouthpiece in your mouth and seal your lips tightly around it. ?Breathe in slowly and as deeply as possible, raising the piston or the ball toward the top of the column. ?Hold your breath for 3-5 seconds or for as long as possible. Allow the piston or ball to fall to the bottom of the column. ?Remove the mouthpiece from your mouth and breathe out normally. ?Rest for a few seconds and repeat Steps 1 through 7 at least 10 times every 1-2 hours when you are awake. Take your time and take a few normal breaths between deep breaths. ?The spirometer may include an indicator to show your best effort. Use the indicator as a goal to work toward during each repetition. ?After each set of 10 deep breaths, practice coughing to be sure your lungs are clear. If you have an incision (the cut made at the time of surgery), support your incision when coughing by placing a pillow or rolled up towels firmly against it. ?Once you are able to get out of bed, walk around indoors and cough well. You may stop using the incentive spirometer when instructed by your caregiver.  ?RISKS AND COMPLICATIONS ?Take your time so you do not get dizzy or light-headed. ?If you are in pain, you may need to take or ask for pain medication before doing incentive spirometry. It is harder to take a deep breath if you are having pain. ?AFTER  USE ?Rest and breathe slowly and easily. ?It can be helpful to keep track of a log of your progress. Your caregiver can provide you with a simple table to help with this. ?If you are using the spirometer a

## 2022-04-14 NOTE — Progress Notes (Addendum)
COVID Vaccine Completed: ?Date COVID Vaccine completed: ?Has received booster: ?COVID vaccine manufacturer: [place]  ? ?Date of COVID positive in last 90 days: ? ?PCP - Ledora Bottcher, MD ?Cardiologist - Lyman Bishop, MD ? ?Cardiac clearance in Epic dated 02-08-22 by Diona Browner, NP ? ?Chest x-ray -  ?EKG - 02-08-22 Epic ?Stress Test - greater than 2 years Epic ?ECHO - 09-02-20 Epic ?Cardiac Cath - greater than 2 years Epic ?Pacemaker/ICD device last checked: ?Spinal Cord Stimulator: ?Long Term Monitor - 2021 Epic ? ?Bowel Prep -  ? ?Sleep Study -  ?CPAP -  ? ?Fasting Blood Sugar -  ?Checks Blood Sugar _____ times a day ? ?Blood Thinner Instructions: ?Aspirin Instructions:  ASA 81 mg.  Okay to hold 5-7 days per clearance ?Last Dose: ? ?Activity level:  Can go up a flight of stairs and perform activities of daily living without stopping and without symptoms of chest pain or shortness of breath. ?  Able to exercise without symptoms ? ?Unable to go up a flight of stairs without symptoms of  ?   ? ?Anesthesia review: CAD, Syndrome X, HTN, heart failure, ischemic cardiomyopathy.  Hx of CABG.  CKD ? ?Patient denies shortness of breath, fever, cough and chest pain at PAT appointment ? ? ?Patient verbalized understanding of instructions that were given to them at the PAT appointment. Patient was also instructed that they will need to review over the PAT instructions again at home before surgery.  ?

## 2022-04-15 ENCOUNTER — Encounter (HOSPITAL_COMMUNITY): Payer: Self-pay

## 2022-04-15 ENCOUNTER — Encounter (HOSPITAL_COMMUNITY)
Admission: RE | Admit: 2022-04-15 | Discharge: 2022-04-15 | Disposition: A | Discharge status: Home / Self Care | Payer: Medicare Other | Source: Ambulatory Visit | Attending: Orthopedic Surgery | Admitting: Orthopedic Surgery

## 2022-04-15 ENCOUNTER — Other Ambulatory Visit: Payer: Self-pay

## 2022-04-15 VITALS — BP 169/107 | HR 62 | Temp 97.7°F | Resp 18 | Ht 69.0 in | Wt 222.2 lb

## 2022-04-15 DIAGNOSIS — I2510 Atherosclerotic heart disease of native coronary artery without angina pectoris: Secondary | ICD-10-CM | POA: Insufficient documentation

## 2022-04-15 DIAGNOSIS — Z01812 Encounter for preprocedural laboratory examination: Secondary | ICD-10-CM | POA: Diagnosis present

## 2022-04-15 DIAGNOSIS — Z01818 Encounter for other preprocedural examination: Secondary | ICD-10-CM

## 2022-04-15 LAB — BASIC METABOLIC PANEL
Anion gap: 7 (ref 5–15)
BUN: 26 mg/dL — ABNORMAL HIGH (ref 6–20)
CO2: 26 mmol/L (ref 22–32)
Calcium: 9.1 mg/dL (ref 8.9–10.3)
Chloride: 103 mmol/L (ref 98–111)
Creatinine, Ser: 1.28 mg/dL — ABNORMAL HIGH (ref 0.61–1.24)
GFR, Estimated: >60 mL/min (ref >60)
Glucose, Bld: 96 mg/dL (ref 70–99)
Potassium: 3.6 mmol/L (ref 3.5–5.1)
Sodium: 136 mmol/L (ref 135–145)

## 2022-04-15 LAB — TYPE AND SCREEN
ABO/RH(D): A POS
Antibody Screen: NEGATIVE

## 2022-04-15 LAB — SURGICAL PCR SCREEN
MRSA, PCR: NEGATIVE
Staphylococcus aureus: NEGATIVE

## 2022-04-15 LAB — CBC
HCT: 50.2 % (ref 39.0–52.0)
Hemoglobin: 16.9 g/dL (ref 13.0–17.0)
MCH: 29.9 pg (ref 26.0–34.0)
MCHC: 33.7 g/dL (ref 30.0–36.0)
MCV: 88.7 fL (ref 80.0–100.0)
Platelets: 253 10*3/uL (ref 150–400)
RBC: 5.66 MIL/uL (ref 4.22–5.81)
RDW: 12.6 % (ref 11.5–15.5)
WBC: 5.9 10*3/uL (ref 4.0–10.5)
nRBC: 0 % (ref 0.0–0.2)

## 2022-04-15 NOTE — Progress Notes (Addendum)
COVID Vaccine Completed: ?Date COVID Vaccine completed: ?Has received booster: ?COVID vaccine manufacturer: [place]  ?  ?Date of COVID positive in last 90 days: ?  ?PCP - Ledora Bottcher, MD ?Cardiologist - Lyman Bishop, MD ?  ?Cardiac clearance in Epic dated 02-08-22 by Diona Browner, NP ?  ?Chest x-ray -  ?EKG - 02-08-22 Epic ?Stress Test - greater than 2 years Epic ?ECHO - 09-02-20 Epic ?Cardiac Cath - greater than 2 years Epic ?Pacemaker/ICD device last checked:N/A ?Spinal Cord Stimulator:N/A ?Long Term Monitor - 2021 Epic ?  ?Bowel Prep - N/A ?  ?Sleep Study - N/A ?CPAP - N/A ?  ?Fasting Blood Sugar - N/A ?Checks Blood Sugar ___N/A__ times a day ?  ?Blood Thinner Instructions: ?Aspirin Instructions:  ASA 81 mg.  Okay to hold 5-7 days per clearance ?Last Dose: 04/15/2022 ?  ?Activity level:   Able to exercise without symptoms ?  ?                         ?  ?Anesthesia review: CAD, Syndrome X, HTN, heart failure, ischemic cardiomyopathy.  Hx of CABG.  CKD ?  ?Patient denies shortness of breath, fever, cough and chest pain at PAT appointment ?  ?  ?Patient verbalized understanding of instructions that were given to them at the PAT appointment. Patient was also instructed that they will need to review over the PAT instructions again at home before surgery.  ?

## 2022-04-18 ENCOUNTER — Other Ambulatory Visit: Payer: Self-pay | Admitting: Internal Medicine

## 2022-04-20 NOTE — Progress Notes (Addendum)
Anesthesia Chart Review ? ? Case: [id number] Date/Time: 04/27/22 0815  ? Procedure: TOTAL HIP ARTHROPLASTY ANTERIOR APPROACH (Right: Hip)  ? Anesthesia type: Choice  ? Pre-op diagnosis: right hip osteoarthritis  ? [facility] ROOM 10 / [facility]  ? Surgeons: Gaynelle Arabian, MD  ? ?  ? ? ?DISCUSSION:55 y.o. former smoker with h/o PONV, HTN, CAD s/p CABG x4 (LIMA-LAD, SVG-OM, SVG to RPDA, and RPL), chornic combined systolic and diastolic heart failure, CVA, right hip OA scheduled for above procedure 04/27/2022 with Dr. Gaynelle Arabian.  ? ?Pt last seen by cardiology 02/08/2022. Per OV note, "Preoperative cardiac evaluation: He is awaiting right hip total arthroplasty by Dr. Gaynelle Arabian scheduled for May 2023 though he is hoping to have this done sooner schedule permits.  According to the Revised Cardiac Risk Index (RCRI), his Perioperative Risk of Major Cardiac Event is (%): 11. His Functional Capacity in METs is: 7.99 according to the Duke Activity Status Index (DASI). Therefore, based on ACC/AHA guidelines, patient would be at acceptable risk for the planned procedure without further cardiovascular testing. I will route this recommendation to the requesting party via Epic fax function.  Patient takes ASA 81 mg daily.  Ideally, he would continue ASA throughout perioperative period, however, if required, he may hold ASA 81 mg for 5 to 7 days prior to surgery. Please resume ASA as soon as possible in the postoperative period at the discretion of the surgeon." ? ?Elevated BP at PAT visit. Pt advised to contact PCP.  Will evaluate DOS.  ? ?BP Readings from Last 3 Encounters:  ?04/15/22 (!) 169/107  ?02/08/22 (!) 172/98  ?11/17/21 132/84  ?  ?Anticipate pt can proceed with planned procedure barring acute status change.   ?VS: BP (!) 169/107   Pulse 62   Temp 36.5 ?C (Oral)   Resp 18   Ht '5\' 9"'$  (1.753 m)   Wt 100.8 kg   SpO2 97%   BMI 32.82 kg/m?  ? ?PROVIDERS: ?Corrington, Kip A, MD is PCP  ? ?Cardiologist - Lyman Bishop, MD ?LABS: Labs reviewed: Acceptable for surgery. ?(all labs ordered are listed, but only abnormal results are displayed) ? ?Labs Reviewed  ?BASIC METABOLIC PANEL - Abnormal; Notable for the following components:  ?    Result Value  ? BUN 26 (*)   ? Creatinine, Ser 1.28 (*)   ? All other components within normal limits  ?SURGICAL PCR SCREEN  ?CBC  ?TYPE AND SCREEN  ? ? ? ?IMAGES: ? ? ?EKG: ?02/08/2022 ?Rate 65 bpm  ?NSR ?Possible left atrial enlargement ?LAD ?Nonspecific intraventricular block  ? ?CV: ?Echo 09/02/2020 ?1. Left ventricular ejection fraction, by estimation, is 55 to 60%. The  ?left ventricle has normal function. The left ventricle has no regional  ?wall motion abnormalities. There is moderate left ventricular hypertrophy.  ?Left ventricular diastolic  ?parameters are consistent with Grade I diastolic dysfunction (impaired  ?relaxation).  ? 2. Right ventricular systolic function is normal. The right ventricular  ?size is normal.  ? 3. The mitral valve is normal in structure. Trivial mitral valve  ?regurgitation. No evidence of mitral stenosis.  ? 4. The aortic valve is normal in structure. Aortic valve regurgitation is  ?not visualized. No aortic stenosis is present.  ? 5. The inferior vena cava is normal in size with greater than 50%  ?respiratory variability, suggesting right atrial pressure of 3 mmHg.  ? ?Myocardial Perfusion 03/16/2018 ?Nuclear stress EF: 46%. ?There was no ST segment deviation noted during  stress. ?Findings consistent with prior myocardial infarction. ?This is an intermediate risk study. ?The left ventricular ejection fraction is mildly decreased (45-54%). ?  ? Moderate size inferior lateral wall infarct at mid and basal level ?No ischemia EF estimated at 46% ?Past Medical History:  ?Diagnosis Date  ? Arthritis   ? CAD (coronary artery disease)   ? a. 02/2014 Abnl MV with defect in LCX territory;  b. 02/2014 Cath: LAD 60-70p, 77m LCX 100 CTO, OM1 95p, 99d, RCA 100 CTO w/ L->R  collaterals; c. 02/2014 CABG x 4 (LIMA->LAD, VG->OM, VG->RPDA->RPL); d. 01/2016 MV: 41%, med size, sev intensity fixed basal inferior lateral defect w/o ischemia.  ? CAD (coronary artery disease)   ? [place]id arterial disease ([place]   ? a. 06/2013 Carotid U/S: moderate narrowing of both subclavian arteries, with normal carotid arteries;  b. 02/2014 Carotid U/S: 1-39% bilat ICA stenosis.  ? Depression   ? Fracture   ? nasal bone  ? History of echocardiogram   ? a. 02/2016 Echo: EF 50-55%, no rwma, mildly dil LA, nl RV fxn, mildly dil RA.  ? History of kidney stones   ? Hyperlipidemia   ? Hypertension   ? Hypertensive heart disease   ? PONV (postoperative nausea and vomiting)   ? Syndrome X, cardiac ([facility]   ? a. Previously felt to have microvascular angina following remote cath in [place]-->treated with nitrates, ranexa, EECP.  Later found to have multivessel CAD.  ? Wears glasses   ? ? ?Past Surgical History:  ?Procedure Laterality Date  ? CARDIAC CATHETERIZATION  2005  ? CLOSED REDUCTION NASAL FRACTURE N/A 03/27/2020  ? Procedure: CLOSED REDUCTION NASAL BONES;  Surgeon: TIrene Limbo MD;  [place] [facility]  Service: [place]astics;  Laterality: N/A;  ? COLONOSCOPY    ? CORONARY ARTERY BYPASS GRAFT N/A 03/18/2014  ? Procedure: CORONARY ARTERY BYPASS GRAFTING (CABG) x 4 using endoscopically harvested right saphenous vein and left internal mammary artery;  Surgeon: BGaye Pollack MD;  [facility];  Service: Open Heart Surgery;  Laterality: N/A;  ? HAND SURGERY Left   ? INTRAOPERATIVE TRANSESOPHAGEAL ECHOCARDIOGRAM N/A 03/18/2014  ? Procedure: INTRAOPERATIVE TRANSESOPHAGEAL ECHOCARDIOGRAM;  Surgeon: BGaye Pollack MD;  [facility];  Service: Open Heart Surgery;  Laterality: N/A;  ? KIDNEY STONE SURGERY  age 56 ? LEFT HEART CATHETERIZATION WITH CORONARY ANGIOGRAM N/A 03/14/2014  ? Procedure: LEFT HEART CATHETERIZATION WITH CORONARY ANGIOGRAM;  Surgeon: TTroy Sine MD;  [facility];  Service:  Cardiovascular;  Laterality: N/A;  ? SHOULDER ARTHROSCOPY  2001  ? L shoulder  ? TOTAL HIP ARTHROPLASTY Left 10/26/2016  ? Procedure: LEFT TOTAL HIP ARTHROPLASTY ANTERIOR APPROACH;  Surgeon: FGaynelle Arabian MD;  [facility];  Service: Orthopedics;  Laterality: Left;  ? ? ?MEDICATIONS: ? amphetamine-dextroamphetamine (ADDERALL XR) 30 MG 24 hr capsule  ? aspirin EC 81 MG tablet  ? atorvastatin (LIPITOR) 80 MG tablet  ? cyanocobalamin (,VITAMIN B-12,) 1000 MCG/ML injection  ? cyclobenzaprine (FLEXERIL) 10 MG tablet  ? DULoxetine (CYMBALTA) 60 MG capsule  ? ezetimibe (ZETIA) 10 MG tablet  ? meloxicam (MOBIC) 15 MG tablet  ? Nutritional Supplements (DHEA PO)  ? REPATHA SURECLICK 1660MG/ML SOAJ  ? sacubitril-valsartan (ENTRESTO) 97-103 MG  ? sildenafil (VIAGRA) 50 MG tablet  ? testosterone cypionate (DEPOTESTOSTERONE CYPIONATE) 200 MG/ML injection  ? traMADol (ULTRAM) 50 MG tablet  ? zolpidem (AMBIEN) 10 MG tablet  ? ?No current facility-administered medications for this encounter.  ? ? ?JJanett Billow  Jamont Mellin Ward, PA-C ?[facility] ?[phone] ? ? ? ? ? ?

## 2022-04-20 NOTE — Anesthesia Preprocedure Evaluation (Addendum)
Anesthesia Evaluation  ?Patient identified by MRN, date of birth, ID band ?Patient awake ? ? ? ?Reviewed: ?Allergy & Precautions, H&P , NPO status , Patient's Chart, lab work & pertinent test results ? ?History of Anesthesia Complications ?(+) history of anesthetic complications ? ?Airway ?Mallampati: I ? ?TM Distance: >3 FB ?Neck ROM: Full ? ? ? Dental ?no notable dental hx. ?(+) Teeth Intact, Dental Advisory Given ?  ?Pulmonary ?neg pulmonary ROS, former smoker,  ?  ?Pulmonary exam normal ?breath sounds clear to auscultation ? ? ? ? ? ? Cardiovascular ?Exercise Tolerance: Good ?hypertension, Pt. on medications ?+ CAD and + CABG  ?Normal cardiovascular exam ?Rhythm:Regular Rate:Normal ? ? ?  ?Neuro/Psych ?PSYCHIATRIC DISORDERS Depression negative neurological ROS ?   ? GI/Hepatic ?negative GI ROS, Neg liver ROS,   ?Endo/Other  ?Morbid obesity ? Renal/GU ?negative Renal ROS  ?negative genitourinary ?  ?Musculoskeletal ? ?(+) Arthritis , Osteoarthritis,   ? Abdominal ?  ?Peds ?negative pediatric ROS ?(+)  Hematology ?negative hematology ROS ?(+)   ?Anesthesia Other Findings ? ? Reproductive/Obstetrics ?negative OB ROS ? ?  ? ? ? ? ? ? ? ? ? ? ? ? ? ?  ?  ? ? ? ? ? ?Anesthesia Physical ?Anesthesia Plan ? ?ASA: 3 ? ?Anesthesia Plan: General  ? ?Post-op Pain Management: Minimal or no pain anticipated, Tylenol PO (pre-op)* and Celebrex PO (pre-op)*  ? ?Induction: Intravenous ? ?PONV Risk Score and Plan: Ondansetron, Treatment may vary due to age or medical condition and Dexamethasone ? ?Airway Management Planned: Oral ETT and LMA ? ?Additional Equipment: None ? ?Intra-op Plan:  ? ?Post-operative Plan: Extubation in OR ? ?Informed Consent: I have reviewed the patients History and Physical, chart, labs and discussed the procedure including the risks, benefits and alternatives for the proposed anesthesia with the patient or authorized representative who has indicated his/her understanding  and acceptance.  ? ? ? ? ? ?Plan Discussed with: Anesthesiologist and CRNA ? ?Anesthesia Plan Comments: (See PAT note 04/15/22, Konrad Felix Ward, PA-C ?DISCUSSION:55 y.o. former smoker with h/o PONV, HTN, CAD s/p CABG x4?(LIMA-LAD, SVG-OM, SVG to RPDA, and RPL), chornic combined systolic and diastolic heart failure, CVA, right hip OA scheduled for above procedure 04/27/2022 with Dr. Gaynelle Arabian.  ??Pt last seen by cardiology 02/08/2022. Per OV note, "Preoperative cardiac evaluation:?He is awaiting right hip total arthroplasty by Dr. Pilar Plate?[name]?scheduled for May 2023 though he is hoping to have this done sooner schedule permits. ?According to the Revised Cardiac Risk Index (RCRI),?his?Perioperative Risk of Major Cardiac Event is (%): 11.?His?Functional Capacity in METs is: 7.99?according to the Duke Activity Status Index (DASI). Therefore, based on ACC/AHA guidelines, patient would be at acceptable risk for the planned procedure without further cardiovascular testing.  ?EKG: ?02/08/2022 ?Rate 65 bpm  ?NSR ?Possible left atrial enlargement ?LAD ?Nonspecific intraventricular block  ?? ?CV: ?Echo 09/02/2020 ?1. Left ventricular ejection fraction, by estimation, is 55 to 60%. The  ?left ventricle has normal function. The left ventricle has no regional  ?wall motion abnormalities. There is moderate left ventricular hypertrophy.  ?Left ventricular diastolic  ?parameters are consistent with Grade I diastolic dysfunction (impaired  ?relaxation).  ??2. Right ventricular systolic function is normal. The right ventricular  ?size is normal.  ??3. The mitral valve is normal in structure. Trivial mitral valve  ?regurgitation. No evidence of mitral stenosis.  ??4. The aortic valve is normal in structure. Aortic valve regurgitation is  ?not visualized. No aortic stenosis is present.  ??5. The inferior vena  cava is normal in size with greater than 50%  ?respiratory variability, suggesting right atrial pressure of 3 mmHg.   ?? ?Myocardial Perfusion 03/16/2018 ?Nuclear stress EF: 46%. ?There was no ST segment deviation noted during stress. ?Findings consistent with prior myocardial infarction. ?This is an intermediate risk study. ?The left ventricular ejection fraction is mildly decreased (45-54%).)  ? ? ? ?Anesthesia Quick Evaluation ? ?

## 2022-04-27 ENCOUNTER — Encounter (HOSPITAL_COMMUNITY): Payer: Self-pay | Admitting: Orthopedic Surgery

## 2022-04-27 ENCOUNTER — Ambulatory Visit (HOSPITAL_COMMUNITY): Payer: Medicare Other

## 2022-04-27 ENCOUNTER — Observation Stay (HOSPITAL_COMMUNITY): Payer: Medicare Other

## 2022-04-27 ENCOUNTER — Encounter (HOSPITAL_COMMUNITY)
Admission: RE | Disposition: A | Discharge status: Home / Self Care | Payer: Self-pay | Source: Home / Self Care | Attending: Orthopedic Surgery

## 2022-04-27 ENCOUNTER — Ambulatory Visit (HOSPITAL_COMMUNITY): Payer: Medicare Other | Admitting: Physician Assistant

## 2022-04-27 ENCOUNTER — Observation Stay (HOSPITAL_COMMUNITY)
Admission: RE | Admit: 2022-04-27 | Discharge: 2022-04-28 | Disposition: A | Discharge status: Home / Self Care | Payer: Medicare Other | Attending: Orthopedic Surgery | Admitting: Orthopedic Surgery

## 2022-04-27 ENCOUNTER — Ambulatory Visit (HOSPITAL_BASED_OUTPATIENT_CLINIC_OR_DEPARTMENT_OTHER): Payer: Medicare Other | Admitting: Physician Assistant

## 2022-04-27 ENCOUNTER — Other Ambulatory Visit: Payer: Self-pay

## 2022-04-27 DIAGNOSIS — I5022 Chronic systolic (congestive) heart failure: Secondary | ICD-10-CM | POA: Insufficient documentation

## 2022-04-27 DIAGNOSIS — Z7982 Long term (current) use of aspirin: Secondary | ICD-10-CM | POA: Diagnosis not present

## 2022-04-27 DIAGNOSIS — Z96642 Presence of left artificial hip joint: Secondary | ICD-10-CM | POA: Insufficient documentation

## 2022-04-27 DIAGNOSIS — M1611 Unilateral primary osteoarthritis, right hip: Principal | ICD-10-CM | POA: Insufficient documentation

## 2022-04-27 DIAGNOSIS — I2510 Atherosclerotic heart disease of native coronary artery without angina pectoris: Secondary | ICD-10-CM

## 2022-04-27 DIAGNOSIS — I10 Essential (primary) hypertension: Secondary | ICD-10-CM | POA: Diagnosis not present

## 2022-04-27 DIAGNOSIS — I110 Hypertensive heart disease with heart failure: Secondary | ICD-10-CM | POA: Diagnosis not present

## 2022-04-27 DIAGNOSIS — Z951 Presence of aortocoronary bypass graft: Secondary | ICD-10-CM | POA: Diagnosis not present

## 2022-04-27 DIAGNOSIS — Z79899 Other long term (current) drug therapy: Secondary | ICD-10-CM | POA: Diagnosis not present

## 2022-04-27 DIAGNOSIS — M1990 Unspecified osteoarthritis, unspecified site: Secondary | ICD-10-CM | POA: Diagnosis not present

## 2022-04-27 HISTORY — PX: TOTAL HIP ARTHROPLASTY: SHX124

## 2022-04-27 SURGERY — ARTHROPLASTY, HIP, TOTAL, ANTERIOR APPROACH
Anesthesia: General | Site: Hip | Laterality: Right

## 2022-04-27 MED ORDER — OXYCODONE HCL 5 MG/5ML PO SOLN: 5.0000 mg | Freq: Once | ORAL | Status: DC | PRN

## 2022-04-27 MED ORDER — DOCUSATE SODIUM 100 MG PO CAPS
100.0000 mg | ORAL_CAPSULE | Freq: Two times a day (BID) | ORAL | Status: DC
  Administered 2022-04-27 – 2022-04-28 (×2): 100 mg via ORAL
  Filled 2022-04-27 (×2): qty 1

## 2022-04-27 MED ORDER — BISACODYL 10 MG RE SUPP: 10.0000 mg | Freq: Every day | RECTAL | Status: DC | PRN

## 2022-04-27 MED ORDER — DEXAMETHASONE SODIUM PHOSPHATE 10 MG/ML IJ SOLN
INTRAMUSCULAR | Status: AC
  Filled 2022-04-27: qty 1

## 2022-04-27 MED ORDER — POVIDONE-IODINE 10 % EX SWAB
2.0000 "application " | Freq: Once | CUTANEOUS | Status: AC
  Administered 2022-04-27: 2 via TOPICAL

## 2022-04-27 MED ORDER — ROCURONIUM BROMIDE 100 MG/10ML IV SOLN
INTRAVENOUS | Status: DC | PRN
  Administered 2022-04-27: 60 mg via INTRAVENOUS

## 2022-04-27 MED ORDER — PROPOFOL 1000 MG/100ML IV EMUL
INTRAVENOUS | Status: AC
  Filled 2022-04-27: qty 100

## 2022-04-27 MED ORDER — HYDROMORPHONE HCL 1 MG/ML IJ SOLN
0.2500 mg | INTRAMUSCULAR | Status: DC | PRN
  Administered 2022-04-27: 0.5 mg via INTRAVENOUS

## 2022-04-27 MED ORDER — PHENYLEPHRINE 80 MCG/ML (10ML) SYRINGE FOR IV PUSH (FOR BLOOD PRESSURE SUPPORT)
PREFILLED_SYRINGE | INTRAVENOUS | Status: AC
  Filled 2022-04-27: qty 10

## 2022-04-27 MED ORDER — HYDROCODONE-ACETAMINOPHEN 5-325 MG PO TABS
ORAL_TABLET | ORAL | Status: AC
  Filled 2022-04-27: qty 1

## 2022-04-27 MED ORDER — SUGAMMADEX SODIUM 200 MG/2ML IV SOLN
INTRAVENOUS | Status: DC | PRN
  Administered 2022-04-27: 200 mg via INTRAVENOUS

## 2022-04-27 MED ORDER — AMPHETAMINE-DEXTROAMPHET ER 10 MG PO CP24
30.0000 mg | ORAL_CAPSULE | Freq: Every day | ORAL | Status: DC
  Administered 2022-04-28: 30 mg via ORAL
  Filled 2022-04-27: qty 3

## 2022-04-27 MED ORDER — OXYCODONE HCL 5 MG PO TABS: 5.0000 mg | ORAL_TABLET | Freq: Once | ORAL | Status: DC | PRN

## 2022-04-27 MED ORDER — SODIUM CHLORIDE 0.9 % IV SOLN: INTRAVENOUS | Status: DC

## 2022-04-27 MED ORDER — ACETAMINOPHEN 10 MG/ML IV SOLN
1000.0000 mg | Freq: Four times a day (QID) | INTRAVENOUS | Status: DC
Start: 2022-04-27 — End: 2022-04-27
  Administered 2022-04-27: 1000 mg via INTRAVENOUS

## 2022-04-27 MED ORDER — CYCLOBENZAPRINE HCL 10 MG PO TABS
10.0000 mg | ORAL_TABLET | Freq: Three times a day (TID) | ORAL | Status: DC | PRN
  Administered 2022-04-27 – 2022-04-28 (×2): 10 mg via ORAL
  Filled 2022-04-27 (×2): qty 1

## 2022-04-27 MED ORDER — DEXAMETHASONE SODIUM PHOSPHATE 10 MG/ML IJ SOLN: 8.0000 mg | Freq: Once | INTRAMUSCULAR | Status: DC

## 2022-04-27 MED ORDER — CELECOXIB 200 MG PO CAPS
200.0000 mg | ORAL_CAPSULE | Freq: Once | ORAL | Status: AC
  Administered 2022-04-27: 200 mg via ORAL
  Filled 2022-04-27: qty 1

## 2022-04-27 MED ORDER — CEFAZOLIN SODIUM-DEXTROSE 2-4 GM/100ML-% IV SOLN
2.0000 g | INTRAVENOUS | Status: AC
  Administered 2022-04-27: 2 g via INTRAVENOUS
  Filled 2022-04-27: qty 100

## 2022-04-27 MED ORDER — ACETAMINOPHEN 500 MG PO TABS: 1000.0000 mg | ORAL_TABLET | Freq: Once | ORAL | Status: DC

## 2022-04-27 MED ORDER — ACETAMINOPHEN 325 MG PO TABS: 325.0000 mg | ORAL_TABLET | Freq: Four times a day (QID) | ORAL | Status: DC | PRN

## 2022-04-27 MED ORDER — MIDAZOLAM HCL 2 MG/2ML IJ SOLN
2.0000 mg | Freq: Once | INTRAMUSCULAR | Status: AC | PRN
  Administered 2022-04-27: 2 mg via INTRAVENOUS

## 2022-04-27 MED ORDER — PROPOFOL 10 MG/ML IV BOLUS
INTRAVENOUS | Status: AC
  Filled 2022-04-27: qty 20

## 2022-04-27 MED ORDER — AMPHETAMINE-DEXTROAMPHET ER 30 MG PO CP24: 30.0000 mg | ORAL_CAPSULE | Freq: Every day | ORAL | Status: DC

## 2022-04-27 MED ORDER — MEPERIDINE HCL 50 MG/ML IJ SOLN
INTRAMUSCULAR | Status: AC
  Filled 2022-04-27: qty 1

## 2022-04-27 MED ORDER — SACUBITRIL-VALSARTAN 97-103 MG PO TABS
1.0000 | ORAL_TABLET | Freq: Two times a day (BID) | ORAL | Status: DC
  Administered 2022-04-27 – 2022-04-28 (×2): 1 via ORAL
  Filled 2022-04-27 (×2): qty 1

## 2022-04-27 MED ORDER — DEXAMETHASONE SODIUM PHOSPHATE 10 MG/ML IJ SOLN
INTRAMUSCULAR | Status: DC | PRN
Start: 2022-04-27 — End: 2022-04-27
  Administered 2022-04-27: 8 mg via INTRAVENOUS

## 2022-04-27 MED ORDER — BUPIVACAINE-EPINEPHRINE (PF) 0.25% -1:200000 IJ SOLN
INTRAMUSCULAR | Status: DC | PRN
  Administered 2022-04-27: 30 mL

## 2022-04-27 MED ORDER — HYDROMORPHONE HCL 1 MG/ML IJ SOLN
INTRAMUSCULAR | Status: AC
  Filled 2022-04-27: qty 1

## 2022-04-27 MED ORDER — METHOCARBAMOL 500 MG IVPB - SIMPLE MED
500.0000 mg | Freq: Four times a day (QID) | INTRAVENOUS | Status: DC | PRN
  Administered 2022-04-27: 500 mg via INTRAVENOUS
  Filled 2022-04-27: qty 50

## 2022-04-27 MED ORDER — ACETAMINOPHEN 160 MG/5ML PO SOLN: 325.0000 mg | ORAL | Status: DC | PRN

## 2022-04-27 MED ORDER — MORPHINE SULFATE (PF) 2 MG/ML IV SOLN
0.5000 mg | INTRAVENOUS | Status: DC | PRN
  Administered 2022-04-27 – 2022-04-28 (×3): 1 mg via INTRAVENOUS
  Filled 2022-04-27 (×3): qty 1

## 2022-04-27 MED ORDER — MEPERIDINE HCL 50 MG/ML IJ SOLN: 6.2500 mg | INTRAMUSCULAR | Status: DC | PRN

## 2022-04-27 MED ORDER — ROCURONIUM BROMIDE 10 MG/ML (PF) SYRINGE
PREFILLED_SYRINGE | INTRAVENOUS | Status: AC
  Filled 2022-04-27: qty 10

## 2022-04-27 MED ORDER — GLYCOPYRROLATE 0.2 MG/ML IJ SOLN
INTRAMUSCULAR | Status: DC | PRN
Start: 2022-04-27 — End: 2022-04-27
  Administered 2022-04-27: .2 mg via INTRAVENOUS

## 2022-04-27 MED ORDER — METOCLOPRAMIDE HCL 5 MG/ML IJ SOLN: 5.0000 mg | Freq: Three times a day (TID) | INTRAMUSCULAR | Status: DC | PRN

## 2022-04-27 MED ORDER — CHLORHEXIDINE GLUCONATE 0.12 % MT SOLN
15.0000 mL | Freq: Once | OROMUCOSAL | Status: AC
  Administered 2022-04-27: 15 mL via OROMUCOSAL

## 2022-04-27 MED ORDER — HYDROMORPHONE HCL 1 MG/ML IJ SOLN
INTRAMUSCULAR | Status: DC | PRN
  Administered 2022-04-27: 1 mg via INTRAVENOUS
  Administered 2022-04-27 (×2): .5 mg via INTRAVENOUS

## 2022-04-27 MED ORDER — FENTANYL CITRATE (PF) 100 MCG/2ML IJ SOLN
INTRAMUSCULAR | Status: DC | PRN
  Administered 2022-04-27 (×4): 50 ug via INTRAVENOUS

## 2022-04-27 MED ORDER — FENTANYL CITRATE PF 50 MCG/ML IJ SOSY
PREFILLED_SYRINGE | INTRAMUSCULAR | Status: AC
  Administered 2022-04-27: 50 ug via INTRAVENOUS
  Filled 2022-04-27: qty 1

## 2022-04-27 MED ORDER — FENTANYL CITRATE PF 50 MCG/ML IJ SOSY: 25.0000 ug | PREFILLED_SYRINGE | INTRAMUSCULAR | Status: DC | PRN

## 2022-04-27 MED ORDER — PHENYLEPHRINE HCL (PRESSORS) 10 MG/ML IV SOLN
INTRAVENOUS | Status: DC | PRN
  Administered 2022-04-27: 160 ug via INTRAVENOUS
  Administered 2022-04-27: 80 ug via INTRAVENOUS

## 2022-04-27 MED ORDER — ONDANSETRON HCL 4 MG PO TABS: 4.0000 mg | ORAL_TABLET | Freq: Four times a day (QID) | ORAL | Status: DC | PRN

## 2022-04-27 MED ORDER — DEXMEDETOMIDINE (PRECEDEX) IN NS 20 MCG/5ML (4 MCG/ML) IV SYRINGE
PREFILLED_SYRINGE | INTRAVENOUS | Status: DC | PRN
  Administered 2022-04-27: 12 ug via INTRAVENOUS
  Administered 2022-04-27: 8 ug via INTRAVENOUS

## 2022-04-27 MED ORDER — MENTHOL 3 MG MT LOZG: 1.0000 | LOZENGE | OROMUCOSAL | Status: DC | PRN

## 2022-04-27 MED ORDER — ACETAMINOPHEN 325 MG PO TABS: 325.0000 mg | ORAL_TABLET | ORAL | Status: DC | PRN

## 2022-04-27 MED ORDER — EZETIMIBE 10 MG PO TABS
10.0000 mg | ORAL_TABLET | Freq: Every day | ORAL | Status: DC
  Administered 2022-04-28: 10 mg via ORAL
  Filled 2022-04-27: qty 1

## 2022-04-27 MED ORDER — HYDROCODONE-ACETAMINOPHEN 7.5-325 MG PO TABS
1.0000 | ORAL_TABLET | ORAL | Status: DC | PRN
  Administered 2022-04-27 – 2022-04-28 (×5): 2 via ORAL
  Filled 2022-04-27 (×6): qty 2

## 2022-04-27 MED ORDER — BUPIVACAINE-EPINEPHRINE (PF) 0.25% -1:200000 IJ SOLN
INTRAMUSCULAR | Status: AC
  Filled 2022-04-27: qty 30

## 2022-04-27 MED ORDER — METOCLOPRAMIDE HCL 5 MG PO TABS: 5.0000 mg | ORAL_TABLET | Freq: Three times a day (TID) | ORAL | Status: DC | PRN

## 2022-04-27 MED ORDER — MIDAZOLAM HCL 2 MG/2ML IJ SOLN
INTRAMUSCULAR | Status: AC
  Filled 2022-04-27: qty 2

## 2022-04-27 MED ORDER — LACTATED RINGERS IV SOLN: INTRAVENOUS | Status: DC

## 2022-04-27 MED ORDER — DEXAMETHASONE SODIUM PHOSPHATE 10 MG/ML IJ SOLN
10.0000 mg | Freq: Once | INTRAMUSCULAR | Status: AC
  Administered 2022-04-28: 10 mg via INTRAVENOUS
  Filled 2022-04-27: qty 1

## 2022-04-27 MED ORDER — TRAMADOL HCL 50 MG PO TABS: 50.0000 mg | ORAL_TABLET | Freq: Four times a day (QID) | ORAL | Status: DC | PRN

## 2022-04-27 MED ORDER — MIDAZOLAM HCL 2 MG/2ML IJ SOLN
INTRAMUSCULAR | Status: DC | PRN
Start: 2022-04-27 — End: 2022-04-27
  Administered 2022-04-27: 2 mg via INTRAVENOUS

## 2022-04-27 MED ORDER — HYDROMORPHONE HCL 2 MG/ML IJ SOLN
INTRAMUSCULAR | Status: AC
  Filled 2022-04-27: qty 1

## 2022-04-27 MED ORDER — ONDANSETRON HCL 4 MG/2ML IJ SOLN: 4.0000 mg | Freq: Once | INTRAMUSCULAR | Status: DC | PRN

## 2022-04-27 MED ORDER — METHOCARBAMOL 500 MG IVPB - SIMPLE MED
INTRAVENOUS | Status: AC
  Filled 2022-04-27: qty 50

## 2022-04-27 MED ORDER — ACETAMINOPHEN 10 MG/ML IV SOLN
1000.0000 mg | Freq: Four times a day (QID) | INTRAVENOUS | Status: DC
  Filled 2022-04-27: qty 100

## 2022-04-27 MED ORDER — 0.9 % SODIUM CHLORIDE (POUR BTL) OPTIME
TOPICAL | Status: DC | PRN
  Administered 2022-04-27: 1000 mL

## 2022-04-27 MED ORDER — EPHEDRINE SULFATE (PRESSORS) 50 MG/ML IJ SOLN
INTRAMUSCULAR | Status: DC | PRN
  Administered 2022-04-27 (×3): 10 mg via INTRAVENOUS
  Administered 2022-04-27: 5 mg via INTRAVENOUS

## 2022-04-27 MED ORDER — ONDANSETRON HCL 4 MG/2ML IJ SOLN
INTRAMUSCULAR | Status: DC | PRN
  Administered 2022-04-27: 4 mg via INTRAVENOUS

## 2022-04-27 MED ORDER — HYDROCODONE-ACETAMINOPHEN 5-325 MG PO TABS
1.0000 | ORAL_TABLET | ORAL | Status: DC | PRN
  Administered 2022-04-27: 1 via ORAL

## 2022-04-27 MED ORDER — LIDOCAINE HCL (CARDIAC) PF 100 MG/5ML IV SOSY
PREFILLED_SYRINGE | INTRAVENOUS | Status: DC | PRN
Start: 2022-04-27 — End: 2022-04-27
  Administered 2022-04-27: 100 mg via INTRAVENOUS

## 2022-04-27 MED ORDER — PROPOFOL 10 MG/ML IV BOLUS
INTRAVENOUS | Status: DC | PRN
Start: 2022-04-27 — End: 2022-04-27
  Administered 2022-04-27: 200 mg via INTRAVENOUS

## 2022-04-27 MED ORDER — LIDOCAINE HCL (PF) 2 % IJ SOLN
INTRAMUSCULAR | Status: AC
  Filled 2022-04-27: qty 5

## 2022-04-27 MED ORDER — WATER FOR IRRIGATION, STERILE IR SOLN
Status: DC | PRN
Start: 2022-04-27 — End: 2022-04-27
  Administered 2022-04-27: 2000 mL

## 2022-04-27 MED ORDER — PHENOL 1.4 % MT LIQD: 1.0000 | OROMUCOSAL | Status: DC | PRN

## 2022-04-27 MED ORDER — ORAL CARE MOUTH RINSE: 15.0000 mL | Freq: Once | OROMUCOSAL | Status: AC

## 2022-04-27 MED ORDER — DULOXETINE HCL 60 MG PO CPEP
60.0000 mg | ORAL_CAPSULE | Freq: Every day | ORAL | Status: DC
  Administered 2022-04-28: 60 mg via ORAL
  Filled 2022-04-27: qty 1

## 2022-04-27 MED ORDER — TRANEXAMIC ACID-NACL 1000-0.7 MG/100ML-% IV SOLN
1000.0000 mg | INTRAVENOUS | Status: AC
  Administered 2022-04-27: 1000 mg via INTRAVENOUS
  Filled 2022-04-27: qty 100

## 2022-04-27 MED ORDER — FENTANYL CITRATE (PF) 100 MCG/2ML IJ SOLN
INTRAMUSCULAR | Status: AC
  Filled 2022-04-27: qty 2

## 2022-04-27 MED ORDER — MEPERIDINE HCL 50 MG/ML IJ SOLN
6.2500 mg | INTRAMUSCULAR | Status: DC | PRN
  Administered 2022-04-27: 12.5 mg via INTRAVENOUS

## 2022-04-27 MED ORDER — LACTATED RINGERS IV SOLN
INTRAVENOUS | Status: DC
Start: 2022-04-27 — End: 2022-04-27

## 2022-04-27 MED ORDER — EPHEDRINE 5 MG/ML INJ
INTRAVENOUS | Status: AC
  Filled 2022-04-27: qty 10

## 2022-04-27 MED ORDER — ATORVASTATIN CALCIUM 40 MG PO TABS: 80.0000 mg | ORAL_TABLET | Freq: Every day | ORAL | Status: DC

## 2022-04-27 MED ORDER — FENTANYL CITRATE (PF) 100 MCG/2ML IJ SOLN
INTRAMUSCULAR | Status: AC
Start: 2022-04-27 — End: ?
  Filled 2022-04-27: qty 2

## 2022-04-27 MED ORDER — RIVAROXABAN 10 MG PO TABS
10.0000 mg | ORAL_TABLET | Freq: Every day | ORAL | Status: DC
  Administered 2022-04-28: 10 mg via ORAL
  Filled 2022-04-27: qty 1

## 2022-04-27 MED ORDER — POLYETHYLENE GLYCOL 3350 17 G PO PACK: 17.0000 g | PACK | Freq: Every day | ORAL | Status: DC | PRN

## 2022-04-27 MED ORDER — ONDANSETRON HCL 4 MG/2ML IJ SOLN
INTRAMUSCULAR | Status: AC
  Filled 2022-04-27: qty 2

## 2022-04-27 MED ORDER — CEFAZOLIN SODIUM-DEXTROSE 2-4 GM/100ML-% IV SOLN
2.0000 g | Freq: Four times a day (QID) | INTRAVENOUS | Status: AC
  Administered 2022-04-27 (×2): 2 g via INTRAVENOUS
  Filled 2022-04-27 (×2): qty 100

## 2022-04-27 MED ORDER — ZOLPIDEM TARTRATE 10 MG PO TABS
10.0000 mg | ORAL_TABLET | Freq: Once | ORAL | Status: AC
  Administered 2022-04-27: 10 mg via ORAL
  Filled 2022-04-27: qty 1

## 2022-04-27 MED ORDER — ONDANSETRON HCL 4 MG/2ML IJ SOLN: 4.0000 mg | Freq: Four times a day (QID) | INTRAMUSCULAR | Status: DC | PRN

## 2022-04-27 SURGICAL SUPPLY — 46 items
BAG COUNTER SPONGE SURGICOUNT (BAG) ×1 IMPLANT
BAG DECANTER FOR FLEXI CONT (MISCELLANEOUS) ×1 IMPLANT
BAG ZIPLOCK 12X15 (MISCELLANEOUS) ×1 IMPLANT
BLADE SAG 18X100X1.27 (BLADE) ×2 IMPLANT
CLSR STERI-STRIP ANTIMIC 1/2X4 (GAUZE/BANDAGES/DRESSINGS) ×1 IMPLANT
COVER PERINEAL POST (MISCELLANEOUS) ×2 IMPLANT
COVER SURGICAL LIGHT HANDLE (MISCELLANEOUS) ×2 IMPLANT
CUP ACETBLR 52 OD PINNACLE (Hips) ×1 IMPLANT
DRAPE FOOT SWITCH (DRAPES) ×2 IMPLANT
DRAPE STERI IOBAN 125X83 (DRAPES) ×2 IMPLANT
DRAPE U-SHAPE 47X51 STRL (DRAPES) ×4 IMPLANT
DRSG AQUACEL AG ADV 3.5X10 (GAUZE/BANDAGES/DRESSINGS) ×2 IMPLANT
DURAPREP 26ML APPLICATOR (WOUND CARE) ×2 IMPLANT
ELECT REM PT RETURN 15FT ADLT (MISCELLANEOUS) ×2 IMPLANT
GLOVE BIO SURGEON STRL SZ 6.5 (GLOVE) ×2 IMPLANT
GLOVE BIO SURGEON STRL SZ7.5 (GLOVE) ×3 IMPLANT
GLOVE BIO SURGEON STRL SZ8 (GLOVE) ×3 IMPLANT
GLOVE BIOGEL PI IND STRL 6.5 (GLOVE) IMPLANT
GLOVE BIOGEL PI IND STRL 7.0 (GLOVE) IMPLANT
GLOVE BIOGEL PI IND STRL 8 (GLOVE) ×1 IMPLANT
GLOVE BIOGEL PI INDICATOR 6.5 (GLOVE) ×3
GLOVE BIOGEL PI INDICATOR 7.0 (GLOVE) ×3
GLOVE BIOGEL PI INDICATOR 8 (GLOVE) ×2
GOWN STRL REUS W/ TWL LRG LVL3 (GOWN DISPOSABLE) ×1 IMPLANT
GOWN STRL REUS W/ TWL XL LVL3 (GOWN DISPOSABLE) IMPLANT
GOWN STRL REUS W/TWL LRG LVL3 (GOWN DISPOSABLE) ×4
GOWN STRL REUS W/TWL XL LVL3 (GOWN DISPOSABLE) ×2
HEAD FEMORAL 32 CERAMIC (Hips) ×1 IMPLANT
HOLDER FOLEY CATH W/STRAP (MISCELLANEOUS) ×2 IMPLANT
KIT TURNOVER KIT A (KITS) ×1 IMPLANT
LINER MARATHON NEUT +4X52X32 (Hips) ×1 IMPLANT
MANIFOLD NEPTUNE II (INSTRUMENTS) ×2 IMPLANT
PACK ANTERIOR HIP CUSTOM (KITS) ×2 IMPLANT
PENCIL SMOKE EVACUATOR COATED (MISCELLANEOUS) ×2 IMPLANT
SPIKE FLUID TRANSFER (MISCELLANEOUS) ×2 IMPLANT
SPONGE T-LAP 18X18 ~~LOC~~+RFID (SPONGE) ×2 IMPLANT
STEM FEMORAL SZ5 HIGH ACTIS (Stem) ×1 IMPLANT
STRIP CLOSURE SKIN 1/2X4 (GAUZE/BANDAGES/DRESSINGS) ×2 IMPLANT
SUT ETHIBOND NAB CT1 #1 30IN (SUTURE) ×2 IMPLANT
SUT MNCRL AB 4-0 PS2 18 (SUTURE) ×2 IMPLANT
SUT STRATAFIX 0 PDS 27 VIOLET (SUTURE) ×2
SUT VIC AB 2-0 CT1 27 (SUTURE) ×2
SUT VIC AB 2-0 CT1 TAPERPNT 27 (SUTURE) ×2 IMPLANT
SUTURE STRATFX 0 PDS 27 VIOLET (SUTURE) ×1 IMPLANT
TRAY FOLEY MTR SLVR 16FR STAT (SET/KITS/TRAYS/PACK) ×2 IMPLANT
TUBE SUCTION HIGH CAP CLEAR NV (SUCTIONS) ×2 IMPLANT

## 2022-04-27 NOTE — Transfer of Care (Signed)
Immediate Anesthesia Transfer of Care Note ? ?Patient: Kenneth [name] ? ?Procedure(s) Performed: TOTAL HIP ARTHROPLASTY ANTERIOR APPROACH (Right: Hip) ? ?Patient Location: PACU ? ?Anesthesia Type:General ? ?Level of Consciousness: awake, alert  and oriented ? ?Airway & Oxygen Therapy: Patient Spontanous Breathing ? ?Post-op Assessment: Report given to RN and Post -op Vital signs reviewed and stable ? ?Post vital signs: Reviewed and stable ? ?Last Vitals:  ?Vitals Value Taken Time  ?BP 149/91 04/27/22 1015  ?Temp    ?Pulse 103 04/27/22 1015  ?Resp 14 04/27/22 1015  ?SpO2 95 % 04/27/22 1015  ?Vitals shown include unvalidated device data. ? ?Last Pain:  ?Vitals:  ? 04/27/22 0658  ?TempSrc: Oral  ?PainSc:   ?   ? ?Patients Stated Pain Goal: 4 (04/27/22 8916) ? ?Complications: No notable events documented. ?

## 2022-04-27 NOTE — Interval H&P Note (Signed)
History and Physical Interval Note: ? ?04/27/2022 ?6:38 AM ? ?Kenneth [name]  has presented today for surgery, with the diagnosis of right hip osteoarthritis.  The various methods of treatment have been discussed with the patient and family. After consideration of risks, benefits and other options for treatment, the patient has consented to  Procedure(s): ?TOTAL HIP ARTHROPLASTY ANTERIOR APPROACH (Right) as a surgical intervention.  The patient's history has been reviewed, patient examined, no change in status, stable for surgery.  I have reviewed the patient's chart and labs.  Questions were answered to the patient's satisfaction.   ? ? ?Kenneth [name] ? ? ?

## 2022-04-27 NOTE — Op Note (Signed)
?    OPERATIVE REPORT- TOTAL HIP ARTHROPLASTY ? ? ?PREOPERATIVE DIAGNOSIS: Osteoarthritis of the Right hip.  ? ?POSTOPERATIVE DIAGNOSIS: Osteoarthritis of the Right  hip.  ? ?PROCEDURE: Right total hip arthroplasty, anterior approach.  ? ?SURGEON: Gaynelle Arabian, MD  ? ?ASSISTANT: Jaynie Bream, PA-C ? ?ANESTHESIA:  General ? ?ESTIMATED BLOOD LOSS:- 500 ml ? ?DRAINS: Hemovac x1.  ? ?COMPLICATIONS: None ?  ?CONDITION: PACU - hemodynamically stable.  ? ?BRIEF CLINICAL NOTE: Kenneth [name] is a 56 y.o. male who has advanced end-  ?stage arthritis of their Right  hip with progressively worsening pain and  ?dysfunction.The patient has failed nonoperative management and presents for  ?total hip arthroplasty.  ? ?PROCEDURE IN DETAIL: After successful administration of spinal  ?anesthetic, the traction boots for the [facility] bed were placed on both  ?feet and the patient was placed onto the [facility] bed, boots placed into the leg  ?holders. The Right hip was then isolated from the perineum with plastic  ?drapes and prepped and draped in the usual sterile fashion. ASIS and  ?greater trochanter were marked and a oblique incision was made, starting  ?at about 1 cm lateral and 2 cm distal to the ASIS and coursing towards  ?the anterior cortex of the femur. The skin was cut with a 10 blade  ?through subcutaneous tissue to the level of the fascia overlying the  ?tensor fascia lata muscle. The fascia was then incised in line with the  ?incision at the junction of the anterior third and posterior 2/3rd. The  ?muscle was teased off the fascia and then the interval between the TFL  ?and the rectus was developed. The Hohmann retractor was then placed at  ?the top of the femoral neck over the capsule. The vessels overlying the  ?capsule were cauterized and the fat on top of the capsule was removed.  ?A Hohmann retractor was then placed anterior underneath the rectus  ?femoris to give exposure to the entire anterior capsule. A  T-shaped  ?capsulotomy was performed. The edges were tagged and the femoral head  ?was identified. ?      Osteophytes are removed off the superior acetabulum.  ?The femoral neck was then cut in situ with an oscillating saw. Traction  ?was then applied to the left lower extremity utilizing the [facility]  ?traction. The femoral head was then removed. Retractors were placed  ?around the acetabulum and then circumferential removal of the labrum was  ?performed. Osteophytes were also removed. Reaming starts at 47 mm to  ?medialize and  Increased in 2 mm increments to 51 mm. We reamed in  ?approximately 40 degrees of abduction, 20 degrees anteversion. A 52 mm  ?pinnacle acetabular shell was then impacted in anatomic position under  ?fluoroscopic guidance with excellent purchase. We did not need to place  ?any additional dome screws. A 32 mm neutral + 4 marathon liner was then  ?placed into the acetabular shell.  ?     The femoral lift was then placed along the lateral aspect of the femur  ?just distal to the vastus ridge. The leg was  externally rotated and capsule  ?was stripped off the inferior aspect of the femoral neck down to the  ?level of the lesser trochanter, this was done with electrocautery. The femur was lifted after this was performed. The  leg was then placed in an extended and adducted position essentially delivering the femur. We also removed the capsule superiorly and the piriformis from the piriformis  fossa to gain excellent exposure of the  ?proximal femur. Rongeur was used to remove some cancellous bone to get  ?into the lateral portion of the proximal femur for placement of the  ?initial starter reamer. The starter broaches was placed  the starter broach  and was shown to go down the center of the canal. Broaching  ?with the Actis system was then performed starting at size 0  coursing  ?Up to size 5. A size 5 had excellent torsional and rotational  ?and axial stability. The trial high offset neck was then  placed  ?with a 32 + 1 trial head. The hip was then reduced. We confirmed that  ?the stem was in the canal both on AP and lateral x-rays. It also has excellent sizing. The hip was reduced with outstanding stability through full extension and full external rotation.. AP pelvis was taken and the leg lengths were measured and found to be equal. Hip was then dislocated again and the femoral head and neck removed. The  ?femoral broach was removed. Size 5 Actis stem with a high offset  ?neck was then impacted into the femur following native anteversion. Has  ?excellent purchase in the canal. Excellent torsional and rotational and  ?axial stability. It is confirmed to be in the canal on AP and lateral  ?fluoroscopic views. The 32 + 1 ceramic head was placed and the hip  ?reduced with outstanding stability. Again AP pelvis was taken and it  ?confirmed that the leg lengths were equal. The wound was then copiously  ?irrigated with saline solution and the capsule reattached and repaired  ?with Ethibond suture. 30 ml of .25% Bupivicaine was  injected into the capsule and into the edge of the tensor fascia lata as well as subcutaneous tissue. The fascia overlying the tensor fascia lata was then closed with a running #1 V-Loc. Subcu was closed with interrupted 2-0 Vicryl and subcuticular running 4-0 Monocryl. Incision was cleaned  ?and dried. Steri-Strips and a bulky sterile dressing applied. The patient was awakened and transported to  ?recovery in stable condition.  ?      Please note that a surgical assistant was a medical necessity for this procedure to perform it in a safe and expeditious manner. Assistant was necessary to provide appropriate retraction of vital neurovascular structures and to prevent femoral fracture and allow for anatomic placement of the prosthesis. ? ?Gaynelle Arabian, M.D.  ? ? ?

## 2022-04-27 NOTE — Discharge Instructions (Addendum)
?Kenneth Arabian, MD ?Total Joint Specialist ?[facility] ?[address] ?[place], [place] [address] ?[phone] ? ?ANTERIOR APPROACH TOTAL HIP REPLACEMENT POSTOPERATIVE DIRECTIONS ? ? ? ? ?Hip Rehabilitation, Guidelines Following Surgery  ?The results of a hip operation are greatly improved after range of motion and muscle strengthening exercises. Follow all safety measures which are given to protect your hip. If any of these exercises cause increased pain or swelling in your joint, decrease the amount until you are comfortable again. Then slowly increase the exercises. Call your caregiver if you have problems or questions.  ? ?BLOOD CLOT PREVENTION ?Take a 10 mg Xarelto once a day for three weeks following surgery. Then resume one 81 mg aspirin once a day. ?You may resume your vitamins/supplements once you have discontinued the Xarelto. ?Do not take any NSAIDs (Advil, Aleve, Ibuprofen, Meloxicam, etc.) until you have discontinued the Xarelto.  ? ? ?HOME CARE INSTRUCTIONS  ?Remove items at home which could result in a fall. This includes throw rugs or furniture in walking pathways.  ?ICE to the affected hip as frequently as 20-30 minutes an hour and then as needed for pain and swelling. Continue to use ice on the hip for pain and swelling from surgery. You may notice swelling that will progress down to the foot and ankle. This is normal after surgery. Elevate the leg when you are not up walking on it.   ?Continue to use the breathing machine which will help keep your temperature down.  It is common for your temperature to cycle up and down following surgery, especially at night when you are not up moving around and exerting yourself.  The breathing machine keeps your lungs expanded and your temperature down. ? ?DIET ?You may resume your previous home diet once your are discharged from the hospital. ? ?DRESSING / WOUND CARE / SHOWERING ?You have an adhesive waterproof bandage over the  incision. Leave this in place until your first follow-up appointment. Once you remove this you will not need to place another bandage.  ?You may begin showering 3 days following surgery, but do not submerge the incision under water. ? ?ACTIVITY ?For the first 3-5 days, it is important to rest and keep the operative leg elevated. You should, as a general rule, rest for 50 minutes and walk/stretch for 10 minutes per hour. After 5 days, you may slowly increase activity as tolerated.  ?Perform the exercises you were provided twice a day for about 15-20 minutes each session. Begin these 2 days following surgery. ?Walk with your walker as instructed. Use the walker until you are comfortable transitioning to a cane. Walk with the cane in the opposite hand of the operative leg. You may discontinue the cane once you are comfortable and walking steadily. ?Avoid periods of inactivity such as sitting longer than an hour when not asleep. This helps prevent blood clots.  ?Do not drive a car for 6 weeks or until released by your surgeon.  ?Do not drive while taking narcotics. ? ?TED HOSE STOCKINGS ?Wear the elastic stockings on both legs for three weeks following surgery during the day. You may remove them at night while sleeping. ? ?WEIGHT BEARING ?Weight bearing as tolerated with assist device (walker, cane, etc) as directed, use it as long as suggested by your surgeon or therapist, typically at least 4-6 weeks. ? ?POSTOPERATIVE CONSTIPATION PROTOCOL ?Constipation - defined medically as fewer than three stools per week and severe constipation as less than one stool per week. ? ?  One of the most common issues patients have following surgery is constipation.  Even if you have a regular bowel pattern at home, your normal regimen is likely to be disrupted due to multiple reasons following surgery.  Combination of anesthesia, postoperative narcotics, change in appetite and fluid intake all can affect your bowels.  In order to avoid  complications following surgery, here are some recommendations in order to help you during your recovery period. ? ?Colace (docusate) - Pick up an over-the-counter form of Colace or another stool softener and take twice a day as long as you are requiring postoperative pain medications.  Take with a full glass of water daily.  If you experience loose stools or diarrhea, hold the colace until you stool forms back up.  If your symptoms do not get better within 1 week or if they get worse, check with your doctor. ?Dulcolax (bisacodyl) - Pick up over-the-counter and take as directed by the product packaging as needed to assist with the movement of your bowels.  Take with a full glass of water.  Use this product as needed if not relieved by Colace only.  ?MiraLax (polyethylene glycol) - Pick up over-the-counter to have on hand.  MiraLax is a solution that will increase the amount of water in your bowels to assist with bowel movements.  Take as directed and can mix with a glass of water, juice, soda, coffee, or tea.  Take if you go more than two days without a movement.Do not use MiraLax more than once per day. Call your doctor if you are still constipated or irregular after using this medication for 7 days in a row. ? ?If you continue to have problems with postoperative constipation, please contact the office for further assistance and recommendations.  If you experience "the worst abdominal pain ever" or develop nausea or vomiting, please contact the office immediatly for further recommendations for treatment. ? ?ITCHING ? If you experience itching with your medications, try taking only a single pain pill, or even half a pain pill at a time.  You can also use Benadryl over the counter for itching or also to help with sleep.  ? ?MEDICATIONS ?See your medication summary on the ?After Visit Summary? that the nursing staff will review with you prior to discharge.  You may have some home medications which will be placed on  hold until you complete the course of blood thinner medication.  It is important for you to complete the blood thinner medication as prescribed by your surgeon.  Continue your approved medications as instructed at time of discharge. ? ?PRECAUTIONS ?If you experience chest pain or shortness of breath - call 911 immediately for transfer to the hospital emergency department.  ?If you develop a fever greater that 101 F, purulent drainage from wound, increased redness or drainage from wound, foul odor from the wound/dressing, or calf pain - CONTACT YOUR SURGEON.   ?                                                ?FOLLOW-UP APPOINTMENTS ?Make sure you keep all of your appointments after your operation with your surgeon and caregivers. You should call the office at the above phone number and make an appointment for approximately two weeks after the date of your surgery or on the date instructed by your surgeon outlined in  the "After Visit Summary". ? ?RANGE OF MOTION AND STRENGTHENING EXERCISES  ?These exercises are designed to help you keep full movement of your hip joint. Follow your caregiver's or physical therapist's instructions. Perform all exercises about fifteen times, three times per day or as directed. Exercise both hips, even if you have had only one joint replacement. These exercises can be done on a training (exercise) mat, on the floor, on a table or on a bed. Use whatever works the best and is most comfortable for you. Use music or television while you are exercising so that the exercises are a pleasant break in your day. This will make your life better with the exercises acting as a break in routine you can look forward to.  ?Lying on your back, slowly slide your foot toward your buttocks, raising your knee up off the floor. Then slowly slide your foot back down until your leg is straight again.  ?Lying on your back spread your legs as far apart as you can without causing discomfort.  ?Lying on your side,  raise your upper leg and foot straight up from the floor as far as is comfortable. Slowly lower the leg and repeat.  ?Lying on your back, tighten up the muscle in the front of your thigh (quadriceps muscles).

## 2022-04-27 NOTE — Anesthesia Procedure Notes (Signed)
Procedure Name: Intubation ?Date/Time: 04/27/2022 8:21 AM ?Performed by: Aline Brochure, CRNA ?Pre-anesthesia Checklist: Patient identified, Patient being monitored, Timeout performed, Emergency Drugs available and Suction available ?Patient Re-evaluated:Patient Re-evaluated prior to induction ?Oxygen Delivery Method: Circle system utilized ?Preoxygenation: Pre-oxygenation with 100% oxygen ?Induction Type: IV induction ?Ventilation: Mask ventilation without difficulty ?Laryngoscope Size: Mac and 4 ?Grade View: Grade II ?Tube type: Oral ?Tube size: 7.5 mm ?Number of attempts: 1 ?Airway Equipment and Method: Stylet ?Placement Confirmation: ETT inserted through vocal cords under direct vision, positive ETCO2 and breath sounds checked- equal and bilateral ?Secured at: 23 cm ?Tube secured with: Tape ?Dental Injury: Teeth and Oropharynx as per pre-operative assessment  ? ? ? ? ?

## 2022-04-27 NOTE — Anesthesia Postprocedure Evaluation (Signed)
Anesthesia Post Note ? ?Patient: Darion Milewski II ? ?Procedure(s) Performed: TOTAL HIP ARTHROPLASTY ANTERIOR APPROACH (Right: Hip) ? ?  ? ?Patient location during evaluation: PACU ?Anesthesia Type: General ?Level of consciousness: awake and alert ?Pain management: pain level controlled ?Vital Signs Assessment: post-procedure vital signs reviewed and stable ?Respiratory status: spontaneous breathing, nonlabored ventilation, respiratory function stable and patient connected to nasal cannula oxygen ?Cardiovascular status: blood pressure returned to baseline and stable ?Postop Assessment: no apparent nausea or vomiting ?Anesthetic complications: no ? ? ?No notable events documented. ? ?Last Vitals:  ?Vitals:  ? 04/27/22 1030 04/27/22 1045  ?BP: (!) 148/91 125/89  ?Pulse:    ?Resp:    ?Temp:    ?SpO2:    ?  ?Last Pain:  ?Vitals:  ? 04/27/22 1045  ?TempSrc:   ?PainSc: 10-Worst pain ever  ? ? ?  ?  ?  ?  ?  ?  ? ?Ashle Stief ? ? ? ? ?

## 2022-04-27 NOTE — Evaluation (Signed)
Physical Therapy Evaluation ?Patient Details ?Name: Kenneth [name] ?MRN: [id number] ?DOB: Jun 03, 1966 ?Today's Date: 04/27/2022 ? ?History of Present Illness ? Pt is a 56yo male presenting s/p R-THA, AA on 04/27/22. PMH: CAD, depression, HLD, HTN, CABG 2015, L-THA 2017  ?Clinical Impression ? Kenneth [name] is a 56 y.o. male POD 0 s/p R-THA, AA. Upon entry, pt in recliner and reports he transferred himself without staff present; reinforced importance of mobilizing with staff present for safety, pt and wife Mickel Baas verbalized understanding, RN aware. Patient reports independence with mobility at baseline. Patient is now limited by functional impairments (see PT problem list below) and requires min guard for transfers and gait with RW. Patient was able to ambulate 60 feet with RW and min guard assist. Patient instructed in exercise to facilitate ROM and circulation to manage edema as well as incentive spirometry. Patient will benefit from continued skilled PT interventions to address impairments and progress towards PLOF. Acute PT will follow to progress mobility and stair training in preparation for safe discharge home.   ?   ? ?Recommendations for follow up therapy are one component of a multi-disciplinary discharge planning process, led by the attending physician.  Recommendations may be updated based on patient status, additional functional criteria and insurance authorization. ? ?Follow Up Recommendations Follow physician's recommendations for discharge plan and follow up therapies ? ?  ?Assistance Recommended at Discharge Set up Supervision/Assistance  ?Patient can return home with the following ? A little help with walking and/or transfers;A little help with bathing/dressing/bathroom;Assistance with cooking/housework;Assist for transportation;Help with stairs or ramp for entrance ? ?  ?Equipment Recommendations Rolling walker (2 wheels)  ?Recommendations for Other Services ?    ?  ?Functional  Status Assessment Patient has had a recent decline in their functional status and demonstrates the ability to make significant improvements in function in a reasonable and predictable amount of time.  ? ?  ?Precautions / Restrictions Precautions ?Precautions: None ?Restrictions ?Weight Bearing Restrictions: No ?Other Position/Activity Restrictions: WBAT  ? ?  ? ?Mobility ? Bed Mobility ?Overal bed mobility: Independent ?  ?  ?  ?  ?  ?  ?General bed mobility comments: Pt had gotten himself out of bed and to the recliner prior to session; emphasized importance of mobilizing with staff present for safety and reduced fall risk. Pt and wife verbalized understanding. ?  ? ?Transfers ?Overall transfer level: Needs assistance ?Equipment used: Rolling walker (2 wheels) ?Transfers: Sit to/from Stand ?Sit to Stand: Supervision ?  ?  ?  ?  ?  ?General transfer comment: Pt supervision, no  physical assist or cuing required. ?  ? ?Ambulation/Gait ?Ambulation/Gait assistance: Min guard, +2 safety/equipment ?Gait Distance (Feet): 60 Feet ?Assistive device: Rolling walker (2 wheels) ?Gait Pattern/deviations: WFL(Within Functional Limits) ?Gait velocity: decreased ?  ?  ?General Gait Details: Pt ambulated 72f with RW and min guard assist, +2 recliner follow for safety only. No overt LOB. ? ?Stairs ?  ?  ?  ?  ?  ? ?Wheelchair Mobility ?  ? ?Modified Rankin (Stroke Patients Only) ?  ? ?  ? ?Balance Overall balance assessment: Needs assistance ?Sitting-balance support: Feet supported, No upper extremity supported ?Sitting balance-Leahy Scale: Good ?  ?  ?Standing balance support: Bilateral upper extremity supported, During functional activity, Reliant on assistive device for balance ?Standing balance-Leahy Scale: Poor ?  ?  ?  ?  ?  ?  ?  ?  ?  ?  ?  ?  ?   ? ? ? ?  Pertinent Vitals/Pain Pain Assessment ?Pain Assessment: 0-10 ?Pain Score: 3  ?Pain Location: hip ?Pain Descriptors / Indicators: Operative site guarding, Discomfort ?Pain  Intervention(s): Limited activity within patient's tolerance, Monitored during session, Repositioned  ? ? ?Home Living Family/patient expects to be discharged to:: Private residence ?Living Arrangements: Spouse/significant other ?Available Help at Discharge: Family;Available 24 hours/day ?Type of Home: House ?Home Access: Stairs to enter ?Entrance Stairs-Rails: Right;Left;Can reach both ?Entrance Stairs-Number of Steps: 3 ?Alternate Level Stairs-Number of Steps: 12 ?Home Layout: Two level;Bed/bath upstairs ?Home Equipment: Kasandra Knudsen - single point ?   ?  ?Prior Function Prior Level of Function : Independent/Modified Independent ?  ?  ?  ?  ?  ?  ?Mobility Comments: ind ?ADLs Comments: ind ?  ? ? ?Hand Dominance  ? Dominant Hand: Left ? ?  ?Extremity/Trunk Assessment  ? Upper Extremity Assessment ?Upper Extremity Assessment: Overall WFL for tasks assessed ?  ? ?Lower Extremity Assessment ?Lower Extremity Assessment: RLE deficits/detail;LLE deficits/detail ?RLE Deficits / Details: MMT ank df/pf 5/5 ?RLE Sensation: WNL ?LLE Deficits / Details: MMT ank df/pf 5/5 ?LLE Sensation: WNL ?  ? ?Cervical / Trunk Assessment ?Cervical / Trunk Assessment: Normal  ?Communication  ? Communication: No difficulties  ?Cognition Arousal/Alertness: Awake/alert ?Behavior During Therapy: [facility] for tasks assessed/performed ?Overall Cognitive Status: Within Functional Limits for tasks assessed ?  ?  ?  ?  ?  ?  ?  ?  ?  ?  ?  ?  ?  ?  ?  ?  ?  ?  ?  ? ?  ?General Comments   ? ?  ?Exercises Total Joint Exercises ?Ankle Circles/Pumps: AROM, Both, 20 reps ?Other Exercises ?Other Exercises: incentive spirometry, x5, VC for technique  ? ?Assessment/Plan  ?  ?PT Assessment Patient needs continued PT services  ?PT Problem List Decreased strength;Decreased activity tolerance;Decreased range of motion;Decreased balance;Decreased mobility;Decreased coordination;Decreased safety awareness;Pain ? ?   ?  ?PT Treatment Interventions DME instruction;Gait  training;Stair training;Functional mobility training;Therapeutic activities;Therapeutic exercise;Balance training;Neuromuscular re-education;Patient/family education   ? ?PT Goals (Current goals can be found in the Care Plan section)  ?Acute Rehab PT Goals ?Patient Stated Goal: Attend graduation at app state on 5/12 ?PT Goal Formulation: With patient ?Time For Goal Achievement: 05/04/22 ?Potential to Achieve Goals: Good ? ?  ?Frequency 7X/week ?  ? ? ?Co-evaluation   ?  ?  ?  ?  ? ? ?  ?AM-PAC PT "6 Clicks" Mobility  ?Outcome Measure Help needed turning from your back to your side while in a flat bed without using bedrails?: None ?Help needed moving from lying on your back to sitting on the side of a flat bed without using bedrails?: None ?Help needed moving to and from a bed to a chair (including a wheelchair)?: A Little ?Help needed standing up from a chair using your arms (e.g., wheelchair or bedside chair)?: A Little ?Help needed to walk in hospital room?: A Little ?Help needed climbing 3-5 steps with a railing? : A Little ?6 Click Score: 20 ? ?  ?End of Session Equipment Utilized During Treatment: Gait belt ?Activity Tolerance: Patient tolerated treatment well;No increased pain ?Patient left: in chair;with call bell/phone within reach;with chair alarm set;with family/visitor present ?Nurse Communication: Mobility status ?PT Visit Diagnosis: Difficulty in walking, not elsewhere classified (R26.2) ?  ? ?Time: 9678-9381 ?PT Time Calculation (min) (ACUTE ONLY): 25 min ? ? ?Charges:   PT Evaluation ?$PT Eval Low Complexity: 1 Low ?PT Treatments ?$Gait Training: 8-22 mins ?  ?   ? ?  Coolidge Breeze, PT, DPT ?[facility] Department ?Office: [phone] ?Pager: [phone] ? ?Coolidge Breeze ?04/27/2022, 3:23 PM ? ?

## 2022-04-28 ENCOUNTER — Encounter (HOSPITAL_COMMUNITY): Payer: Self-pay | Admitting: Orthopedic Surgery

## 2022-04-28 DIAGNOSIS — M1611 Unilateral primary osteoarthritis, right hip: Secondary | ICD-10-CM | POA: Diagnosis not present

## 2022-04-28 LAB — BASIC METABOLIC PANEL
Anion gap: 4 — ABNORMAL LOW (ref 5–15)
BUN: 25 mg/dL — ABNORMAL HIGH (ref 6–20)
CO2: 26 mmol/L (ref 22–32)
Calcium: 8.2 mg/dL — ABNORMAL LOW (ref 8.9–10.3)
Chloride: 107 mmol/L (ref 98–111)
Creatinine, Ser: 1.23 mg/dL (ref 0.61–1.24)
GFR, Estimated: >60 mL/min (ref >60)
Glucose, Bld: 178 mg/dL — ABNORMAL HIGH (ref 70–99)
Potassium: 4.3 mmol/L (ref 3.5–5.1)
Sodium: 137 mmol/L (ref 135–145)

## 2022-04-28 LAB — CBC
HCT: 39.4 % (ref 39.0–52.0)
Hemoglobin: 13.5 g/dL (ref 13.0–17.0)
MCH: 30.5 pg (ref 26.0–34.0)
MCHC: 34.3 g/dL (ref 30.0–36.0)
MCV: 88.9 fL (ref 80.0–100.0)
Platelets: 199 10*3/uL (ref 150–400)
RBC: 4.43 MIL/uL (ref 4.22–5.81)
RDW: 13 % (ref 11.5–15.5)
WBC: 15.2 10*3/uL — ABNORMAL HIGH (ref 4.0–10.5)
nRBC: 0 % (ref 0.0–0.2)

## 2022-04-28 MED ORDER — CYCLOBENZAPRINE HCL 10 MG PO TABS
10.0000 mg | ORAL_TABLET | Freq: Three times a day (TID) | ORAL | 0 refills | Status: AC | PRN
Start: 2022-04-28 — End: ?

## 2022-04-28 MED ORDER — HYDROCODONE-ACETAMINOPHEN 5-325 MG PO TABS: 1.0000 | ORAL_TABLET | Freq: Four times a day (QID) | ORAL | 0 refills | Status: DC | PRN

## 2022-04-28 MED ORDER — RIVAROXABAN 10 MG PO TABS
10.0000 mg | ORAL_TABLET | Freq: Every day | ORAL | 0 refills | Status: AC
Start: 2022-04-28 — End: 2022-05-18

## 2022-04-28 NOTE — Plan of Care (Signed)
Discharge instructions given to the Patient including medications.  ?

## 2022-04-28 NOTE — Progress Notes (Signed)
? ?Subjective: ?1 Day Post-Op Procedure(s) (LRB): ?TOTAL HIP ARTHROPLASTY ANTERIOR APPROACH (Right) ?Patient reports pain as mild.   ?Patient seen in rounds by Dr. Wynelle Link. ?Patient is well, and has had no acute complaints or problems. Denies chest pain or SOB. No issues overnight. Voiding without difficulty, states he is ready to go home. ?We will continue therapy today, ambulated 32' yesterday.  ? ?Objective: ?Vital signs in last 24 hours: ?Temp:  [97.5 ?F (36.4 ?C)-98.5 ?F (36.9 ?C)] 98 ?F (36.7 ?C) (05/04 0526) ?Pulse Rate:  [84-105] 98 (05/04 0526) ?Resp:  [13-23] 17 (05/04 0526) ?BP: (101-149)/(64-92) 114/80 (05/04 0526) ?SpO2:  [90 %-99 %] 93 % (05/04 0526) ? ?Intake/Output from previous day: ? ?Intake/Output Summary (Last 24 hours) at 04/28/2022 0749 ?Last data filed at 04/28/2022 0600 ?Gross per 24 hour  ?Intake 3409.43 ml  ?Output 1250 ml  ?Net 2159.43 ml  ?  ? ?Intake/Output this shift: ?No intake/output data recorded. ? ?Labs: ?Recent Labs  ?  04/28/22 ?0333  ?HGB 13.5  ? ?Recent Labs  ?  04/28/22 ?0333  ?WBC 15.2*  ?RBC 4.43  ?HCT 39.4  ?PLT 199  ? ?Recent Labs  ?  04/28/22 ?0333  ?NA 137  ?K 4.3  ?CL 107  ?CO2 26  ?BUN 25*  ?CREATININE 1.23  ?GLUCOSE 178*  ?CALCIUM 8.2*  ? ?No results for input(s): LABPT, INR in the last 72 hours. ? ?Exam: ?General - Patient is Alert and Oriented ?Extremity - Neurologically intact ?Neurovascular intact ?Sensation intact distally ?Dorsiflexion/Plantar flexion intact ?Dressing - dressing C/D/I ?Motor Function - intact, moving foot and toes well on exam.  ? ?Past Medical History:  ?Diagnosis Date  ? Arthritis   ? CAD (coronary artery disease)   ? a. 02/2014 Abnl MV with defect in LCX territory;  b. 02/2014 Cath: LAD 60-70p, 43m LCX 100 CTO, OM1 95p, 99d, RCA 100 CTO w/ L->R collaterals; c. 02/2014 CABG x 4 (LIMA->LAD, VG->OM, VG->RPDA->RPL); d. 01/2016 MV: 41%, med size, sev intensity fixed basal inferior lateral defect w/o ischemia.  ? CAD (coronary artery disease)   ? Car[place]  arterial disease [place]   ? a. 06/2013 Carotid U/S: moderate narrowing of both subclavian arteries, with normal carotid arteries;  b. 02/2014 Carotid U/S: 1-39% bilat ICA stenosis.  ? Depression   ? Fracture   ? nasal bone  ? History of echocardiogram   ? a. 02/2016 Echo: EF 50-55%, no rwma, mildly dil LA, nl RV fxn, mildly dil RA.  ? History of kidney stones   ? Hyperlipidemia   ? Hypertension   ? Hypertensive heart disease   ? PONV (postoperative nausea and vomiting)   ? Syndrome X, cardiac ([place]   ? a. Previously felt to have microvascular angina following remote cath in [place]-->treated with nitrates, ranexa, EECP.  Later found to have multivessel CAD.  ? Wears glasses   ? ? ?Assessment/Plan: ?1 Day Post-Op Procedure(s) (LRB): ?TOTAL HIP ARTHROPLASTY ANTERIOR APPROACH (Right) ?Principal Problem: ?  Primary osteoarthritis of right hip ? ?Estimated body mass index is 32.82 kg/m? as calculated from the following: ?  Height as of this encounter: '5\' 9"'$  (1.753 m). ?  Weight as of this encounter: 100.8 kg. ?Advance diet ?Up with therapy ?D/C IV fluids ? ?DVT Prophylaxis - Xarelto ?Weight bearing as tolerated. ?Continue therapy. ? ?Plan is to go Home after hospital stay. ?Plan for discharge with HEP this AM if progresses with therapy and meeting goals. ?Follow-up in the office in 2 weeks. ? ?The PDMP  database was reviewed today prior to any opioid medications being prescribed to this patient. ? ?Theresa Duty, PA-C ?Orthopedic Surgery ?[phone] ?04/28/2022, 7:49 AM  ?

## 2022-04-28 NOTE — Plan of Care (Signed)
?  Problem: Health Behavior/Discharge Planning: ?Goal: Ability to manage health-related needs will improve ?Outcome: Progressing ?  ?Problem: Activity: ?Goal: Risk for activity intolerance will decrease ?Outcome: Progressing ?  ?Problem: Coping: ?Goal: Level of anxiety will decrease ?Outcome: Progressing ?  ?Problem: Safety: ?Goal: Ability to remain free from injury will improve ?Outcome: Progressing ?  ?Problem: Pain Managment: ?Goal: General experience of comfort will improve ?Outcome: Progressing ?  ?

## 2022-04-28 NOTE — Progress Notes (Signed)
Physical Therapy Treatment ?Patient Details ?Name: Kenneth [name] ?MRN: [id number] ?DOB: October 17, 1966 ?Today's Date: 04/28/2022 ? ? ?History of Present Illness Pt is a 56yo male presenting s/p R-THA, AA on 04/27/22. PMH: CAD, depression, HLD, HTN, CABG 2015, L-THA 2017 ? ?  ?PT Comments  ? ? Pt seen this am after receiving pain meds, R hip pain 4/10 with mobility. Pt is at an Independent/Supervision level for bed mobility, transfers, gait with RW, and stair negotiation with single rail. Reviewed HEP, pt with good understanding. RW for d/c home in room, pt is functionally ready to return home with intermittent supervision.  ?  ?Recommendations for follow up therapy are one component of a multi-disciplinary discharge planning process, led by the attending physician.  Recommendations may be updated based on patient status, additional functional criteria and insurance authorization. ? ?Follow Up Recommendations ? Follow physician's recommendations for discharge plan and follow up therapies ?  ?  ?Assistance Recommended at Discharge Set up Supervision/Assistance  ?Patient can return home with the following A little help with walking and/or transfers;A little help with bathing/dressing/bathroom;Assistance with cooking/housework;Assist for transportation;Help with stairs or ramp for entrance ?  ?Equipment Recommendations ? Rolling walker (2 wheels)  ?  ?Recommendations for Other Services   ? ? ?  ?Precautions / Restrictions Precautions ?Precautions: None ?Restrictions ?Weight Bearing Restrictions: No  ?  ? ?Mobility ? Bed Mobility ?Overal bed mobility: Independent ?  ?  ?  ?  ?  ?  ?  ?  ? ?Transfers ?Overall transfer level: Needs assistance ?Equipment used: Rolling walker (2 wheels) ?Transfers: Sit to/from Stand ?Sit to Stand: Supervision ?  ?  ?  ?  ?  ?General transfer comment: Pt supervision, no  physical assist or cuing required. ?  ? ?Ambulation/Gait ?Ambulation/Gait assistance: Supervision ?Gait Distance (Feet):  100 Feet ?Assistive device: Rolling walker (2 wheels) ?Gait Pattern/deviations: WFL(Within Functional Limits), Step-through pattern ?  ?  ?  ?  ? ? ?Stairs ?Stairs: Yes ?Stairs assistance: Supervision ?Stair Management: One rail Right, Step to pattern ?Number of Stairs: 12 ?General stair comments: Good demonstraion of safety awareness and technique ? ? ?Wheelchair Mobility ?  ? ?Modified Rankin (Stroke Patients Only) ?  ? ? ?  ?Balance   ?  ?  ?  ?  ?  ?  ?  ?  ?  ?  ?  ?  ?  ?  ?  ?  ?  ?  ?  ? ?  ?Cognition Arousal/Alertness: Awake/alert ?Behavior During Therapy: [facility] for tasks assessed/performed ?Overall Cognitive Status: Within Functional Limits for tasks assessed ?  ?  ?  ?  ?  ?  ?  ?  ?  ?  ?  ?  ?  ?  ?  ?  ?  ?  ?  ? ?  ?Exercises   ? ?  ?General Comments   ?  ?  ? ?Pertinent Vitals/Pain Pain Assessment ?Pain Assessment: 0-10 ?Pain Score: 4  ?Pain Location: R Hip ?Pain Descriptors / Indicators: Operative site guarding, Discomfort ?Pain Intervention(s): Monitored during session, Premedicated before session  ? ? ?Home Living   ?  ?  ?  ?  ?  ?  ?  ?  ?  ?   ?  ?Prior Function    ?  ?  ?   ? ?PT Goals (current goals can now be found in the care plan section) Acute Rehab PT Goals ?Patient Stated Goal: Attend graduation at  app state on 5/12 ?Progress towards PT goals: Progressing toward goals ? ?  ?Frequency ? ? ? 7X/week ? ? ? ?  ?PT Plan Current plan remains appropriate  ? ? ?Co-evaluation   ?  ?  ?  ?  ? ?  ?AM-PAC PT "6 Clicks" Mobility   ?Outcome Measure ? Help needed turning from your back to your side while in a flat bed without using bedrails?: None ?Help needed moving from lying on your back to sitting on the side of a flat bed without using bedrails?: None ?Help needed moving to and from a bed to a chair (including a wheelchair)?: A Little ?Help needed standing up from a chair using your arms (e.g., wheelchair or bedside chair)?: A Little ?Help needed to walk in hospital room?: A Little ?Help needed  climbing 3-5 steps with a railing? : A Little ?6 Click Score: 20 ? ?  ?End of Session Equipment Utilized During Treatment: Gait belt ?Activity Tolerance: Patient tolerated treatment well;No increased pain ?Patient left: in chair;with call bell/phone within reach;with chair alarm set;with family/visitor present ?Nurse Communication: Mobility status ?PT Visit Diagnosis: Difficulty in walking, not elsewhere classified (R26.2) ?  ? ? ?Time: 2707-8675 ?PT Time Calculation (min) (ACUTE ONLY): 31 min ? ?Charges:  $Gait Training: 8-22 mins ?$Therapeutic Exercise: 8-22 mins          ?          ?Mikel Cella, PTA ? ? ? ?Josie Dixon ?04/28/2022, 11:52 AM ? ?

## 2022-04-28 NOTE — TOC Transition Note (Signed)
Transition of Care (TOC) - CM/SW Discharge Note ? ?Patient Details  ?Name: Kenneth [name] ?MRN: [id number] ?Date of Birth: [birth year] ? ?Transition of Care (TOC) CM/SW Contact:  ?Sherie Don, LCSW ?Phone Number: ?04/28/2022, 11:04 AM ? ?Clinical Narrative: Patient is expected to discharge home after working with PT. CSW met with patient to review discharge plan and needs. Patient will go home with a home exercise program (HEP). Patient will need a rolling walker, which was delivered to patient's room by [employer]. TOC signing off. ? ?Final next level of care: Home/Self Care ?Barriers to Discharge: No Barriers Identified ? ?Patient Goals and CMS Choice ?Patient states their goals for this hospitalization and ongoing recovery are:: Discharge home with HEP ?CMS Medicare.gov Compare Post Acute Care list provided to:: Patient ?Choice offered to / list presented to : Patient ? ?Discharge Plan and Services        ?DME Arranged: Walker rolling ?DME Agency: [employer] ?Representative spoke with at DME Agency: Prearranged in orthopedist's office ? ?Readmission Risk Interventions ?   ? View : No data to display.  ?  ?  ?  ? ?

## 2022-05-02 NOTE — Discharge Summary (Signed)
Patient ID: ?Kenneth [name] ?MRN: [id number] ?DOB/AGE: 02/27/1966 56 y.o. ? ?Admit date: 04/27/2022 ?Discharge date: 04/28/2022 ? ?Admission Diagnoses:  ?Principal Problem: ?  Primary osteoarthritis of right hip ? ? ?Discharge Diagnoses:  ?Same ? ?Past Medical History:  ?Diagnosis Date  ? Arthritis   ? CAD (coronary artery disease)   ? a. 02/2014 Abnl MV with defect in LCX territory;  b. 02/2014 Cath: LAD 60-70p, 66m LCX 100 CTO, OM1 95p, 99d, RCA 100 CTO w/ L->R collaterals; c. 02/2014 CABG x 4 (LIMA->LAD, VG->OM, VG->RPDA->RPL); d. 01/2016 MV: 41%, med size, sev intensity fixed basal inferior lateral defect w/o ischemia.  ? CAD (coronary artery disease)   ? Carotid arterial disease ([place]   ? a. 06/2013 Carotid U/S: moderate narrowing of both subclavian arteries, with normal carotid arteries;  b. 02/2014 Carotid U/S: 1-39% bilat ICA stenosis.  ? Depression   ? Fracture   ? nasal bone  ? History of echocardiogram   ? a. 02/2016 Echo: EF 50-55%, no rwma, mildly dil LA, nl RV fxn, mildly dil RA.  ? History of kidney stones   ? Hyperlipidemia   ? Hypertension   ? Hypertensive heart disease   ? PONV (postoperative nausea and vomiting)   ? Syndrome X, cardiac ([place]   ? a. Previously felt to have microvascular angina following remote cath in [place]-->treated with nitrates, ranexa, EECP.  Later found to have multivessel CAD.  ? Wears glasses   ? ? ?Surgeries: Procedure(s): ?TOTAL HIP ARTHROPLASTY ANTERIOR APPROACH on 04/27/2022 ?  ?Consultants:  ? ?Discharged Condition: Improved ? ?Hospital Course: Kenneth [name] is an 56y.o. male who was admitted 04/27/2022 for operative treatment ofPrimary osteoarthritis of right hip. Patient has severe unremitting pain that affects sleep, daily activities, and work/hobbies. After pre-op clearance the patient was taken to the operating room on 04/27/2022 and underwent  Procedure(s): ?TOTAL HIP ARTHROPLASTY ANTERIOR APPROACH.   ? ?Patient was given perioperative antibiotics:   ?Anti-infectives (From admission, onward)  ? ? Start     Dose/Rate Route Frequency Ordered Stop  ? 04/27/22 1400  ceFAZolin (ANCEF) IVPB 2g/100 mL premix       ? 2 g ?200 mL/hr over 30 Minutes Intravenous Every 6 hours 04/27/22 1008 04/28/22 1247  ? 04/27/22 0700  ceFAZolin (ANCEF) IVPB 2g/100 mL premix       ? 2 g ?200 mL/hr over 30 Minutes Intravenous On call to O.R. 04/27/22 0646805/03/23 0835  ? ?  ?  ? ?Patient was given sequential compression devices, early ambulation, and chemoprophylaxis to prevent DVT. ? ?Patient benefited maximally from hospital stay and there were no complications.   ? ?Recent vital signs: No data found.  ? ?Recent laboratory studies: No results for input(s): WBC, HGB, HCT, PLT, NA, K, CL, CO2, BUN, CREATININE, GLUCOSE, INR, CALCIUM in the last 72 hours. ? ?Invalid input(s): PT, 2 ? ? ?Discharge Medications:   ?Allergies as of 04/28/2022   ? ?   Reactions  ? Tetracyclines & Related Nausea And Vomiting  ? ?  ? ?  ?Medication List  ?  ? ?STOP taking these medications   ? ?aspirin EC 81 MG tablet ?  ?cyanocobalamin 1000 MCG/ML injection ?Commonly known as: (VITAMIN B-12) ?  ?DHEA PO ?  ?meloxicam 15 MG tablet ?Commonly known as: MOBIC ?  ?zolpidem 10 MG tablet ?Commonly known as: AMBIEN ?  ? ?  ? ?TAKE these medications   ? ?amphetamine-dextroamphetamine 30 MG 24 hr capsule ?Commonly known  as: ADDERALL XR ?Take 30 mg by mouth daily. ?  ?atorvastatin 80 MG tablet ?Commonly known as: LIPITOR ?Take 1 tablet (80 mg total) by mouth at bedtime. ?  ?cyclobenzaprine 10 MG tablet ?Commonly known as: FLEXERIL ?Take 1 tablet (10 mg total) by mouth 3 (three) times daily as needed for muscle spasms. ?What changed: when to take this ?  ?DULoxetine 60 MG capsule ?Commonly known as: CYMBALTA ?Take 60 mg by mouth daily. ?  ?Entresto 97-103 MG ?Generic drug: sacubitril-valsartan ?Take 1 tablet by mouth 2 (two) times daily. ?  ?ezetimibe 10 MG tablet ?Commonly known as: ZETIA ?Take 1 tablet (10 mg total) by  mouth daily. ?  ?HYDROcodone-acetaminophen 5-325 MG tablet ?Commonly known as: NORCO/VICODIN ?Take 1-2 tablets by mouth every 6 (six) hours as needed for severe pain. ?  ?Repatha SureClick 629 MG/ML Soaj ?Generic drug: Evolocumab ?USE AS DIRECTED ?  ?rivaroxaban 10 MG Tabs tablet ?Commonly known as: XARELTO ?Take 1 tablet (10 mg total) by mouth daily with breakfast for 20 days. Then resume one 81 mg aspirin once a day. ?  ?sildenafil 50 MG tablet ?Commonly known as: VIAGRA ?TAKE 1 TABLET BY MOUTH DAILY AS NEEDED FOR ERECTILE DYSFUNCTION ?  ?testosterone cypionate 200 MG/ML injection ?Commonly known as: DEPOTESTOSTERONE CYPIONATE ?Inject 300 mg into the muscle every Wednesday. ?  ?traMADol 50 MG tablet ?Commonly known as: ULTRAM ?Take 50 mg by mouth every 6 (six) hours as needed for moderate pain. ?  ? ?  ? ?  ?  ? ? ?  ?Discharge Care Instructions  ?(From admission, onward)  ?  ? ? ?  ? ?  Start     Ordered  ? 04/28/22 0000  Weight bearing as tolerated       ? 04/28/22 0752  ? 04/28/22 0000  Change dressing       ?Comments: You have an adhesive waterproof bandage over the incision. Leave this in place until your first follow-up appointment. Once you remove this you will not need to place another bandage.  ? 04/28/22 0752  ? ?  ?  ? ?  ? ? ?Diagnostic Studies: DG Pelvis Portable ? ?Result Date: 04/27/2022 ?CLINICAL DATA:  Status post right hip replacement. EXAM: PORTABLE PELVIS 1-2 VIEWS COMPARISON:  October 26, 2016. FINDINGS: Status post interval placement of right total hip arthroplasty. The right acetabular and femoral components are well situated. No fracture or dislocation is noted. Previously placed left hip arthroplasty is again noted. IMPRESSION: Interval placement of right total hip arthroplasty. Electronically Signed   By: Marijo Conception M.D.   On: 04/27/2022 10:58  ? ?DG C-Arm 1-60 Min-No Report ? ?Result Date: 04/27/2022 ?Fluoroscopy was utilized by the requesting physician.  No radiographic interpretation.   ? ?DG HIP UNILAT WITH PELVIS 1V RIGHT ? ?Result Date: 04/27/2022 ?CLINICAL DATA:  Hip arthroplasty.  Intraoperative fluoroscopy. EXAM: DG HIP (WITH OR WITHOUT PELVIS) 1V RIGHT COMPARISON:  None Available. FINDINGS: Images were performed intraoperatively without the presence of a radiologist. On initial images, there is already total left hip arthroplasty of 1 side. The patient is undergoing total hip arthroplasty of the other side where there is moderate to severe right femoroacetabular joint space narrowing. Total fluoroscopy images: 4 Total fluoroscopy time: 6 seconds Total dose: Radiation Exposure Index (as provided by the fluoroscopic device): 1.24 mGy air Kerma Please see intraoperative findings for further detail. IMPRESSION: Intraoperative fluoroscopy for total hip arthroplasty. Electronically Signed   By: Viann Fish.D.  On: 04/27/2022 10:19   ? ?Disposition: Discharge disposition: 01-Home or Self Care ? ? ? ? ? ? ?Discharge Instructions   ? ? Call MD / Call 911   Complete by: As directed ?  ? If you experience chest pain or shortness of breath, CALL 911 and be transported to the hospital emergency room.  If you develope a fever above 101 F, pus (white drainage) or increased drainage or redness at the wound, or calf pain, call your surgeon's office.  ? Change dressing   Complete by: As directed ?  ? You have an adhesive waterproof bandage over the incision. Leave this in place until your first follow-up appointment. Once you remove this you will not need to place another bandage.  ? Constipation Prevention   Complete by: As directed ?  ? Drink plenty of fluids.  Prune juice may be helpful.  You may use a stool softener, such as Colace (over the counter) 100 mg twice a day.  Use MiraLax (over the counter) for constipation as needed.  ? Diet - low sodium heart healthy   Complete by: As directed ?  ? Do not sit on low chairs, stoools or toilet seats, as it may be difficult to get up from low surfaces    Complete by: As directed ?  ? Driving restrictions   Complete by: As directed ?  ? No driving for two weeks  ? Post-operative opioid taper instructions:   Complete by: As directed ?  ? POST-OPERATIVE OPIOID TAPE

## 2022-08-12 ENCOUNTER — Encounter: Payer: Self-pay | Admitting: Physician Assistant

## 2022-08-12 ENCOUNTER — Ambulatory Visit: Payer: Medicare Other | Admitting: Physician Assistant

## 2022-08-12 VITALS — BP 164/100 | HR 68 | Ht 69.0 in | Wt 231.0 lb

## 2022-08-12 DIAGNOSIS — I10 Essential (primary) hypertension: Secondary | ICD-10-CM | POA: Diagnosis not present

## 2022-08-12 DIAGNOSIS — E785 Hyperlipidemia, unspecified: Secondary | ICD-10-CM | POA: Diagnosis not present

## 2022-08-12 DIAGNOSIS — I5042 Chronic combined systolic (congestive) and diastolic (congestive) heart failure: Secondary | ICD-10-CM | POA: Diagnosis not present

## 2022-08-12 DIAGNOSIS — Z01810 Encounter for preprocedural cardiovascular examination: Secondary | ICD-10-CM

## 2022-08-12 DIAGNOSIS — I6523 Occlusion and stenosis of bilateral carotid arteries: Secondary | ICD-10-CM

## 2022-08-12 DIAGNOSIS — I2510 Atherosclerotic heart disease of native coronary artery without angina pectoris: Secondary | ICD-10-CM | POA: Diagnosis not present

## 2022-08-12 MED ORDER — AMLODIPINE BESYLATE 5 MG PO TABS: 5.0000 mg | ORAL_TABLET | Freq: Every day | ORAL | 3 refills | Status: DC

## 2022-08-12 NOTE — Progress Notes (Signed)
Cardiology Office Note:    Date:  08/12/2022   ID:  Kenneth [name], DOB Sep 08, 1966, MRN [id number]  PCP:  Curly Rim, MD   [facility] Prov[facility] Cardiologist:  Pixie Casino, MD { Referring MD: Curly Rim, MD   Chief Complaint  Patient presents with   Follow-up   Pre-op Exam    hyperension    History of Present Illness:    Kenneth [name] is a 56 y.o. male with a hx of CAD s/p CABG x4 (LIMA-LAD, SVG-OM, SVG-RPDA-RPL), chronic combined systolic thrombus. Heart catheterization 2015 showed multivessel disease leading to CABG x4 as above.  LVEF at that time was 40%.  He had a low risk Myoview in 2017 and 2019.  Echocardiogram 08/2020 for fatigue showed LVEF 55 to 60%, moderate LVH, grade 1 diastolic dysfunction, and no significant valvular disease.  Outpatient heart monitor 09/2020 for palpitations showed PACs and PVCs, no significant arrhythmias.  He was last seen in clinic by Diona Browner, NP on 02/08/2022 for preoperative cardiac evaluation for upcoming right total hip arthroplasty.  Given his clinical comorbidities, he had an 11% risk of Mace but excellent functional capacity with METS nearly 8.0.  He proceeded to surgery.  He was placed on my schedule for follow-up and review of his blood pressure. He reports elevated BP for nearly one year. He doesn't normally check BP at home but can tell by symptoms when his BP is high  - feels like his surroundings are pulsating, dizziness, and sometimes blurry vision. No headaches, no weakness on one side of the body. Through shared decision making with the patient, I opted to add norvasc to his regimen.   He did well with hip surgery and is scheduled to have shoulder surgery Sept 7th.    Past Medical History:  Diagnosis Date   Arthritis    CAD (coronary artery disease)    a. 02/2014 Abnl MV with defect in LCX territory;  b. 02/2014 Cath: LAD 60-70p, 24m LCX 100 CTO, OM1 95p, 99d, RCA 100 CTO w/ L->R  collaterals; c. 02/2014 CABG x 4 (LIMA->LAD, VG->OM, VG->RPDA->RPL); d. 01/2016 MV: 41%, med size, sev intensity fixed basal inferior lateral defect w/o ischemia.   CAD (coronary artery disease)    Carotid arterial disease ([place]    a. 06/2013 Carotid U/S: moderate narrowing of both subclavian arteries, with normal carotid arteries;  b. 02/2014 Carotid U/S: 1-39% bilat ICA stenosis.   Depression    Fracture    nasal bone   History of echocardiogram    a. 02/2016 Echo: EF 50-55%, no rwma, mildly dil LA, nl RV fxn, mildly dil RA.   History of kidney stones    Hyperlipidemia    Hypertension    Hypertensive heart disease    PONV (postoperative nausea and vomiting)    Syndrome X, cardiac ([place]    a. Previously felt to have microvascular angina following remote cath in [place]-->treated with nitrates, ranexa, EECP.  Later found to have multivessel CAD.   Wears glasses     Past Surgical History:  Procedure Laterality Date   CARDIAC CATHETERIZATION  2005   CLOSED REDUCTION NASAL FRACTURE N/A 03/27/2020   Procedure: CLOSED REDUCTION NASAL BONES;  Surgeon: TIrene Limbo MD;  [facility] [facility]  Service: Plastics;  Laterality: N/A;   COLONOSCOPY     CORONARY ARTERY BYPASS GRAFT N/A 03/18/2014   Procedure: CORONARY ARTERY BYPASS GRAFTING (CABG) x 4 using endoscopically harvested right saphenous vein and left  internal mammary artery;  Surgeon: Gaye Pollack, MD;  Location: [facility];  Service: Open Heart Surgery;  Laterality: N/A;   HAND SURGERY Left    INTRAOPERATIVE TRANSESOPHAGEAL ECHOCARDIOGRAM N/A 03/18/2014   Procedure: INTRAOPERATIVE TRANSESOPHAGEAL ECHOCARDIOGRAM;  Surgeon: Gaye Pollack, MD;  Location: [facility];  Service: Open Heart Surgery;  Laterality: N/A;   KIDNEY STONE SURGERY  age 27   LEFT HEART CATHETERIZATION WITH CORONARY ANGIOGRAM N/A 03/14/2014   Procedure: LEFT HEART CATHETERIZATION WITH CORONARY ANGIOGRAM;  Surgeon: Troy Sine, MD;  Location[facility];  Service:  Cardiovascular;  Laterality: N/A;   SHOULDER ARTHROSCOPY  2001   L shoulder   TOTAL HIP ARTHROPLASTY Left 10/26/2016   Procedure: LEFT TOTAL HIP ARTHROPLASTY ANTERIOR APPROACH;  Surgeon: Gaynelle Arabian, MD;  Location: [facility];  Service: Orthopedics;  Laterality: Left;   TOTAL HIP ARTHROPLASTY Right 04/27/2022   Procedure: TOTAL HIP ARTHROPLASTY ANTERIOR APPROACH;  Surgeon: Gaynelle Arabian, MD;  Location: [facility];  Service: Orthopedics;  Laterality: Right;    Current Medications: Current Meds  Medication Sig   amLODipine (NORVASC) 5 MG tablet Take 1 tablet (5 mg total) by mouth daily.   amphetamine-dextroamphetamine (ADDERALL XR) 30 MG 24 hr capsule Take 30 mg by mouth daily.   aspirin EC 81 MG tablet Take 81 mg by mouth daily. Swallow whole.   atorvastatin (LIPITOR) 80 MG tablet Take 1 tablet (80 mg total) by mouth at bedtime.   cyclobenzaprine (FLEXERIL) 10 MG tablet Take 1 tablet (10 mg total) by mouth 3 (three) times daily as needed for muscle spasms.   DULoxetine (CYMBALTA) 60 MG capsule Take 60 mg by mouth daily.    ezetimibe (ZETIA) 10 MG tablet Take 1 tablet (10 mg total) by mouth daily.   HYDROcodone-acetaminophen (NORCO/VICODIN) 5-325 MG tablet Take 1-2 tablets by mouth every 6 (six) hours as needed for severe pain.   liothyronine (CYTOMEL) 25 MCG tablet    magnesium gluconate '54mg'$ /39m syringe INJECT 1 ML ONCE A WEEK   REPATHA SURECLICK 1867MG/ML SOAJ USE AS DIRECTED   sacubitril-valsartan (ENTRESTO) 97-103 MG Take 1 tablet by mouth 2 (two) times daily.   sildenafil (VIAGRA) 50 MG tablet TAKE 1 TABLET BY MOUTH DAILY AS NEEDED FOR ERECTILE DYSFUNCTION   testosterone cypionate (DEPOTESTOSTERONE CYPIONATE) 200 MG/ML injection Inject 300 mg into the muscle every Wednesday.    traMADol (ULTRAM) 50 MG tablet Take 50 mg by mouth every 6 (six) hours as needed for moderate pain.     Allergies:   Tetracyclines & related   Social History   Socioeconomic History   Marital status: Married     Spouse name: Mary   Number of children: Not on file   Years of education: Not on file   Highest education level: Some college, no degree  Occupational History    Comment: self employed  Tobacco Use   Smoking status: Former    Types: Cigars    Quit date: 06/26/2012    Years since quitting: 10.1   Smokeless tobacco: Never   Tobacco comments:    maybe 2 cigars a year  Vaping Use   Vaping Use: Never used  Substance and Sexual Activity   Alcohol use: Not Currently   Drug use: Not Currently   Sexual activity: Yes    Birth control/protection: None  Other Topics Concern   Not on file  Social History Narrative   Lives with wife   [place]       Social Determinants of  Health   Financial Resource Strain: Not on file  Food Insecurity: Not on file  Transportation Needs: Not on file  Physical Activity: Not on file  Stress: Not on file  Social Connections: Not on file     Family History: The patient's family history includes Cancer in his father; Diabetes in his mother; Heart attack in his father and mother; Stroke in his mother.  ROS:   Please see the history of present illness.     All other systems reviewed and are negative.  EKGs/Labs/Other Studies Reviewed:    The following studies were reviewed today:  Echo 08/2020: 1. Left ventricular ejection fraction, by estimation, is 55 to 60%. The  left ventricle has normal function. The left ventricle has no regional  wall motion abnormalities. There is moderate left ventricular hypertrophy.  Left ventricular diastolic  parameters are consistent with Grade I diastolic dysfunction (impaired  relaxation).   2. Right ventricular systolic function is normal. The right ventricular  size is normal.   3. The mitral valve is normal in structure. Trivial mitral valve  regurgitation. No evidence of mitral stenosis.   4. The aortic valve is normal in structure. Aortic valve regurgitation is  not visualized. No aortic stenosis is  present.   5. The inferior vena cava is normal in size with greater than 50%  respiratory variability, suggesting right atrial pressure of 3 mmHg.  EKG:  EKG is not ordered today.  Recent Labs: 04/28/2022: BUN 25; Creatinine, Ser 1.23; Hemoglobin 13.5; Platelets 199; Potassium 4.3; Sodium 137  Recent Lipid Panel    Component Value Date/Time   CHOL 346 (H) 08/24/2020 0905   CHOL 184 08/18/2015 1104   TRIG 221 (H) 08/24/2020 0905   TRIG 143 08/18/2015 1104   HDL 43 08/24/2020 0905   HDL 28 (L) 08/18/2015 1104   CHOLHDL 8.0 (H) 08/24/2020 0905   CHOLHDL 5.5 03/13/2014 1100   VLDL 22 03/13/2014 1100   LDLCALC 257 (H) 08/24/2020 0905   LDLCALC 127 (H) 08/18/2015 1104     Risk Assessment/Calculations:      HYPERTENSION CONTROL Vitals:   08/12/22 1436 08/12/22 1453  BP: (!) 158/92 (!) 164/100    The patient's blood pressure is elevated above target today.  In order to address the patient's elevated BP: A new medication was prescribed today.            Physical Exam:    VS:  BP (!) 164/100 (BP Location: Left Arm, Patient Position: Sitting, Cuff Size: Normal)   Pulse 68   Ht '5\' 9"'$  (1.753 m)   Wt 231 lb (104.8 kg)   SpO2 98%   BMI 34.11 kg/m     Wt Readings from Last 3 Encounters:  08/12/22 231 lb (104.8 kg)  04/27/22 222 lb 4 oz (100.8 kg)  04/15/22 222 lb 4 oz (100.8 kg)     GEN:  Well nourished, well developed in no acute distress HEENT: Normal NECK: No JVD; No carotid bruits LYMPHATICS: No lymphadenopathy CARDIAC: RRR, no murmurs, rubs, gallops RESPIRATORY:  Clear to auscultation without rales, wheezing or rhonchi  ABDOMEN: Soft, non-tender, non-distended MUSCULOSKELETAL:  No edema; No deformity  SKIN: Warm and dry NEUROLOGIC:  Alert and oriented x 3 PSYCHIATRIC:  Normal affect   ASSESSMENT:    1. Essential hypertension   2. Hyperlipidemia LDL goal <70   3. Coronary artery disease involving native coronary artery of native heart without angina  pectoris   4. Chronic combined systolic and diastolic congestive  heart failure ([place])   5. Bilateral carotid artery stenosis   6. Preoperative cardiovascular examination    PLAN:    In order of problems listed above:  Hypertension Entresto was increased to 97-103 mg twice daily at last visit in February 2023 BP still running in the 150-160s Will add 5 mg amlodipine daily with BP log at home Follow up with pharmD in 2 weeks to review BP and lipid panel I did not do hydralazine due to TID dosing, imdur avoided with Viagra, spironolactone avoided with labile potassium and pending surgery.   CAD s/p CABG times 04/14/2014 Low risk Myoview was in 2017 and 2019 Echocardiogram 2021 with improved EF to 55 to 60%, moderate LVH, G1 DD No angina, no changes Continue ASA   Chronic combined systolic and diastolic heart failure Improved EF on last echocardiogram He did not tolerate beta-blocker in the past due to fatigue and weakness He was euvolemic at last follow-up GDMT includes Entresto   Hyperlipidemia On lipitor, reptha, and zetia No recent lipid panel Will get fasting lipids next week and then review with pharmD   Carotid artery disease Mild bilateral 1 to 39% ICA stenosis   Preoperative evaluation for MACE  Pt has a history of CAD and CHF, EF normalized with revascularization. He can achieve greater than 4.0 METS without angina. He is euvolemic on exam. According to the RCRI, he has a 6.6% risk of MACE. He is acceptable to proceed with surgery without further cardiac workup. May hold ASA if necessary per surgeon.    Follow up in 1 week with labs, 2 weeks with pharmD and BP log.      Medication Adjustments/Labs and Tests Ordered: Current medicines are reviewed at length with the patient today.  Concerns regarding medicines are outlined above.  Orders Placed This Encounter  Procedures   Lipid panel   AMB Referral to [facility] Pharm-D   Meds ordered this encounter   Medications   amLODipine (NORVASC) 5 MG tablet    Sig: Take 1 tablet (5 mg total) by mouth daily.    Dispense:  90 tablet    Refill:  3    Patient Instructions  Medication Instructions:  Amlodipine 5 mg   at bedtime   *If you need a refill on your cardiac medications before your next appointment, please call your pharmacy*   Lab Work:   Fasting Lipid in 1 -2 weeks  If you have labs (blood work) drawn today and your tests are completely normal, you will receive your results only by: [place] (if you have MyChart) OR A paper copy in the mail If you have any lab test that is abnormal or we need to change your treatment, we will call you to review the results.   Testing/Procedures:  Not needed  Follow-Up: At [facility], you and your health needs are our priority.  As part of our continuing mission to provide you with exceptional heart care, we have created designated Provider Care Teams.  These Care Teams include your primary Cardiologist (physician) and Advanced Practice Providers (APPs -  Physician Assistants and Nurse Practitioners) who all work together to provide you with the care you need, when you need it.     Your next appointment:   2 week(s)  The format for your next appointment:   In Person  Provider:    [facility]- Pharmacist  - follow up blood pressure and lipids    Other Instructions    Use blood pressure log -  bring with you to your appointment    Signed, Ledora Bottcher, [place]  08/12/2022 4:27 PM    [place]

## 2022-08-12 NOTE — Patient Instructions (Addendum)
Medication Instructions:  Amlodipine 5 mg   at bedtime   *If you need a refill on your cardiac medications before your next appointment, please call your pharmacy*   Lab Work:   Fasting Lipid in 1 -2 weeks  If you have labs (blood work) drawn today and your tests are completely normal, you will receive your results only by: [place] (if you have MyChart) OR A paper copy in the mail If you have any lab test that is abnormal or we need to change your treatment, we will call you to review the results.   Testing/Procedures:  Not needed  Follow-Up: At [facility], you and your health needs are our priority.  As part of our continuing mission to provide you with exceptional heart care, we have created designated Provider Care Teams.  These Care Teams include your primary Cardiologist (physician) and Advanced Practice Providers (APPs -  Physician Assistants and Nurse Practitioners) who all work together to provide you with the care you need, when you need it.     Your next appointment:   2 week(s)  The format for your next appointment:   In Person  Provider:    [facility]- Pharmacist  - follow up blood pressure and lipids    Other Instructions    Use blood pressure log -  bring with you to your appointment

## 2022-08-16 NOTE — Progress Notes (Addendum)
Anesthesia Review:  PCP: Kip Corrington LOVV 07/20/22  Cardiologist :  DR [place], [place] LOV 08/12/22  Chest x-ray : EKG : 02/08/22  Monitor0 2021  Echo : 2021  Stress test: 2019  Cardiac Cath :  Activity level:  can do a flight of stairs without difficulty  Sleep Study/ CPAP : none  Fasting Blood Sugar :      / Checks Blood Sugar -- times a day:   Blood Thinner/ Instructions /Last Dose: ASA / Instructions/ Last Dose :   81 mg Aspirin  Pharmacy in to reconcile meds at preop appt.  BMp done 08/19/22 routed to DR Supple.

## 2022-08-16 NOTE — Patient Instructions (Addendum)
SURGICAL WAITING ROOM VISITATION Patients having surgery or a procedure may have no more than 2 support people in the waiting area - these visitors may rotate.   Children under the age of 15 must have an adult with them who is not the patient. If the patient needs to stay at the hospital during part of their recovery, the visitor guidelines for inpatient rooms apply. Pre-op nurse will coordinate an appropriate time for 1 support person to accompany patient in pre-op.  This support person may not rotate.    Please refer to the [facility] website for the visitor guidelines for Inpatients (after your surgery is over and you are in a regular room).       Your procedure is scheduled on:  09/01/2022    Report to [facility] Main Entrance    Report to admitting at   1015AM   Call this number if you have problems the morning of surgery [phone]   Do not eat food :After Midnight.   After Midnight you may have the following liquids until ___ 0945___ AM DAY OF SURGERY  Water Non-Citrus Juices (without pulp, NO RED) Carbonated Beverages Black Coffee (NO MILK/CREAM OR CREAMERS, sugar ok)  Clear Tea (NO MILK/CREAM OR CREAMERS, sugar ok) regular and decaf                             Plain Jell-O (NO RED)                                           Fruit ices (not with fruit pulp, NO RED)                                     Popsicles (NO RED)                                                               Sports drinks like Gatorade (NO RED)      The day of surgery:  Drink ONE (1) Pre-Surgery Clear Ensure or G2 at    0945AM  ( have completed by ) the morning of surgery. Drink in one sitting. Do not sip.  This drink was given to you during your hospital  pre-op appointment visit. Nothing else to drink after completing the  Pre-Surgery Clear Ensure or G2.            Oral Hygiene is also important to reduce your risk of infection.                                    Remember -  BRUSH YOUR TEETH THE MORNING OF SURGERY WITH YOUR REGULAR TOOTHPASTE   Do NOT smoke after Midnight   Take these medicines the morning of surgery with A SIP OF WATER:  amlodipine, cymbalta   DO NOT TAKE ANY ORAL DIABETIC MEDICATIONS DAY OF YOUR SURGERY  Bring CPAP mask and tubing day of surgery.  You may not have any metal on your body including hair pins, jewelry, and body piercing             Do not wear make-up, lotions, powders, perfumes/cologne, or deodorant  Do not wear nail polish including gel and S&S, artificial/acrylic nails, or any other type of covering on natural nails including finger and toenails. If you have artificial nails, gel coating, etc. that needs to be removed by a nail salon please have this removed prior to surgery or surgery may need to be canceled/ delayed if the surgeon/ anesthesia feels like they are unable to be safely monitored.   Do not shave  48 hours prior to surgery.               Men may shave face and neck.   Do not bring valuables to the hospital. [place].   Contacts, dentures or bridgework may not be worn into surgery.   Bring small overnight bag day of surgery.   DO NOT [place]. PHARMACY WILL DISPENSE MEDICATIONS LISTED ON YOUR MEDICATION LIST TO YOU DURING YOUR ADMISSION [place]!    Patients discharged on the day of surgery will not be allowed to drive home.  Someone NEEDS to stay with you for the first 24 hours after anesthesia.   Special Instructions: Bring a copy of your healthcare power of attorney and living will documents         the day of surgery if you haven't scanned them before.              Please read over the following fact sheets you were given: IF YOU HAVE QUESTIONS ABOUT YOUR PRE-OP INSTRUCTIONS PLEASE CALL [phone]     [facility] - Preparing for Surgery Before surgery, you can play an important  role.  Because skin is not sterile, your skin needs to be as free of germs as possible.  You can reduce the number of germs on your skin by washing with CHG (chlorahexidine gluconate) soap before surgery.  CHG is an antiseptic cleaner which kills germs and bonds with the skin to continue killing germs even after washing. Please DO NOT use if you have an allergy to CHG or antibacterial soaps.  If your skin becomes reddened/irritated stop using the CHG and inform your nurse when you arrive at Short Stay. Do not shave (including legs and underarms) for at least 48 hours prior to the first CHG shower.  You may shave your face/neck. Please follow these instructions carefully:  1.  Shower with CHG Soap the night before surgery and the  morning of Surgery.  2.  If you choose to wash your hair, wash your hair first as usual with your  normal  shampoo.  3.  After you shampoo, rinse your hair and body thoroughly to remove the  shampoo.                           4.  Use CHG as you would any other liquid soap.  You can apply chg directly  to the skin and wash                       Gently with a scrungie or clean washcloth.  5.  Apply the CHG Soap to your  body ONLY FROM THE NECK DOWN.   Do not use on face/ open                           Wound or open sores. Avoid contact with eyes, ears mouth and genitals (private parts).                       Wash face,  Genitals (private parts) with your normal soap.             6.  Wash thoroughly, paying special attention to the area where your surgery  will be performed.  7.  Thoroughly rinse your body with warm water from the neck down.  8.  DO NOT shower/wash with your normal soap after using and rinsing off  the CHG Soap.                9.  Pat yourself dry with a clean towel.            10.  Wear clean pajamas.            11.  Place clean sheets on your bed the night of your first shower and do not  sleep with pets. Day of Surgery : Do not apply any lotions/deodorants  the morning of surgery.  Please wear clean clothes to the hospital/surgery center.  FAILURE TO FOLLOW THESE INSTRUCTIONS MAY RESULT IN THE CANCELLATION OF YOUR SURGERY PATIENT SIGNATURE_________________________________  NURSE SIGNATURE__________________________________ [place]- Preparing for Total Shoulder Arthroplasty    Before surgery, you can play an important role. Because skin is not sterile, your skin needs to be as free of germs as possible. You can reduce the number of germs on your skin by using the following products. Benzoyl Peroxide Gel Reduces the number of germs present on the skin Applied twice a day to shoulder area starting two days before surgery    ==================================================================  Please follow these instructions carefully:  BENZOYL PEROXIDE 5% GEL  Please do not use if you have an allergy to benzoyl peroxide.   If your skin becomes reddened/irritated stop using the benzoyl peroxide.  Starting two days before surgery, apply as follows: Apply benzoyl peroxide in the morning and at night. Apply after taking a shower. If you are not taking a shower clean entire shoulder front, back, and side along with the armpit with a clean wet washcloth.  Place a quarter-sized dollop on your shoulder and rub in thoroughly, making sure to cover the front, back, and side of your shoulder, along with the armpit.   2 days before ____ AM   ____ PM              1 day before ____ AM   ____ PM                         Do this twice a day for two days.  (Last application is the night before surgery, AFTER using the CHG soap as described below).  Do NOT apply benzoyl peroxide gel on the day of surgery.   ________________________________________________________________________

## 2022-08-19 ENCOUNTER — Other Ambulatory Visit: Payer: Self-pay

## 2022-08-19 ENCOUNTER — Encounter (HOSPITAL_COMMUNITY)
Admission: RE | Admit: 2022-08-19 | Discharge: 2022-08-19 | Disposition: A | Discharge status: Home / Self Care | Payer: Medicare Other | Source: Ambulatory Visit | Attending: Orthopedic Surgery | Admitting: Orthopedic Surgery

## 2022-08-19 ENCOUNTER — Encounter (HOSPITAL_COMMUNITY): Payer: Self-pay

## 2022-08-19 VITALS — BP 133/78 | HR 70 | Temp 98.1°F | Resp 16 | Ht 69.0 in | Wt 229.3 lb

## 2022-08-19 DIAGNOSIS — Z01812 Encounter for preprocedural laboratory examination: Secondary | ICD-10-CM | POA: Insufficient documentation

## 2022-08-19 DIAGNOSIS — Z01818 Encounter for other preprocedural examination: Secondary | ICD-10-CM

## 2022-08-19 DIAGNOSIS — N1831 Chronic kidney disease, stage 3a: Secondary | ICD-10-CM | POA: Diagnosis not present

## 2022-08-19 DIAGNOSIS — I129 Hypertensive chronic kidney disease with stage 1 through stage 4 chronic kidney disease, or unspecified chronic kidney disease: Secondary | ICD-10-CM | POA: Diagnosis not present

## 2022-08-19 DIAGNOSIS — I2510 Atherosclerotic heart disease of native coronary artery without angina pectoris: Secondary | ICD-10-CM | POA: Insufficient documentation

## 2022-08-19 DIAGNOSIS — Z951 Presence of aortocoronary bypass graft: Secondary | ICD-10-CM | POA: Insufficient documentation

## 2022-08-19 DIAGNOSIS — M19011 Primary osteoarthritis, right shoulder: Secondary | ICD-10-CM | POA: Insufficient documentation

## 2022-08-19 LAB — BASIC METABOLIC PANEL
Anion gap: 8 (ref 5–15)
BUN: 20 mg/dL (ref 6–20)
CO2: 28 mmol/L (ref 22–32)
Calcium: 9.1 mg/dL (ref 8.9–10.3)
Chloride: 104 mmol/L (ref 98–111)
Creatinine, Ser: 1.51 mg/dL — ABNORMAL HIGH (ref 0.61–1.24)
GFR, Estimated: 54 mL/min — ABNORMAL LOW (ref >60)
Glucose, Bld: 78 mg/dL (ref 70–99)
Potassium: 4.6 mmol/L (ref 3.5–5.1)
Sodium: 140 mmol/L (ref 135–145)

## 2022-08-19 LAB — CBC
HCT: 49.1 % (ref 39.0–52.0)
Hemoglobin: 15.9 g/dL (ref 13.0–17.0)
MCH: 27.5 pg (ref 26.0–34.0)
MCHC: 32.4 g/dL (ref 30.0–36.0)
MCV: 84.8 fL (ref 80.0–100.0)
Platelets: 277 10*3/uL (ref 150–400)
RBC: 5.79 MIL/uL (ref 4.22–5.81)
RDW: 14.1 % (ref 11.5–15.5)
WBC: 8 10*3/uL (ref 4.0–10.5)
nRBC: 0 % (ref 0.0–0.2)

## 2022-08-19 LAB — SURGICAL PCR SCREEN
MRSA, PCR: NEGATIVE
Staphylococcus aureus: NEGATIVE

## 2022-08-22 ENCOUNTER — Encounter (HOSPITAL_COMMUNITY): Payer: Self-pay | Admitting: Physician Assistant

## 2022-08-22 NOTE — Anesthesia Preprocedure Evaluation (Deleted)
Anesthesia Evaluation    Airway        Dental   Pulmonary           Cardiovascular hypertension,      Neuro/Psych    GI/Hepatic   Endo/Other    Renal/GU      Musculoskeletal   Abdominal   Peds  Hematology   Anesthesia Other Findings   Reproductive/Obstetrics                             Anesthesia Physical Anesthesia Plan  ASA:   Anesthesia Plan:    Post-op Pain Management:    Induction:   PONV Risk Score and Plan:   Airway Management Planned:   Additional Equipment:   Intra-op Plan:   Post-operative Plan:   Informed Consent:   Plan Discussed with:   Anesthesia Plan Comments: (See PAT note 08/19/2022)        Anesthesia Quick Evaluation

## 2022-08-22 NOTE — Progress Notes (Signed)
Anesthesia Chart Review   Case: [id number] Date/Time: 09/01/22 0955   Procedure: TOTAL SHOULDER ARTHROPLASTY (Right: Shoulder)   Anesthesia type: General   Pre-op diagnosis: Right shoulder osteoarthritis   [facility]: [facility] 06 / [facility]   Surgeons: Justice Britain, MD       DISCUSSION:56 y.o. never smoker with h/o PONV, HTN, CAD (CABG), CKD Stage G3a/A2, right shoulder OA scheduled for above procedure 09/01/2022 with Dr. Justice Britain.   Pt seen by cardiology 08/12/2022. Per OV note, "Pt has a history of CAD and CHF, EF normalized with revascularization. He can achieve greater than 4.0 METS without angina. He is euvolemic on exam. According to the RCRI, he has a 6.6% risk of MACE. He is acceptable to proceed with surgery without further cardiac workup. May hold ASA if necessary per surgeon"  Anticipate pt can proceed with planned procedure barring acute status change.   VS: BP 133/78   Pulse 70   Temp 36.7 C (Oral)   Resp 16   Ht '5\' 9"'$  (1.753 m)   Wt 104 kg   SpO2 96%   BMI 33.86 kg/m   PROVIDERS: Corrington, Kip A, MD is PCP   Cardiologist:  Pixie Casino, MD { LABS: Labs reviewed: Acceptable for surgery. (all labs ordered are listed, but only abnormal results are displayed)  Labs Reviewed  BASIC METABOLIC PANEL - Abnormal; Notable for the following components:      Result Value   Creatinine, Ser 1.51 (*)    GFR, Estimated 54 (*)    All other components within normal limits  SURGICAL PCR SCREEN  CBC     IMAGES:   EKG:   CV: Echo 09/02/2020 1. Left ventricular ejection fraction, by estimation, is 55 to 60%. The  left ventricle has normal function. The left ventricle has no regional  wall motion abnormalities. There is moderate left ventricular hypertrophy.  Left ventricular diastolic  parameters are consistent with Grade I diastolic dysfunction (impaired  relaxation).   2. Right ventricular systolic function is normal. The right ventricular  size is normal.   3.  The mitral valve is normal in structure. Trivial mitral valve  regurgitation. No evidence of mitral stenosis.   4. The aortic valve is normal in structure. Aortic valve regurgitation is  not visualized. No aortic stenosis is present.   5. The inferior vena cava is normal in size with greater than 50%  respiratory variability, suggesting right atrial pressure of 3 mmHg.   Myocardial Perfusion 03/16/2018 Nuclear stress EF: 46%. There was no ST segment deviation noted during stress. Findings consistent with prior myocardial infarction. This is an intermediate risk study. The left ventricular ejection fraction is mildly decreased (45-54%).    Moderate size inferior lateral wall infarct at mid and basal level No ischemia EF estimated at 46% Past Medical History:  Diagnosis Date   Arthritis    CAD (coronary artery disease)    a. 02/2014 Abnl MV with defect in LCX territory;  b. 02/2014 Cath: LAD 60-70p, 4m LCX 100 CTO, OM1 95p, 99d, RCA 100 CTO w/ L->R collaterals; c. 02/2014 CABG x 4 (LIMA->LAD, VG->OM, VG->RPDA->RPL); d. 01/2016 MV: 41%, med size, sev intensity fixed basal inferior lateral defect w/o ischemia.   CAD (coronary artery disease)    Carotid arterial disease ([place]    a. 06/2013 Carotid U/S: moderate narrowing of both subclavian arteries, with normal carotid arteries;  b. 02/2014 Carotid U/S: 1-39% bilat ICA stenosis.   Depression    Fracture  nasal bone   History of echocardiogram    a. 02/2016 Echo: EF 50-55%, no rwma, mildly dil LA, nl RV fxn, mildly dil RA.   History of kidney stones    Hyperlipidemia    Hypertension    Hypertensive heart disease    PONV (postoperative nausea and vomiting)    pt had problems with previous nerve block for 2 days post surgery- prefers no nerve blocks   Syndrome X, cardiac ([place])    a. Previously felt to have microvascular angina following remote cath in [place]-->treated with nitrates, ranexa, EECP.  Later found to have multivessel CAD.    Wears glasses     Past Surgical History:  Procedure Laterality Date   CARDIAC CATHETERIZATION  2005   CLOSED REDUCTION NASAL FRACTURE N/A 03/27/2020   Procedure: CLOSED REDUCTION NASAL BONES;  Surgeon: Irene Limbo, MD;  Location: [facility];  Service: Plastics;  Laterality: N/A;   COLONOSCOPY     CORONARY ARTERY BYPASS GRAFT N/A 03/18/2014   Procedure: CORONARY ARTERY BYPASS GRAFTING (CABG) x 4 using endoscopically harvested right saphenous vein and left internal mammary artery;  Surgeon: Gaye Pollack, MD;  Location: [facility];  Service: Open Heart Surgery;  Laterality: N/A;   HAND SURGERY Left    INTRAOPERATIVE TRANSESOPHAGEAL ECHOCARDIOGRAM N/A 03/18/2014   Procedure: INTRAOPERATIVE TRANSESOPHAGEAL ECHOCARDIOGRAM;  Surgeon: Gaye Pollack, MD;  Location: [facility];  Service: Open Heart Surgery;  Laterality: N/A;   KIDNEY STONE SURGERY  age 15   LEFT HEART CATHETERIZATION WITH CORONARY ANGIOGRAM N/A 03/14/2014   Procedure: LEFT HEART CATHETERIZATION WITH CORONARY ANGIOGRAM;  Surgeon: Troy Sine, MD;  Location: [facility] CATH LAB;  Service: Cardiovascular;  Laterality: N/A;   SHOULDER ARTHROSCOPY  2001   L shoulder   TOTAL HIP ARTHROPLASTY Left 10/26/2016   Procedure: LEFT TOTAL HIP ARTHROPLASTY ANTERIOR APPROACH;  Surgeon: Gaynelle Arabian, MD;  [facility];  Service: Orthopedics;  Laterality: Left;   TOTAL HIP ARTHROPLASTY Right 04/27/2022   Procedure: TOTAL HIP ARTHROPLASTY ANTERIOR APPROACH;  Surgeon: Gaynelle Arabian, MD;  [facility];  Service: Orthopedics;  Laterality: Right;    MEDICATIONS:  amLODipine (NORVASC) 5 MG tablet   amphetamine-dextroamphetamine (ADDERALL XR) 30 MG 24 hr capsule   aspirin EC 81 MG tablet   atorvastatin (LIPITOR) 80 MG tablet   DULoxetine (CYMBALTA) 60 MG capsule   ezetimibe (ZETIA) 10 MG tablet   liothyronine (CYTOMEL) 25 MCG tablet   REPATHA SURECLICK 416 MG/ML SOAJ   sacubitril-valsartan (ENTRESTO) 97-103 MG   sildenafil (VIAGRA) 50 MG tablet    testosterone cypionate (DEPOTESTOSTERONE CYPIONATE) 200 MG/ML injection   traMADol (ULTRAM) 50 MG tablet   zolpidem (AMBIEN) 10 MG tablet   cyclobenzaprine (FLEXERIL) 10 MG tablet   HYDROcodone-acetaminophen (NORCO/VICODIN) 5-325 MG tablet   No current facility-administered medications for this encounter.    Konrad Felix Ward, PA-C [facility] [phone]

## 2022-09-01 ENCOUNTER — Ambulatory Visit (HOSPITAL_COMMUNITY): Admission: RE | Admit: 2022-09-01 | Payer: Medicare Other | Source: Ambulatory Visit | Admitting: Orthopedic Surgery

## 2022-09-01 ENCOUNTER — Encounter (HOSPITAL_COMMUNITY): Admission: RE | Payer: Self-pay | Source: Ambulatory Visit

## 2022-09-01 SURGERY — ARTHROPLASTY, SHOULDER, TOTAL
Anesthesia: General | Site: Shoulder | Laterality: Right

## 2022-09-11 NOTE — Progress Notes (Deleted)
09/11/2022 Kenneth [name] 1966/11/16 [id number]   HPI:  Kenneth [name] is a 56 y.o. male patient of Dr Debara Pickett, with a PMH below who presents today for hypertension clinic evaluation.    Past Medical History: CHF Chromic combined  EF improved to 55-60%  CAD 2015 CABG x 4  Familial hyperlipidemia 1/22 - LDL 141 - on atorvastatin 80 and ezetimibe 10, (has been as high as 257); now also on Repatha           Blood Pressure Goal:  130/80  Current Medications:   amlodipine 5 mg qd, Entresto 97/103 mg qd  Family Hx:     Social Hx:      Diet:      Exercise:   Home BP readings:      Intolerances:    Labs: 08/19/22  Na 140, K 4.6, Glu 78, BUN 20, SCr 1.51, GFR 54   Wt Readings from Last 3 Encounters:  08/19/22 229 lb 4.5 oz (104 kg)  08/12/22 231 lb (104.8 kg)  04/27/22 222 lb 4 oz (100.8 kg)   BP Readings from Last 3 Encounters:  08/19/22 133/78  08/12/22 (!) 164/100  04/28/22 (!) 146/87   Pulse Readings from Last 3 Encounters:  08/19/22 70  08/12/22 68  04/28/22 97    Current Outpatient Medications  Medication Sig Dispense Refill   amLODipine (NORVASC) 5 MG tablet Take 1 tablet (5 mg total) by mouth daily. 90 tablet 3   amphetamine-dextroamphetamine (ADDERALL XR) 30 MG 24 hr capsule Take 30 mg by mouth daily.     aspirin EC 81 MG tablet Take 81 mg by mouth daily. Swallow whole.     atorvastatin (LIPITOR) 80 MG tablet Take 1 tablet (80 mg total) by mouth at bedtime. 90 tablet 3   cyclobenzaprine (FLEXERIL) 10 MG tablet Take 1 tablet (10 mg total) by mouth 3 (three) times daily as needed for muscle spasms. (Patient not taking: Reported on 08/19/2022) 40 tablet 0   DULoxetine (CYMBALTA) 60 MG capsule Take 60 mg by mouth daily.     ezetimibe (ZETIA) 10 MG tablet Take 1 tablet (10 mg total) by mouth daily. 90 tablet 3   HYDROcodone-acetaminophen (NORCO/VICODIN) 5-325 MG tablet Take 1-2 tablets by mouth every 6 (six) hours as needed for severe  pain. (Patient not taking: Reported on 08/19/2022) 42 tablet 0   liothyronine (CYTOMEL) 25 MCG tablet Take 25 mcg by mouth daily.     REPATHA SURECLICK 546 MG/ML SOAJ USE AS DIRECTED (Patient taking differently: Inject 140 mg into the skin every 14 (fourteen) days.) 2 mL 11   sacubitril-valsartan (ENTRESTO) 97-103 MG Take 1 tablet by mouth 2 (two) times daily. 60 tablet 3   sildenafil (VIAGRA) 50 MG tablet TAKE 1 TABLET BY MOUTH DAILY AS NEEDED FOR ERECTILE DYSFUNCTION 60 tablet 1   testosterone cypionate (DEPOTESTOSTERONE CYPIONATE) 200 MG/ML injection Inject 300 mg into the muscle every Wednesday.      traMADol (ULTRAM) 50 MG tablet Take 50 mg by mouth every 6 (six) hours as needed for moderate pain.     zolpidem (AMBIEN) 10 MG tablet Take 10 mg by mouth at bedtime.     No current facility-administered medications for this visit.    Allergies  Allergen Reactions   Tetracyclines & Related Nausea And Vomiting    Past Medical History:  Diagnosis Date   Arthritis    CAD (coronary artery disease)    a. 02/2014 Abnl MV with  defect in LCX territory;  b. 02/2014 Cath: LAD 60-70p, 92m LCX 100 CTO, OM1 95p, 99d, RCA 100 CTO w/ L->R collaterals; c. 02/2014 CABG x 4 (LIMA->LAD, VG->OM, VG->RPDA->RPL); d. 01/2016 MV: 41%, med size, sev intensity fixed basal inferior lateral defect w/o ischemia.   CAD (coronary artery disease)    Carotid arterial disease ([place]    a. 06/2013 Carotid U/S: moderate narrowing of both subclavian arteries, with normal carotid arteries;  b. 02/2014 Carotid U/S: 1-39% bilat ICA stenosis.   Depression    Fracture    nasal bone   History of echocardiogram    a. 02/2016 Echo: EF 50-55%, no rwma, mildly dil LA, nl RV fxn, mildly dil RA.   History of kidney stones    Hyperlipidemia    Hypertension    Hypertensive heart disease    PONV (postoperative nausea and vomiting)    pt had problems with previous nerve block for 2 days post surgery- prefers no nerve blocks   Syndrome X,  cardiac ([place]    a. Previously felt to have microvascular angina following remote cath in [place]-->treated with nitrates, ranexa, EECP.  Later found to have multivessel CAD.   Wears glasses     There were no vitals taken for this visit.  No BP recorded.  {Refresh Note OR Click here to enter BP  :1}***    No problem-specific Assessment & Plan notes found for this encounter.   KTommy MedalPharmD CPP CPark City[address]SLordsburgGDardenne [place] [phone]

## 2022-09-12 ENCOUNTER — Ambulatory Visit: Payer: Medicare Other

## 2022-09-26 ENCOUNTER — Ambulatory Visit: Payer: Medicare Other | Attending: Cardiovascular Disease

## 2022-09-26 NOTE — Progress Notes (Deleted)
09/26/2022 Kenneth [name] 27-Feb-1966 [id number]   HPI:  Kenneth [name] is a 56 y.o. male patient of Dr Debara Pickett, with a PMH below who presents today for hypertension clinic evaluation.  He was seen last month as follow up for elevated blood pressure.  He came to the office if February for preoperative cardiac evaluation and found to have a BP of 172/98.  Then last month he returned and pressure was still elevated at 164/100.  Patient reported hypertension for greater than 1 year, and feels symptomatic when it is elevated (surroundings are pulsating, dizziness, some blurry vision.  Amlodipine 5 mg daily was added to his regimen.   Today he returns for follow up.     Past Medical History: CAD 2015 CABG x 4   hyperlipidemia LDL on atorvastatin, ezetimibe, evolocumab  CHF 9/21 EF 55-60% by echo, previously 46% (2019) by stress test           Blood Pressure Goal:  130/80  Current Medications: amlodipine 5 mg qd, Entresto 97/103 bid,     Family Hx:     Social Hx:      Diet:      Exercise:   Home BP readings:      Intolerances:  beta blocker - fatigue  Labs: 08/19/22: Na 140, K 4.6, Glu 78, BUN 20, SCr 1.51, GFR 54  8/21 TC 346, TG 221, HDL 43, LDL 257  1/22:  TC 249, TG 319, HDL 50, LDL 141    Wt Readings from Last 3 Encounters:  08/19/22 229 lb 4.5 oz (104 kg)  08/12/22 231 lb (104.8 kg)  04/27/22 222 lb 4 oz (100.8 kg)   BP Readings from Last 3 Encounters:  08/19/22 133/78  08/12/22 (!) 164/100  04/28/22 (!) 146/87   Pulse Readings from Last 3 Encounters:  08/19/22 70  08/12/22 68  04/28/22 97    Current Outpatient Medications  Medication Sig Dispense Refill   amLODipine (NORVASC) 5 MG tablet Take 1 tablet (5 mg total) by mouth daily. 90 tablet 3   amphetamine-dextroamphetamine (ADDERALL XR) 30 MG 24 hr capsule Take 30 mg by mouth daily.     aspirin EC 81 MG tablet Take 81 mg by mouth daily. Swallow whole.     atorvastatin (LIPITOR)  80 MG tablet Take 1 tablet (80 mg total) by mouth at bedtime. 90 tablet 3   cyclobenzaprine (FLEXERIL) 10 MG tablet Take 1 tablet (10 mg total) by mouth 3 (three) times daily as needed for muscle spasms. (Patient not taking: Reported on 08/19/2022) 40 tablet 0   DULoxetine (CYMBALTA) 60 MG capsule Take 60 mg by mouth daily.     ezetimibe (ZETIA) 10 MG tablet Take 1 tablet (10 mg total) by mouth daily. 90 tablet 3   HYDROcodone-acetaminophen (NORCO/VICODIN) 5-325 MG tablet Take 1-2 tablets by mouth every 6 (six) hours as needed for severe pain. (Patient not taking: Reported on 08/19/2022) 42 tablet 0   liothyronine (CYTOMEL) 25 MCG tablet Take 25 mcg by mouth daily.     REPATHA SURECLICK 749 MG/ML SOAJ USE AS DIRECTED (Patient taking differently: Inject 140 mg into the skin every 14 (fourteen) days.) 2 mL 11   sacubitril-valsartan (ENTRESTO) 97-103 MG Take 1 tablet by mouth 2 (two) times daily. 60 tablet 3   sildenafil (VIAGRA) 50 MG tablet TAKE 1 TABLET BY MOUTH DAILY AS NEEDED FOR ERECTILE DYSFUNCTION 60 tablet 1   testosterone cypionate (DEPOTESTOSTERONE CYPIONATE) 200 MG/ML injection Inject  300 mg into the muscle every Wednesday.      traMADol (ULTRAM) 50 MG tablet Take 50 mg by mouth every 6 (six) hours as needed for moderate pain.     zolpidem (AMBIEN) 10 MG tablet Take 10 mg by mouth at bedtime.     No current facility-administered medications for this visit.    Allergies  Allergen Reactions   Tetracyclines & Related Nausea And Vomiting    Past Medical History:  Diagnosis Date   Arthritis    CAD (coronary artery disease)    a. 02/2014 Abnl MV with defect in LCX territory;  b. 02/2014 Cath: LAD 60-70p, 12m LCX 100 CTO, OM1 95p, 99d, RCA 100 CTO w/ L->R collaterals; c. 02/2014 CABG x 4 (LIMA->LAD, VG->OM, VG->RPDA->RPL); d. 01/2016 MV: 41%, med size, sev intensity fixed basal inferior lateral defect w/o ischemia.   CAD (coronary artery disease)    Carotid arterial disease ([place]    a.  06/2013 Carotid U/S: moderate narrowing of both subclavian arteries, with normal carotid arteries;  b. 02/2014 Carotid U/S: 1-39% bilat ICA stenosis.   Depression    Fracture    nasal bone   History of echocardiogram    a. 02/2016 Echo: EF 50-55%, no rwma, mildly dil LA, nl RV fxn, mildly dil RA.   History of kidney stones    Hyperlipidemia    Hypertension    Hypertensive heart disease    PONV (postoperative nausea and vomiting)    pt had problems with previous nerve block for 2 days post surgery- prefers no nerve blocks   Syndrome X, cardiac ([place]    a. Previously felt to have microvascular angina following remote cath in [place]-->treated with nitrates, ranexa, EECP.  Later found to have multivessel CAD.   Wears glasses     There were no vitals taken for this visit.  No BP recorded.  {Refresh Note OR Click here to enter BP  :1}***    No problem-specific Assessment & Plan notes found for this encounter.   Kenneth [name] CPP CSumner356 North DriveSSanta FeGKukuihaele [place] [phone]

## 2022-10-31 ENCOUNTER — Other Ambulatory Visit: Payer: Self-pay | Admitting: Nurse Practitioner

## 2023-02-07 ENCOUNTER — Telehealth: Payer: Self-pay

## 2023-02-07 NOTE — Telephone Encounter (Signed)
Pharmacy Patient Advocate Encounter  Prior Authorization for REPATHA 140 MG/ML INJ has been approved.    Effective dates: 02/07/23 through 02/08/24  Received notification from [place] that prior authorization for REPATHA 140 MG/ML INJ is needed.    PA submitted on 02/07/23 Key [id number] Status is pending  Karie Soda, [place] Patient Advocate Specialist Direct Number: [phone] Fax: [phone]

## 2023-04-21 ENCOUNTER — Other Ambulatory Visit: Payer: Self-pay | Admitting: Internal Medicine

## 2023-04-21 DIAGNOSIS — E785 Hyperlipidemia, unspecified: Secondary | ICD-10-CM

## 2023-04-21 DIAGNOSIS — I2510 Atherosclerotic heart disease of native coronary artery without angina pectoris: Secondary | ICD-10-CM

## 2023-07-19 ENCOUNTER — Other Ambulatory Visit: Payer: Self-pay

## 2023-07-19 MED ORDER — ENTRESTO 97-103 MG PO TABS: 1.0000 | ORAL_TABLET | Freq: Two times a day (BID) | ORAL | 1 refills | Status: DC

## 2023-08-18 ENCOUNTER — Other Ambulatory Visit: Payer: Self-pay

## 2023-08-18 MED ORDER — AMLODIPINE BESYLATE 5 MG PO TABS: 5.0000 mg | ORAL_TABLET | Freq: Every day | ORAL | 0 refills | Status: DC

## 2023-09-19 ENCOUNTER — Other Ambulatory Visit: Payer: Self-pay

## 2023-09-19 MED ORDER — AMLODIPINE BESYLATE 5 MG PO TABS
5.0000 mg | ORAL_TABLET | Freq: Every day | ORAL | 0 refills | Status: AC
Start: 2023-09-19 — End: ?

## 2023-12-04 NOTE — Progress Notes (Deleted)
Cardiology Office Note:    Date:  12/04/2023   ID:  Kenneth [name], DOB [birth year], MRN [id number]  PCP:  Vivien Presto, MD   [facility] Providers Cardiologist:  Chrystie Nose, MD { Click to update primary MD,subspecialty MD or APP then REFRESH:1}    Referring MD: Corrington, Kip A, MD   No chief complaint on file. ***  History of Present Illness:       Past Medical History:  Diagnosis Date   Arthritis    CAD (coronary artery disease)    a. 02/2014 Abnl MV with defect in LCX territory;  b. 02/2014 Cath: LAD 60-70p, 38m, LCX 100 CTO, OM1 95p, 99d, RCA 100 CTO w/ L->R collaterals; c. 02/2014 CABG x 4 (LIMA->LAD, VG->OM, VG->RPDA->RPL); d. 01/2016 MV: 41%, med size, sev intensity fixed basal inferior lateral defect w/o ischemia.   CAD (coronary artery disease)    Carotid arterial disease ([facility])    a. 06/2013 Carotid U/S: moderate narrowing of both subclavian arteries, with normal carotid arteries;  b. 02/2014 Carotid U/S: 1-39% bilat ICA stenosis.   Depression    Fracture    nasal bone   History of echocardiogram    a. 02/2016 Echo: EF 50-55%, no rwma, mildly dil LA, nl RV fxn, mildly dil RA.   History of kidney stones    Hyperlipidemia    Hypertension    Hypertensive heart disease    PONV (postoperative nausea and vomiting)    pt had problems with previous nerve block for 2 days post surgery- prefers no nerve blocks   Syndrome X, cardiac ([facility])    a. Previously felt to have microvascular angina following remote cath in [place]-->treated with nitrates, ranexa, EECP.  Later found to have multivessel CAD.   Wears glasses     Past Surgical History:  Procedure Laterality Date   CARDIAC CATHETERIZATION  2005   CLOSED REDUCTION NASAL FRACTURE N/A 03/27/2020   Procedure: CLOSED REDUCTION NASAL BONES;  Surgeon: Glenna Fellows, MD;  [facility];  Service: Plastics;  Laterality: N/A;   COLONOSCOPY     CORONARY ARTERY BYPASS GRAFT N/A 03/18/2014    Procedure: CORONARY ARTERY BYPASS GRAFTING (CABG) x 4 using endoscopically harvested right saphenous vein and left internal mammary artery;  Surgeon: Alleen Borne, MD;  [facility];  Service: Open Heart Surgery;  Laterality: N/A;   HAND SURGERY Left    INTRAOPERATIVE TRANSESOPHAGEAL ECHOCARDIOGRAM N/A 03/18/2014   Procedure: INTRAOPERATIVE TRANSESOPHAGEAL ECHOCARDIOGRAM;  Surgeon: Alleen Borne, MD;  Location[facility];  Service: Open Heart Surgery;  Laterality: N/A;   KIDNEY STONE SURGERY  age 12   LEFT HEART CATHETERIZATION WITH CORONARY ANGIOGRAM N/A 03/14/2014   Procedure: LEFT HEART CATHETERIZATION WITH CORONARY ANGIOGRAM;  Surgeon: Lennette Bihari, MD;  Location[facility] C[facility] LAB;  Service: Cardiovascular;  Laterality: N/A;   SHOULDER ARTHROSCOPY  2001   L shoulder   TOTAL HIP ARTHROPLASTY Left 10/26/2016   Procedure: LEFT TOTAL HIP ARTHROPLASTY ANTERIOR APPROACH;  Surgeon: Ollen Gross, MD;  [facility];  Service: Orthopedics;  Laterality: Left;   TOTAL HIP ARTHROPLASTY Right 04/27/2022   Procedure: TOTAL HIP ARTHROPLASTY ANTERIOR APPROACH;  Surgeon: Ollen Gross, MD;  [facility];  Service: Orthopedics;  Laterality: Right;    Current Medications: No outpatient medications have been marked as taking for the 12/14/23 encounter (Appointment) with Marcelino Duster, PA.     Allergies:   Tetracyclines & related   Social History   Socioeconomic History  Marital status: Married    Spouse name: Mary   Number of children: Not on file   Years of education: Not on file   Highest education level: Some college, no degree  Occupational History    Comment: self employed  Tobacco Use   Smoking status: Never   Smokeless tobacco: Never   Tobacco comments:    maybe 2 cigars a year  Vaping Use   Vaping status: Never Used  Substance and Sexual Activity   Alcohol use: Not Currently   Drug use: Not Currently   Sexual activity: Yes    Birth control/protection: None  Other  Topics Concern   Not on file  Social History Narrative   Lives with wife   [place], te[place]       Social Determinants of Health   Financial Resource Strain: Low Risk  (11/08/2023)   Received from [facility]   Overall Financial Resource Strain (CARDIA)    Difficulty of Paying Living Expenses: Not hard at all  Food Insecurity: Patient Declined (11/08/2023)   Received from [facility]   Hunger Vital Sign    Worried About Running Out of Food in the Last Year: Patient declined    Ran Out of Food in the Last Year: Patient declined  Transportation Needs: Patient Declined (11/08/2023)   Received from [facility] - Transportation    Lack of Transportation (Medical): Patient declined    Lack of Transportation (Non-Medical): Patient declined  Physical Activity: Unknown (11/08/2023)   Received from [facility]   Exercise Vital Sign    Days of Exercise per Week: Patient declined    Minutes of Exercise per Session: Not on file  Stress: Patient Declined (11/08/2023)   Received from [facility] - Occupational Stress Questionnaire    Feeling of Stress : Patient declined  Social Connections: Patient Declined (11/08/2023)   Received from [facility]   Social Network    How would you rate your social network (family, work, friends)?: Patient declined     Family History: The patient's ***family history includes Cancer in his father; Diabetes in his mother; Heart attack in his father and mother; Stroke in his mother.  ROS:   Please see the history of present illness.    *** All other systems reviewed and are negative.  EKGs/Labs/Other Studies Reviewed:    The following studies were reviewed today: ***      Recent Labs: No results found for requested labs within last 365 days.  Recent Lipid Panel    Component Value Date/Time   CHOL 346 (H) 08/24/2020 0905   CHOL 184 08/18/2015 1104   TRIG 221 (H) 08/24/2020 0905   TRIG  143 08/18/2015 1104   HDL 43 08/24/2020 0905   HDL 28 (L) 08/18/2015 1104   CHOLHDL 8.0 (H) 08/24/2020 0905   CHOLHDL 5.5 03/13/2014 1100   VLDL 22 03/13/2014 1100   LDLCALC 257 (H) 08/24/2020 0905   LDLCALC 127 (H) 08/18/2015 1104     Risk Assessment/Calculations:   {Does this patient have ATRIAL FIBRILLATION?:[id number]}  No BP recorded.  {Refresh Note OR Click here to enter BP  :1}***         Physical Exam:    VS:  There were no vitals taken for this visit.    Wt Readings from Last 3 Encounters:  08/19/22 229 lb 4.5 oz (104 kg)  08/12/22 231 lb (104.8 kg)  04/27/22 222 lb 4 oz (100.8 kg)  GEN: *** Well nourished, well developed in no acute distress HEENT: Normal NECK: No JVD; No carotid bruits LYMPHATICS: No lymphadenopathy CARDIAC: ***RRR, no murmurs, rubs, gallops RESPIRATORY:  Clear to auscultation without rales, wheezing or rhonchi  ABDOMEN: Soft, non-tender, non-distended MUSCULOSKELETAL:  No edema; No deformity  SKIN: Warm and dry NEUROLOGIC:  Alert and oriented x 3 PSYCHIATRIC:  Normal affect   ASSESSMENT:    No diagnosis found. PLAN:    In order of problems listed above:  ***      {Are you ordering a CV Procedure (e.g. stress test, cath, DCCV, TEE, etc)?   Press F2        :[id number]}    Medication Adjustments/Labs and Tests Ordered: Current medicines are reviewed at length with the patient today.  Concerns regarding medicines are outlined above.  No orders of the defined types were placed in this encounter.  No orders of the defined types were placed in this encounter.   There are no Patient Instructions on file for this visit.   Signed, Marcelino Duster, [place]  12/04/2023 9:52 AM    [facility]

## 2023-12-14 ENCOUNTER — Ambulatory Visit: Payer: Medicare Other | Attending: Physician Assistant | Admitting: Emergency Medicine

## 2023-12-14 NOTE — Progress Notes (Deleted)
  Cardiology Office Note:    Date:  12/14/2023  ID:  Kenneth [name], DOB November 28, 1966, MRN [id number] PCP: Vivien Presto, MD  [facility] Providers Cardiologist:  Chrystie Nose, MD { Click to update primary MD,subspecialty MD or APP then REFRESH:1}    {Click to Open Review  :1}   Patient Profile:      Kenneth [name] is a 57 year old male with a visit pertinent history of CABG x 4 (LIMA-LAD, SVG-OM, SVG-RPDA-RPL), hypertension, hyperlipidemia, carotid artery disease, chronic combined systolic and diastolic heart failure.  In 2015 heart catheterization showed multivessel disease leading to CABG x 4.  LVEF at that time was 40%.  He did have a low risk Myoview in 2017 and 2019.  Echocardiogram September 2021 for fatigue showed LVEF 55-60%, moderate LVH, grade 1 DD, no significant valvular disease.  Zio patch completed October 2021 for palpitations showed PACs and PVCs, no significant arrhythmias.  He was last seen by Micah Flesher, PA he was noted to be hypertensive 5 mg amlodipine was added to his medication regimen.      History of Present Illness:  Discussed the use of AI scribe software for clinical note transcription with the patient, who gave verbal consent to proceed.  Kenneth [name] is a 57 y.o. male who returns for ***Discussed the use of AI scribe software for clinical note transcription with the patient, who gave verbal consent to proceed.  History of Present Illness            ROS   See HPI ***    Studies Reviewed:       *** Risk Assessment/Calculations:   {Does this patient have ATRIAL FIBRILLATION?:[phone]} No BP recorded.  {Refresh Note OR Click here to enter BP  :1}***       Physical Exam:   VS:  There were no vitals taken for this visit.   Wt Readings from Last 3 Encounters:  08/19/22 229 lb 4.5 oz (104 kg)  08/12/22 231 lb (104.8 kg)  04/27/22 222 lb 4 oz (100.8 kg)    Physical Exam***     Assessment and Plan:   Assessment and Plan              {Are you ordering a CV Procedure (e.g. stress test, cath, DCCV, TEE, etc)?   Press F2        :[id number]}  Dispo:  No follow-ups on file.  Signed, Denyce Robert, NP

## 2023-12-21 ENCOUNTER — Ambulatory Visit: Payer: Medicare Other | Attending: Cardiology

## 2024-02-14 ENCOUNTER — Other Ambulatory Visit (HOSPITAL_COMMUNITY): Payer: Self-pay

## 2024-02-14 ENCOUNTER — Telehealth: Payer: Self-pay | Admitting: Pharmacy Technician

## 2024-02-14 NOTE — Telephone Encounter (Signed)
 Pharmacy Patient Advocate Encounter   Received notification from CoverMyMeds that prior authorization for Repatha is required/requested.   Insurance verification completed.   The patient is insured through [facility] .   Per test claim: PA required; PA submitted to above mentioned insurance via CoverMyMeds Key/confirmation #/EOC [id number] Status is pending

## 2024-02-14 NOTE — Telephone Encounter (Signed)
 Pharmacy Patient Advocate Encounter  Received notification from [facility] that Prior Authorization for repatha has been APPROVED from 02/14/24 to 02/13/25. Ran test claim, Copay is $377.70. This test claim was processed through [facility]- copay amounts may vary at other pharmacies due to pharmacy/plan contracts, or as the patient moves through the different stages of their insurance plan.  [facility] is closed so I was unable to ask them to fill it.   PA #/Case ID/Reference #: [id number]    Patient has a dedcutible

## 2024-02-15 NOTE — Telephone Encounter (Signed)
 Attempted to call patient, no answer left detailed voicemail regarding Repatha PA.

## 2024-02-16 NOTE — Telephone Encounter (Signed)
 Attempted to call patient x2, no answer, previous voicemail left

## 2024-02-19 NOTE — Telephone Encounter (Signed)
 3rd attempt to call patient, no answer, left message requesting a call back Nursing will await for patient to return call

## 2024-06-24 ENCOUNTER — Other Ambulatory Visit: Payer: Self-pay | Admitting: Internal Medicine

## 2024-06-24 DIAGNOSIS — I2510 Atherosclerotic heart disease of native coronary artery without angina pectoris: Secondary | ICD-10-CM

## 2024-06-24 DIAGNOSIS — E785 Hyperlipidemia, unspecified: Secondary | ICD-10-CM

## 2024-07-16 ENCOUNTER — Encounter: Payer: Self-pay | Admitting: *Deleted

## 2024-07-17 ENCOUNTER — Encounter: Payer: Self-pay | Admitting: Emergency Medicine

## 2024-07-18 ENCOUNTER — Ambulatory Visit: Admitting: Emergency Medicine

## 2024-07-19 ENCOUNTER — Other Ambulatory Visit: Payer: Self-pay | Admitting: Internal Medicine

## 2024-07-19 DIAGNOSIS — I2510 Atherosclerotic heart disease of native coronary artery without angina pectoris: Secondary | ICD-10-CM

## 2024-07-19 DIAGNOSIS — E785 Hyperlipidemia, unspecified: Secondary | ICD-10-CM

## 2024-08-13 ENCOUNTER — Other Ambulatory Visit: Payer: Self-pay | Admitting: Internal Medicine

## 2024-08-15 ENCOUNTER — Telehealth: Payer: Self-pay | Admitting: Pharmacy Technician

## 2024-08-15 ENCOUNTER — Other Ambulatory Visit (HOSPITAL_COMMUNITY): Payer: Self-pay

## 2024-08-15 NOTE — Telephone Encounter (Signed)
      Notified Designer, [employer]ellery

## 2024-08-18 ENCOUNTER — Other Ambulatory Visit: Payer: Self-pay | Admitting: Internal Medicine

## 2024-08-18 DIAGNOSIS — I2510 Atherosclerotic heart disease of native coronary artery without angina pectoris: Secondary | ICD-10-CM

## 2024-08-18 DIAGNOSIS — E785 Hyperlipidemia, unspecified: Secondary | ICD-10-CM

## 2024-08-18 NOTE — Progress Notes (Deleted)
 Cardiology Office Note    Date:  08/18/2024  ID:  Kenneth [name], DOB Jun 06, 1966, MRN [id number] PCP:  Bobbette Coye LABOR, MD  Cardiologist:  Vinie JAYSON Maxcy, MD  Electrophysiologist:  None   Chief Complaint: ***  History of Present Illness: .    Kenneth [name] is a 58 y.o. male with visit-pertinent history of CABG x 4 with LIMA to LAD, SVG to OM, SVG to RPDA-RPL, hypertension, hyperlipidemia, carotid artery disease, chronic combined systolic and diastolic heart failure.  In 2015 patient had cardiac catheterization which showed multivessel disease leading to CABG x 4.  LVEF at that time was 40%.  Echocardiogram in September 2021 for fatigue showed LVEF 55 to 60%, moderate LVH, grade 1 DD, no significant valvular disease.  Zio patch completed October 2020 under palpitation showed PACs and PVCs, no significant arrhythmias.  Patient was last seen in clinic on 08/12/2022 by Jon Hails, PA.  Patient reported increases in blood pressure, was started on amlodipine  5 mg daily.  Today he presents for delayed follow-up.  He reports that he   CAD: S/p CABG x 4 in 2015.  Echo in 2021 with improved EF 55 to 60%, moderate LVH, G1 DD Today he reports HTN: Blood pressure today Chronic combined systolic and diastolic heart failure: Improved EF on echocardiogram in 2021. Patient has previously been unable to tolerate beta-blocker due to fatigue and weakness. Continue Hyperlipidemia: Last lipid profile on   Labwork independently reviewed:   ROS: .   *** denies chest pain, shortness of breath, lower extremity edema, fatigue, palpitations, melena, hematuria, hemoptysis, diaphoresis, weakness, presyncope, syncope, orthopnea, and PND.  All other systems are reviewed and otherwise negative.  Studies Reviewed: SABRA    EKG:  EKG is ordered today, personally reviewed, demonstrating ***     CV Studies: Cardiac studies reviewed are outlined and summarized above. Otherwise please see  EMR for full report. Cardiac Studies & Procedures   ______________________________________________________________________________________________   STRESS TESTS  MYOCARDIAL PERFUSION IMAGING 03/16/2018  Interpretation Summary  Nuclear stress EF: 46%.  There was no ST segment deviation noted during stress.  Findings consistent with prior myocardial infarction.  This is an intermediate risk study.  The left ventricular ejection fraction is mildly decreased (45-54%).  Moderate size inferior lateral wall infarct at mid and basal level No ischemia EF estimated at 46%   ECHOCARDIOGRAM  ECHOCARDIOGRAM COMPLETE 09/02/2020  Narrative ECHOCARDIOGRAM REPORT    Patient Name:   Kenneth [name] Date of Exam: 09/02/2020 Medical Rec #:  [id number]                  Height:       69.0 in Accession #:    [id number]                 Weight:       222.8 lb Date of Birth:  [birth year]                  BSA:          2.163 m Patient Age:    53 years                   BP:           130/80 mmHg Patient Gender: M                          HR:  82 bpm. Exam Location:  [address]  Procedure: 2D Echo, Cardiac Doppler and Color Doppler  Indications:    R06.00 Dyspnea; R53.83 Fatigue; I25.5 Ischemic cardiomyopathy  History:        Patient has prior history of Echocardiogram examinations, most recent 01/31/2018. CHF, CAD, Prior CABG; Risk Factors:Hypertension and Dyslipidemia.  Sonographer:    Jon Hacker RCS Referring Phys: [id number] JESSE M CLEAVER  IMPRESSIONS   1. Left ventricular ejection fraction, by estimation, is 55 to 60%. The left ventricle has normal function. The left ventricle has no regional wall motion abnormalities. There is moderate left ventricular hypertrophy. Left ventricular diastolic parameters are consistent with Grade I diastolic dysfunction (impaired relaxation). 2. Right ventricular systolic function is normal. The right ventricular size is normal. 3.  The mitral valve is normal in structure. Trivial mitral valve regurgitation. No evidence of mitral stenosis. 4. The aortic valve is normal in structure. Aortic valve regurgitation is not visualized. No aortic stenosis is present. 5. The inferior vena cava is normal in size with greater than 50% respiratory variability, suggesting right atrial pressure of 3 mmHg.  Comparison(s): No significant change from prior study. Prior images reviewed side by side.  FINDINGS Left Ventricle: Left ventricular ejection fraction, by estimation, is 55 to 60%. The left ventricle has normal function. The left ventricle has no regional wall motion abnormalities. The left ventricular internal cavity size was normal in size. There is moderate left ventricular hypertrophy. Left ventricular diastolic parameters are consistent with Grade I diastolic dysfunction (impaired relaxation).  Right Ventricle: The right ventricular size is normal. No increase in right ventricular wall thickness. Right ventricular systolic function is normal.  Left Atrium: Left atrial size was normal in size.  Right Atrium: Right atrial size was normal in size.  Pericardium: There is no evidence of pericardial effusion.  Mitral Valve: The mitral valve is normal in structure. Mild mitral annular calcification. Trivial mitral valve regurgitation. No evidence of mitral valve stenosis.  Tricuspid Valve: The tricuspid valve is normal in structure. Tricuspid valve regurgitation is trivial. No evidence of tricuspid stenosis.  Aortic Valve: The aortic valve is normal in structure. Aortic valve regurgitation is not visualized. No aortic stenosis is present.  Pulmonic Valve: The pulmonic valve was normal in structure. Pulmonic valve regurgitation is not visualized. No evidence of pulmonic stenosis.  Aorta: The aortic root is normal in size and structure.  Venous: The inferior vena cava is normal in size with greater than 50% respiratory variability,  suggesting right atrial pressure of 3 mmHg.  IAS/Shunts: No atrial level shunt detected by color flow Doppler.   LEFT VENTRICLE PLAX 2D LVIDd:         4.70 cm  Diastology LVIDs:         3.30 cm  LV e' medial:    9.79 cm/s LV PW:         1.40 cm  LV E/e' medial:  7.1 LV IVS:        1.60 cm  LV e' lateral:   8.49 cm/s LVOT diam:     2.15 cm  LV E/e' lateral: 8.2 LV SV:         76 LV SV Index:   35 LVOT Area:     3.63 cm   RIGHT VENTRICLE RV Basal diam:  3.40 cm RV S prime:     8.25 cm/s TAPSE (M-mode): 0.8 cm  LEFT ATRIUM             Index  RIGHT ATRIUM           Index LA diam:        4.10 cm 1.90 cm/m  RA Area:     16.20 cm LA Vol (A2C):   48.8 ml 22.56 ml/m RA Volume:   43.70 ml  20.20 ml/m LA Vol (A4C):   39.1 ml 18.08 ml/m LA Biplane Vol: 44.8 ml 20.71 ml/m AORTIC VALVE LVOT Vmax:   114.00 cm/s LVOT Vmean:  69.900 cm/s LVOT VTI:    0.209 m  AORTA Ao Root diam: 3.60 cm  MITRAL VALVE MV Area (PHT): 3.27 cm    SHUNTS MV Decel Time: 232 msec    Systemic VTI:  0.21 m MV E velocity: 69.80 cm/s  Systemic Diam: 2.15 cm MV A velocity: 82.30 cm/s MV E/A ratio:  0.85  Oneil Parchment MD Electronically signed by Oneil Parchment MD Signature Date/Time: 09/02/2020/4:02:23 PM    Final    MONITORS  LONG TERM MONITOR (3-14 DAYS) 10/07/2020  Narrative Predominantly sinus rhythm with rare PAC's or PVC's - no significant arrhythmias.  Vinie KYM Maxcy, MD, [facility], FACP   [facility] Medical Director of the [facility] Diplomate of the American Board of Clinical Lipidology Attending Cardiologist Direct Dial: [phone]  Fax: [phone] Website:  [email or web address]       ______________________________________________________________________________________________       Current Reported Medications:.    No outpatient medications have been marked as taking for the 08/21/24 encounter  (Appointment) with Donnalee Cellucci D, NP.    Physical Exam:    VS:  There were no vitals taken for this visit.   Wt Readings from Last 3 Encounters:  08/19/22 229 lb 4.5 oz (104 kg)  08/12/22 231 lb (104.8 kg)  04/27/22 222 lb 4 oz (100.8 kg)    GEN: Well nourished, well developed in no acute distress NECK: No JVD; No carotid bruits CARDIAC: ***RRR, no murmurs, rubs, gallops RESPIRATORY:  Clear to auscultation without rales, wheezing or rhonchi  ABDOMEN: Soft, non-tender, non-distended EXTREMITIES:  No edema; No acute deformity     Asessement and Plan:.     ***     Disposition: F/u with ***  Signed, Starlit Raburn D Xochilth Standish, NP

## 2024-08-21 ENCOUNTER — Telehealth: Payer: Self-pay | Admitting: Pharmacy Technician

## 2024-08-21 ENCOUNTER — Ambulatory Visit: Attending: Cardiology | Admitting: Cardiology

## 2024-08-21 ENCOUNTER — Other Ambulatory Visit (HOSPITAL_COMMUNITY): Payer: Self-pay

## 2024-08-21 DIAGNOSIS — I2510 Atherosclerotic heart disease of native coronary artery without angina pectoris: Secondary | ICD-10-CM

## 2024-08-21 DIAGNOSIS — I10 Essential (primary) hypertension: Secondary | ICD-10-CM

## 2024-08-21 DIAGNOSIS — E785 Hyperlipidemia, unspecified: Secondary | ICD-10-CM

## 2024-08-21 NOTE — Telephone Encounter (Signed)
   Received another prior authorization request for generic entresto  but insurance said brand does not need a prior authorization. We both tried to bill brand and we get a reject saying the brand needs a prior auth and the generic is preferred. I ran generic and got a paid claim for 12.00. I spoke to [employer] and they said they want to wait until he is seen today for a new prescription for 30 days sent in since they can not break a bottle. They know the generic will go through per test claim

## 2024-08-28 ENCOUNTER — Other Ambulatory Visit: Payer: Self-pay

## 2024-09-04 NOTE — Progress Notes (Unsigned)
 Cardiology Office Note:    Date:  09/05/2024   ID:  Kenneth [name], DOB 1966-02-21, MRN [id number]  PCP:  Bobbette Coye LABOR, MD    [facility] Providers Cardiologist:  Vinie JAYSON Maxcy, MD     Referring MD: Bobbette Coye LABOR, MD   Chief complaint: Annual follow-up  History of Present Illness:    Kenneth [name] is a 58 y.o. male with a past medical history including CAD s/p CABG x4 (LIMA-LAD, SVG-OM, SVG to RPDA, and RPL), chronic combined systolic and diastolic heart failure, hypertension, hyperlipidemia, carotid artery disease, CVA, memory loss, ADHD, and arthritis.   Cardiac catheterization in 2015 showed multivessel disease s/p has a CABG x4 (LIMA-LAD, SVG-OM, SVG to RPDA, and RPL).  EF at the time was 41%.  Low risk Myoview  in 2017 and 2019.  Echocardiogram in September 2021 in the setting of fatigue showed EF 55 to 60%, moderate LVH, G1 DD, no significant change from prior study. Outpatient monitor in October 2021 in the setting of palpitations showed PACs and PVCs, no significant arrhythmias.  Last seen in our office on 08/12/2022 by Jon Hails PA for hypertension and preoperative evaluation for shoulder surgery, amlodipine  5 mg was added.  Patient reports new symptoms of occasional dizziness, nausea, fatigue intermittently over the last 6 months.  Also noticed a decreased exercise tolerance, and getting sore more easily.  Denies chest pain, shortness of breath, left arm pain that he felt with his previous ischemia, also denies near-syncope, DOE, orthopnea, palpitations, dark/tarry/bloody stools, leg swelling.  Reports he has not been compliant with all medications.  Stopped taking his aspirin  sometime earlier this year after prescription ran out, has had difficulty obtaining more Entresto , stopped taking that around 2 months ago.  Has not been taking his Lipitor  but states he has that at home.  Has remained compliant with amlodipine  and Repatha .  Is  currently retired, previously worked in [employer], political party, used to do martial arts frequently in his free time but has done that less this year after feeling more deconditioned.  Past Medical History:  Diagnosis Date   Arthritis    CAD (coronary artery disease)    a. 02/2014 Abnl MV with defect in LCX territory;  b. 02/2014 Cath: LAD 60-70p, 23m, LCX 100 CTO, OM1 95p, 99d, RCA 100 CTO w/ L->R collaterals; c. 02/2014 CABG x 4 (LIMA->LAD, VG->OM, VG->RPDA->RPL); d. 01/2016 MV: 41%, med size, sev intensity fixed basal inferior lateral defect w/o ischemia.   CAD (coronary artery disease)    Carotid arterial disease (HCC)    a. 06/2013 Carotid U/S: moderate narrowing of both subclavian arteries, with normal carotid arteries;  b. 02/2014 Carotid U/S: 1-39% bilat ICA stenosis.   Depression    Fracture    nasal bone   History of echocardiogram    a. 02/2016 Echo: EF 50-55%, no rwma, mildly dil LA, nl RV fxn, mildly dil RA.   History of kidney stones    Hyperlipidemia    Hypertension    Hypertensive heart disease    PONV (postoperative nausea and vomiting)    pt had problems with previous nerve block for 2 days post surgery- prefers no nerve blocks   Syndrome X, cardiac (HCC)    a. Previously felt to have microvascular angina following remote cath in [place] -->treated with nitrates, ranexa , EECP.  Later found to have multivessel CAD.   Wears glasses     Past Surgical History:  Procedure Laterality Date   CARDIAC CATHETERIZATION  2005   CLOSED REDUCTION NASAL FRACTURE N/A 03/27/2020   Procedure: CLOSED REDUCTION NASAL BONES;  Surgeon: Arelia Filippo, MD;  Location: [facility] OR;  Service: Plastics;  Laterality: N/A;   COLONOSCOPY     CORONARY ARTERY BYPASS GRAFT N/A 03/18/2014   Procedure: CORONARY ARTERY BYPASS GRAFTING (CABG) x 4 using endoscopically harvested right saphenous vein and left internal mammary artery;  Surgeon: Dorise MARLA Fellers, MD;  Location: [facility] OR;  Service: Open Heart Surgery;  Laterality: N/A;    HAND SURGERY Left    INTRAOPERATIVE TRANSESOPHAGEAL ECHOCARDIOGRAM N/A 03/18/2014   Procedure: INTRAOPERATIVE TRANSESOPHAGEAL ECHOCARDIOGRAM;  Surgeon: Dorise MARLA Fellers, MD;  Location: [facility] OR;  Service: Open Heart Surgery;  Laterality: N/A;   KIDNEY STONE SURGERY  age 59   LEFT HEART CATHETERIZATION WITH CORONARY ANGIOGRAM N/A 03/14/2014   Procedure: LEFT HEART CATHETERIZATION WITH CORONARY ANGIOGRAM;  Surgeon: Debby DELENA Sor, MD;  Location: [facility] CATH LAB;  Service: Cardiovascular;  Laterality: N/A;   SHOULDER ARTHROSCOPY  2001   L shoulder   TOTAL HIP ARTHROPLASTY Left 10/26/2016   Procedure: LEFT TOTAL HIP ARTHROPLASTY ANTERIOR APPROACH;  Surgeon: Dempsey Moan, MD;  Location: [facility] ORS;  Service: Orthopedics;  Laterality: Left;   TOTAL HIP ARTHROPLASTY Right 04/27/2022   Procedure: TOTAL HIP ARTHROPLASTY ANTERIOR APPROACH;  Surgeon: Moan Dempsey, MD;  Location: [facility] OR[facility];  Service: Orthopedics;  Laterality: Right;    Current Medications: Current Meds  Medication Sig   amLODipine  (NORVASC ) 5 MG tablet Take 1 tablet (5 mg total) by mouth daily.   aspirin  EC 81 MG tablet Take 81 mg by mouth daily. Swallow whole.   atorvastatin  (LIPITOR ) 80 MG tablet Take 1 tablet (80 mg total) by mouth at bedtime.   cyclobenzaprine  (FLEXERIL ) 10 MG tablet Take 1 tablet (10 mg total) by mouth 3 (three) times daily as needed for muscle spasms.   DULoxetine  (CYMBALTA ) 60 MG capsule Take 60 mg by mouth daily. (Patient taking differently: Take 120 mg by mouth daily.)   ezetimibe  (ZETIA ) 10 MG tablet Take 1 tablet (10 mg total) by mouth daily.   REPATHA  SURECLICK 140 MG/ML SOAJ INJECT 140 MG (THE CONTENTS OF 1 SYRINGE OR INJECTOR) UNDER THE SKIN EVERY 2 WEEKS   sildenafil  (VIAGRA ) 50 MG tablet TAKE 1 TABLET BY MOUTH DAILY AS NEEDED FOR ERECTILE DYSFUNCTION   valsartan  (DIOVAN ) 80 MG tablet Take 1 tablet (80 mg total) by mouth daily.   zolpidem  (AMBIEN ) 10 MG tablet Take 10 mg by mouth at bedtime.     Allergies:    Tetracyclines & related   Social History   Socioeconomic History   Marital status: Married    Spouse name: Mary   Number of children: Not on file   Years of education: Not on file   Highest education level: Some college, no degree  Occupational History    Comment: self employed  Tobacco Use   Smoking status: Never   Smokeless tobacco: Never   Tobacco comments:    maybe 2 cigars a year  Vaping Use   Vaping status: Never Used  Substance and Sexual Activity   Alcohol use: Not Currently   Drug use: Not Currently   Sexual activity: Yes    Birth control/protection: None  Other Topics Concern   Not on file  Social History Narrative   Lives with wife   [place], [place]       Social Drivers of Health   Financial Resource Strain: Patient Declined (02/16/2024)   Received from [facility]  Overall Financial Resource Strain (CARDIA)    Difficulty of Paying Living Expenses: Patient declined  Food Insecurity: Patient Declined (02/16/2024)   Received from [facility]   Hunger Vital Sign    Within the past 12 months, you worried that your food would run out before you got the money to buy more.: Patient declined    Within the past 12 months, the food you bought just didn't last and you didn't have money to get more.: Patient declined  Transportation Needs: Patient Declined (02/16/2024)   Received from [facility] - Transportation    Lack of Transportation (Medical): Patient declined    Lack of Transportation (Non-Medical): Patient declined  Physical Activity: Unknown (11/08/2023)   Received from [facility]   Exercise Vital Sign    On average, how many days per week do you engage in moderate to strenuous exercise (like a brisk walk)?: Patient declined    Minutes of Exercise per Session: Not on file  Stress: Patient Declined (11/08/2023)   Received from [facility] - Occupational Stress Questionnaire    Feeling of Stress :  Patient declined  Social Connections: Patient Declined (11/08/2023)   Received from [facility]   Social Network    How would you rate your social network (family, work, friends)?: Patient declined     Family History: The patient's family history includes Cancer in his father; Diabetes in his mother; Heart attack in his father and mother; Stroke in his mother.  ROS:   Please see the history of present illness.    All other systems reviewed and are negative.  EKGs/Labs/Other Studies Reviewed:    The following studies were reviewed today:  EKG Interpretation Date/Time:  Thursday September 05 2024 10:35:24 EDT Ventricular Rate:  61 PR Interval:  160 QRS Duration:  128 QT Interval:  428 QTC Calculation: 430 R Axis:   -8  Text Interpretation: Normal sinus rhythm Non-specific intra-ventricular conduction block Minimal voltage criteria for LVH, may be normal variant Confirmed by Alfretta Pinch [phone]) on 09/05/2024 10:44:23 AM    Recent Labs: No results found for requested labs within last 365 days.  Recent Lipid Panel    Component Value Date/Time   CHOL 346 (H) 08/24/2020 0905   CHOL 184 08/18/2015 1104   TRIG 221 (H) 08/24/2020 0905   TRIG 143 08/18/2015 1104   HDL 43 08/24/2020 0905   HDL 28 (L) 08/18/2015 1104   CHOLHDL 8.0 (H) 08/24/2020 0905   CHOLHDL 5.5 03/13/2014 1100   VLDL 22 03/13/2014 1100   LDLCALC 257 (H) 08/24/2020 0905   LDLCALC 127 (H) 08/18/2015 1104     Physical Exam:    VS:  BP (!) 149/92   Pulse 61   Ht 5' 9 (1.753 m)   Wt 245 lb 9.6 oz (111.4 kg)   SpO2 97%   BMI 36.27 kg/m     Wt Readings from Last 3 Encounters:  09/05/24 245 lb 9.6 oz (111.4 kg)  08/19/22 229 lb 4.5 oz (104 kg)  08/12/22 231 lb (104.8 kg)     GEN:  Well nourished, well developed in no acute distress HEENT: Normal NECK:  No carotid bruits CARDIAC: RRR, no murmurs, rubs, gallops RESPIRATORY:  Clear to auscultation without rales, wheezing or rhonchi   MUSCULOSKELETAL:  No edema; No deformity, 2+ PT bilaterally SKIN: Warm and dry NEUROLOGIC:  Alert and oriented x 3 PSYCHIATRIC:  Normal affect   ASSESSMENT:  1. Coronary artery disease involving native coronary artery of native heart without angina pectoris    PLAN:    In order of problems listed above:  CABG X4 EKG: Sinus rhythm, 61 bpm, non-specific intra-ventricular conduction block, no significant change from previous tracing Denies CP, SOB, left arm pain that was characteristic of his previous ischemic symptoms  Restart Aspirin  EC 81 mg  Restart atorvastatin  80 mg  Start Valsartan  80 mg, will reconsider starting Entresto  following echo result  Chronic combined heart failure  Reports occasional dizziness, nausea, and fatigue, no exacerbating factors Echo 08/2020: LVEF 55-60%, normal LV function, no RWMA, moderate LVH, G1 DD  NYHA class [name]  Appears euvolemic on exam, clear lung sounds, no LE edema  Will order repeat echo today Start Valsartan  80 mg, consider switching to entresto  49/51 (with plans to titrate up to previous dose) if EF decreased  HTN Reports concern for BPs in 140s-150s systolic, with diastolics around 100 when checking at home.   Had stopped his entresto  a few months ago  Will start on Valsartan  80 mg daily  Continue amlodipine  5 mg daily  Will recheck CMP in 1 week to evaluate kidney/liver function  HLD  Reports compliance with his repatha   States he will restart his atorvastatin  and zetia   Recommend fasting lipids in 2-3 months     Follow up with me or Dr. Mona in 2 months       Medication Adjustments/Labs and Tests Ordered: Current medicines are reviewed at length with the patient today.  Concerns regarding medicines are outlined above.  Orders Placed This Encounter  Procedures   Basic Metabolic Panel (BMET)   EKG 12-Lead   ECHOCARDIOGRAM COMPLETE   Meds ordered this encounter  Medications   valsartan  (DIOVAN ) 80 MG tablet    Sig:  Take 1 tablet (80 mg total) by mouth daily.    Dispense:  90 tablet    Refill:  3    Patient Instructions  Medication Instructions:  Your physician has recommended you make the following change in your medication:   START Valsartan  80 mg taking 1 daily  RESTART Aspirin  81 mg taking 1 daily  RESTART Atorvastatin  80 mg taking 1 daily  *If you need a refill on your cardiac medications before your next appointment, please call your pharmacy*  Lab Work: COME BACK FOR LAB WORK IN 1 WEEK, [facility] IS ON THE 1ST FLOOR:  BMET  If you have labs (blood work) drawn today and your tests are completely normal, you will receive your results only by: MyChart Message (if you have MyChart) OR A paper copy in the mail If you have any lab test that is abnormal or we need to change your treatment, we will call you to review the results.  Testing/Procedures: Your physician has requested that you have an echocardiogram. Echocardiography is a painless test that uses sound waves to create images of your heart. It provides your doctor with information about the size and shape of your heart and how well your heart's chambers and valves are working. This procedure takes approximately one hour. There are no restrictions for this procedure. Please do NOT wear cologne, perfume, aftershave, or lotions (deodorant is allowed). Please arrive 15 minutes prior to your appointment time.  Please note: We ask at that you not bring children with you during ultrasound (echo/ vascular) testing. Due to room size and safety concerns, children are not allowed in the ultrasound rooms during exams. Our front office staff cannot provide observation  of children in our lobby area while testing is being conducted. An adult accompanying a patient to their appointment will only be allowed in the ultrasound room at the discretion of the ultrasound technician under special circumstances. We apologize for any inconvenience.   Follow-Up: At  [facility], you and your health needs are our priority.  As part of our continuing mission to provide you with exceptional heart care, our providers are all part of one team.  This team includes your primary Cardiologist (physician) and Advanced Practice Providers or APPs (Physician Assistants and Nurse Practitioners) who all work together to provide you with the care you need, when you need it.  Your next appointment:   2 month(s)  Provider:   Miriam Shams, NP          We recommend signing up for the patient portal called MyChart.  Sign up information is provided on this After Visit Summary.  MyChart is used to connect with patients for Virtual Visits (Telemedicine).  Patients are able to view lab/test results, encounter notes, upcoming appointments, etc.  Non-urgent messages can be sent to your provider as well.   To learn more about what you can do with MyChart, go to [email or web address].   Other Instructions        Signed, Miriam FORBES Shams, NP  09/05/2024 3:32 PM    [facility]

## 2024-09-05 ENCOUNTER — Other Ambulatory Visit: Payer: Self-pay

## 2024-09-05 ENCOUNTER — Encounter: Payer: Self-pay | Admitting: Emergency Medicine

## 2024-09-05 ENCOUNTER — Ambulatory Visit: Attending: Physician Assistant | Admitting: Emergency Medicine

## 2024-09-05 VITALS — BP 149/92 | HR 61 | Ht 69.0 in | Wt 245.6 lb

## 2024-09-05 DIAGNOSIS — I10 Essential (primary) hypertension: Secondary | ICD-10-CM

## 2024-09-05 DIAGNOSIS — I5042 Chronic combined systolic (congestive) and diastolic (congestive) heart failure: Secondary | ICD-10-CM | POA: Diagnosis not present

## 2024-09-05 DIAGNOSIS — Z951 Presence of aortocoronary bypass graft: Secondary | ICD-10-CM

## 2024-09-05 DIAGNOSIS — E782 Mixed hyperlipidemia: Secondary | ICD-10-CM | POA: Diagnosis not present

## 2024-09-05 DIAGNOSIS — I2510 Atherosclerotic heart disease of native coronary artery without angina pectoris: Secondary | ICD-10-CM | POA: Diagnosis not present

## 2024-09-05 MED ORDER — VALSARTAN 80 MG PO TABS: 80.0000 mg | ORAL_TABLET | Freq: Every day | ORAL | 3 refills | Status: DC

## 2024-09-05 NOTE — Patient Instructions (Signed)
 Medication Instructions:  Your physician has recommended you make the following change in your medication:   START Valsartan  80 mg taking 1 daily  RESTART Aspirin  81 mg taking 1 daily  RESTART Atorvastatin  80 mg taking 1 daily  *If you need a refill on your cardiac medications before your next appointment, please call your pharmacy*  Lab Work: COME BACK FOR LAB WORK IN 1 WEEK, [facility] IS ON THE 1ST FLOOR:  BMET  If you have labs (blood work) drawn today and your tests are completely normal, you will receive your results only by: MyChart Message (if you have MyChart) OR A paper copy in the mail If you have any lab test that is abnormal or we need to change your treatment, we will call you to review the results.  Testing/Procedures: Your physician has requested that you have an echocardiogram. Echocardiography is a painless test that uses sound waves to create images of your heart. It provides your doctor with information about the size and shape of your heart and how well your heart's chambers and valves are working. This procedure takes approximately one hour. There are no restrictions for this procedure. Please do NOT wear cologne, perfume, aftershave, or lotions (deodorant is allowed). Please arrive 15 minutes prior to your appointment time.  Please note: We ask at that you not bring children with you during ultrasound (echo/ vascular) testing. Due to room size and safety concerns, children are not allowed in the ultrasound rooms during exams. Our front office staff cannot provide observation of children in our lobby area while testing is being conducted. An adult accompanying a patient to their appointment will only be allowed in the ultrasound room at the discretion of the ultrasound technician under special circumstances. We apologize for any inconvenience.   Follow-Up: At [facility], you and your health needs are our priority.  As part of our continuing mission to provide  you with exceptional heart care, our providers are all part of one team.  This team includes your primary Cardiologist (physician) and Advanced Practice Providers or APPs (Physician Assistants and Nurse Practitioners) who all work together to provide you with the care you need, when you need it.  Your next appointment:   2 month(s)  Provider:   Miriam Shams, NP          We recommend signing up for the patient portal called MyChart.  Sign up information is provided on this After Visit Summary.  MyChart is used to connect with patients for Virtual Visits (Telemedicine).  Patients are able to view lab/test results, encounter notes, upcoming appointments, etc.  Non-urgent messages can be sent to your provider as well.   To learn more about what you can do with MyChart, go to [email or web address].   Other Instructions

## 2024-09-12 ENCOUNTER — Telehealth: Payer: Self-pay | Admitting: Internal Medicine

## 2024-09-12 DIAGNOSIS — I5042 Chronic combined systolic (congestive) and diastolic (congestive) heart failure: Secondary | ICD-10-CM

## 2024-09-12 NOTE — Telephone Encounter (Signed)
  Patient states he changed his mind and would like a prescription for Entresto  now that he can get the generic. Please send to  [facility] [phone] - [place], [place] - [address]

## 2024-09-12 NOTE — Telephone Encounter (Signed)
 I spoke with patient.  He was previously on Entresto  and it was stopped due to cost.  He has received letter that generic Entresto  would be covered by his insurance.  He would like to go back to Entresto . Will send to Kenzie Campbell, NP to see if patient can be changed.

## 2024-09-16 ENCOUNTER — Other Ambulatory Visit: Payer: Self-pay | Admitting: Internal Medicine

## 2024-09-16 DIAGNOSIS — E785 Hyperlipidemia, unspecified: Secondary | ICD-10-CM

## 2024-09-16 DIAGNOSIS — I2510 Atherosclerotic heart disease of native coronary artery without angina pectoris: Secondary | ICD-10-CM

## 2024-09-16 NOTE — Telephone Encounter (Signed)
 Left voice message to call back 9/22

## 2024-09-20 MED ORDER — SACUBITRIL-VALSARTAN 49-51 MG PO TABS
1.0000 | ORAL_TABLET | Freq: Two times a day (BID) | ORAL | 3 refills | Status: AC
Start: 2024-09-20 — End: ?

## 2024-09-20 NOTE — Addendum Note (Signed)
 Addended by: Tynia Wiers W on: 09/20/2024 10:33 AM   Modules accepted: Orders

## 2024-09-20 NOTE — Telephone Encounter (Signed)
 Left detailed message for patient of recommendations. New script sent to the pharmacy and Lab orders mailed to the pt

## 2024-10-16 ENCOUNTER — Ambulatory Visit (HOSPITAL_COMMUNITY)
Admission: RE | Admit: 2024-10-16 | Discharge: 2024-10-16 | Disposition: A | Discharge status: Home / Self Care | Source: Ambulatory Visit | Attending: Cardiology | Admitting: Cardiology

## 2024-10-16 DIAGNOSIS — I2510 Atherosclerotic heart disease of native coronary artery without angina pectoris: Secondary | ICD-10-CM | POA: Insufficient documentation

## 2024-10-16 DIAGNOSIS — I517 Cardiomegaly: Secondary | ICD-10-CM | POA: Diagnosis not present

## 2024-10-16 DIAGNOSIS — I77810 Thoracic aortic ectasia: Secondary | ICD-10-CM | POA: Diagnosis not present

## 2024-10-16 DIAGNOSIS — Z951 Presence of aortocoronary bypass graft: Secondary | ICD-10-CM | POA: Insufficient documentation

## 2024-10-16 DIAGNOSIS — Z8249 Family history of ischemic heart disease and other diseases of the circulatory system: Secondary | ICD-10-CM | POA: Diagnosis not present

## 2024-10-16 DIAGNOSIS — R079 Chest pain, unspecified: Secondary | ICD-10-CM | POA: Diagnosis present

## 2024-10-16 LAB — ECHOCARDIOGRAM COMPLETE
Area-P 1/2: 2.37 cm2
S' Lateral: 3.4 cm

## 2024-10-18 ENCOUNTER — Ambulatory Visit: Payer: Self-pay | Admitting: Emergency Medicine

## 2024-10-18 DIAGNOSIS — I77819 Aortic ectasia, unspecified site: Secondary | ICD-10-CM

## 2024-10-21 NOTE — Telephone Encounter (Signed)
-----   Message from Miriam FORBES Shams sent at 10/18/2024  6:12 PM EDT ----- Your echo results look good.  Pumping function has mildly improved since your previous echo 4 years prior.  Diastolic dysfunction (impaired relaxation) is mild, mitral regurgitation is trivial, these  are stable, unchanged from prior study. Mild dilatation of the left atria (top chamber of your heart). There is new borderline dilatation of the ascending aorta measuring 39 mm.  These findings are  not concerning as they are borderline normal and will be something we continue to monitor with future imaging.  Overall, these results are reassuring, and can be further discussed at your follow-up  appointment in 2 weeks.  Take care ----- Message ----- From: Interface, [employer] Sent: 10/16/2024   4:52 PM EDT To: Kenzie E Campbell, NP

## 2024-10-21 NOTE — Telephone Encounter (Signed)
 Lvm of result sand keep  follow up   and clinic number if any questions

## 2024-10-30 ENCOUNTER — Ambulatory Visit: Payer: Self-pay | Admitting: Emergency Medicine

## 2024-10-30 LAB — BASIC METABOLIC PANEL WITH GFR
BUN/Creatinine Ratio: 17 (ref 10–24)
BUN: 22 mg/dL (ref 8–27)
CO2: 23 mmol/L (ref 20–29)
Calcium: 9.7 mg/dL (ref 8.6–10.2)
Chloride: 101 mmol/L (ref 96–106)
Creatinine, Ser: 1.27 mg/dL (ref 0.76–1.27)
Glucose: 116 mg/dL — ABNORMAL HIGH (ref 70–99)
Potassium: 4.1 mmol/L (ref 3.5–5.2)
Sodium: 140 mmol/L (ref 134–144)
eGFR: 65 mL/min/1.73 (ref >59)

## 2024-10-30 NOTE — Progress Notes (Deleted)
 Cardiology Office Note:    Date:  10/31/2024   ID:  Fairy Curtistine Canary [name], DOB 05/13/1966, MRN [id number]  PCP:  Bobbette Coye LABOR, MD   [facility] Providers Cardiologist:  Vinie JAYSON Maxcy, MD Cardiology APP:  Madie Jon Garre, PA { Click to update primary MD,subspecialty MD or APP then REFRESH:1}    Referring MD: Corrington, Coye LABOR, MD   Chief complaint: 30-month follow-up of CAD/HF     History of Present Illness:   Kenneth [name] is a 58 y.o. male with a hx of CAD s/p CABG x4 (LIMA-LAD, SVG-OM, SVG to RPDA, and RPL), chronic combined systolic and diastolic heart failure, hypertension, hyperlipidemia, carotid artery disease, CVA, memory loss, ADHD, and arthritis.   Cardiac catheterization in 2015 showed multivessel disease s/p has a CABG x4 (LIMA-LAD, SVG-OM, SVG to RPDA, and RPL).  EF at the time was 41%.  Low risk Myoview  in 2017 and 2019.  Echocardiogram in September 2021 in the setting of fatigue showed EF 55 to 60%, moderate LVH, G1 DD, no significant change from prior study. Outpatient monitor in October 2021 in the setting of palpitations showed PACs and PVCs, no significant arrhythmias.  Last seen in our office by me on 09/05/2024, reported medication noncompliance at that time.  Patient restarted aspirin , atorvastatin  at previous visit.  Reported regularly elevated BPs in the 140s-150s, started on valsartan  80 mg daily.  Echo ordered secondary to fatigue.  Echo 10/16/2024: LVEF 60-65%, normal LV function, no RWMA, G1 DD, RV mildly enlarged, RVSP 27.6 mmHg.  LA mildly dilated, trivial mitral valve regurgitation, normal aortic valve, borderline dilatation of ascending aorta measuring 39 mm.   ROS:   Please see the history of present illness.    *** All other systems reviewed and are negative.     Past Medical History:  Diagnosis Date   Arthritis    CAD (coronary artery disease)    a. 02/2014 Abnl MV with defect in LCX territory;  b. 02/2014 Cath:  LAD 60-70p, 66m, LCX 100 CTO, OM1 95p, 99d, RCA 100 CTO w/ L->R collaterals; c. 02/2014 CABG x 4 (LIMA->LAD, VG->OM, VG->RPDA->RPL); d. 01/2016 MV: 41%, med size, sev intensity fixed basal inferior lateral defect w/o ischemia.   CAD (coronary artery disease)    Carotid arterial disease    a. 06/2013 Carotid U/S: moderate narrowing of both subclavian arteries, with normal carotid arteries;  b. 02/2014 Carotid U/S: 1-39% bilat ICA stenosis.   Depression    Fracture    nasal bone   History of echocardiogram    a. 02/2016 Echo: EF 50-55%, no rwma, mildly dil LA, nl RV fxn, mildly dil RA.   History of kidney stones    Hyperlipidemia    Hypertension    Hypertensive heart disease    PONV (postoperative nausea and vomiting)    pt had problems with previous nerve block for 2 days post surgery- prefers no nerve blocks   Syndrome X, cardiac    a. Previously felt to have microvascular angina following remote cath in [place] -->treated with nitrates, ranexa , EECP.  Later found to have multivessel CAD.   Wears glasses     Past Surgical History:  Procedure Laterality Date   CARDIAC CATHETERIZATION  2005   CLOSED REDUCTION NASAL FRACTURE N/A 03/27/2020   Procedure: CLOSED REDUCTION NASAL BONES;  Surgeon: Arelia Filippo, MD;  Location: [facility] OR;  Service: Plastics;  Laterality: N/A;   COLONOSCOPY     CORONARY ARTERY BYPASS GRAFT N/A 03/18/2014  Procedure: CORONARY ARTERY BYPASS GRAFTING (CABG) x 4 using endoscopically harvested right saphenous vein and left internal mammary artery;  Surgeon: Dorise MARLA Fellers, MD;  Location: [facility] OR;  Service: Open Heart Surgery;  Laterality: N/A;   HAND SURGERY Left    INTRAOPERATIVE TRANSESOPHAGEAL ECHOCARDIOGRAM N/A 03/18/2014   Procedure: INTRAOPERATIVE TRANSESOPHAGEAL ECHOCARDIOGRAM;  Surgeon: Dorise MARLA Fellers, MD;  Location: [facility] OR;  Service: Open Heart Surgery;  Laterality: N/A;   KIDNEY STONE SURGERY  age 14   LEFT HEART CATHETERIZATION WITH CORONARY ANGIOGRAM N/A  03/14/2014   Procedure: LEFT HEART CATHETERIZATION WITH CORONARY ANGIOGRAM;  Surgeon: Debby DELENA Sor, MD;  Location: [facility] CATH LAB;  Service: Cardiovascular;  Laterality: N/A;   SHOULDER ARTHROSCOPY  2001   L shoulder   TOTAL HIP ARTHROPLASTY Left 10/26/2016   Procedure: LEFT TOTAL HIP ARTHROPLASTY ANTERIOR APPROACH;  Surgeon: Dempsey Moan, MD;  Location: [facility] ORS;  Service: Orthopedics;  Laterality: Left;   TOTAL HIP ARTHROPLASTY Right 04/27/2022   Procedure: TOTAL HIP ARTHROPLASTY ANTERIOR APPROACH;  Surgeon: Moan Dempsey, MD;  Location: [facility] ORS;  Service: Orthopedics;  Laterality: Right;    Current Medications: No outpatient medications have been marked as taking for the 10/31/24 encounter (Appointment) with Madie Jon Garre, PA.     Allergies:   Tetracyclines & related   Social History   Socioeconomic History   Marital status: Married    Spouse name: Mary   Number of children: Not on file   Years of education: Not on file   Highest education level: Some college, no degree  Occupational History    Comment: self employed  Tobacco Use   Smoking status: Never   Smokeless tobacco: Never   Tobacco comments:    maybe 2 cigars a year  Vaping Use   Vaping status: Never Used  Substance and Sexual Activity   Alcohol use: Not Currently   Drug use: Not Currently   Sexual activity: Yes    Birth control/protection: None  Other Topics Concern   Not on file  Social History Narrative   Lives with wife   [place], tead       Social Drivers of Health   Financial Resource Strain: Patient Declined (02/16/2024)   Received from [facility]   Overall Financial Resource Strain (CARDIA)    Difficulty of Paying Living Expenses: Patient declined  Food Insecurity: Patient Declined (02/16/2024)   Received from [facility]   Hunger Vital Sign    Within the past 12 months, you worried that your food would run out before you got the money to buy more.: Patient declined    Within the past 12  months, the food you bought just didn't last and you didn't have money to get more.: Patient declined  Transportation Needs: Patient Declined (02/16/2024)   Received from [facility] - Transportation    Lack of Transportation (Medical): Patient declined    Lack of Transportation (Non-Medical): Patient declined  Physical Activity: Unknown (11/08/2023)   Received from [facility]   Exercise Vital Sign    On average, how many days per week do you engage in moderate to strenuous exercise (like a brisk walk)?: Patient declined    Minutes of Exercise per Session: Not on file  Stress: Patient Declined (11/08/2023)   Received from [facility] - Occupational Stress Questionnaire    Feeling of Stress : Patient declined  Social Connections: Patient Declined (11/08/2023)   Received from [facility]  Social Network    How would you rate your social network (family, work, friends)?: Patient declined     Family History: The patient's ***family history includes Cancer in his father; Diabetes in his mother; Heart attack in his father and mother; Stroke in his mother.  EKGs/Labs/Other Studies Reviewed:    The following studies were reviewed today: ***      Recent Labs: 10/29/2024: BUN 22; Creatinine, Ser 1.27; Potassium 4.1; Sodium 140  Recent Lipid Panel    Component Value Date/Time   CHOL 346 (H) 08/24/2020 0905   CHOL 184 08/18/2015 1104   TRIG 221 (H) 08/24/2020 0905   TRIG 143 08/18/2015 1104   HDL 43 08/24/2020 0905   HDL 28 (L) 08/18/2015 1104   CHOLHDL 8.0 (H) 08/24/2020 0905   CHOLHDL 5.5 03/13/2014 1100   VLDL 22 03/13/2014 1100   LDLCALC 257 (H) 08/24/2020 0905   LDLCALC 127 (H) 08/18/2015 1104     Risk Assessment/Calculations:   {Does this patient have ATRIAL FIBRILLATION?[phone]}  No BP recorded.  {Refresh Note OR Click here to enter BP  :1}***         Physical Exam:    VS:  There were no vitals taken  for this visit.       Wt Readings from Last 3 Encounters:  09/05/24 245 lb 9.6 oz (111.4 kg)  08/19/22 229 lb 4.5 oz (104 kg)  08/12/22 231 lb (104.8 kg)     GEN: *** Well nourished, well developed in no acute distress HEENT: Normal NECK: No JVD; No carotid bruits CARDIAC: *** S1-S2 normal, RRR, no murmurs, rubs, gallops RESPIRATORY:  Clear to auscultation without rales, wheezing or rhonchi  MUSCULOSKELETAL:  No edema; No deformity  SKIN: Warm and dry NEUROLOGIC:  Alert and oriented x 3 PSYCHIATRIC:  Normal affect       Assessment & Plan Coronary artery disease involving native coronary artery of native heart without angina pectoris S/P CABG x 4 LIMA-LAD, SVG-OM, SVG to RPDA/RPL in 2015 EKG: Symptoms Continue aspirin  EC 81 mg daily Continue atorvastatin  80 mg daily Chronic combined systolic and diastolic heart failure (HCC) Echo 10/16/2024: LVEF 60-65%, normal LV function, no RWMA, G1 DD Symptoms Weight Volume status  Primary hypertension  Mixed hyperlipidemia        {Are you ordering a CV Procedure (e.g. stress test, cath, DCCV, TEE, etc)?   Press F2        :[id number]}    Medication Adjustments/Labs and Tests Ordered: Current medicines are reviewed at length with the patient today.  Concerns regarding medicines are outlined above.  No orders of the defined types were placed in this encounter.  No orders of the defined types were placed in this encounter.   There are no Patient Instructions on file for this visit.   Signed, Miriam FORBES Shams, NP  10/31/2024 6:53 AM    [facility]

## 2024-10-31 ENCOUNTER — Ambulatory Visit: Admitting: Physician Assistant

## 2024-10-31 NOTE — Assessment & Plan Note (Deleted)
 LIMA-LAD, SVG-OM, SVG to RPDA/RPL in 2015 EKG: Symptoms Continue aspirin  EC 81 mg daily Continue atorvastatin  80 mg daily

## 2024-10-31 NOTE — Telephone Encounter (Signed)
 Reached out to patient after he missed his appointment today to clarify whether he was taking entresto  or valsartan . Spoke mostly to patient's wife. Patient has been taking his entresto , not valsartan . Will remove valsartan  from med list. Reminded patient to make sure he reschedules his follow up appointment going forward.
